# Patient Record
Sex: Female | Born: 1940 | Race: White | Hispanic: No | Marital: Married | Smoking: Never smoker
Health system: Southern US, Community
[De-identification: ages and names within clinical notes are randomized; demographics above are authoritative.]

## PROBLEM LIST (undated history)

## (undated) DIAGNOSIS — R001 Bradycardia, unspecified: Secondary | ICD-10-CM

## (undated) DIAGNOSIS — E785 Hyperlipidemia, unspecified: Secondary | ICD-10-CM

## (undated) DIAGNOSIS — N950 Postmenopausal bleeding: Secondary | ICD-10-CM

## (undated) DIAGNOSIS — K8050 Calculus of bile duct without cholangitis or cholecystitis without obstruction: Secondary | ICD-10-CM

## (undated) DIAGNOSIS — I447 Left bundle-branch block, unspecified: Secondary | ICD-10-CM

## (undated) DIAGNOSIS — E669 Obesity, unspecified: Secondary | ICD-10-CM

## (undated) DIAGNOSIS — Z973 Presence of spectacles and contact lenses: Secondary | ICD-10-CM

## (undated) DIAGNOSIS — I509 Heart failure, unspecified: Secondary | ICD-10-CM

## (undated) DIAGNOSIS — H269 Unspecified cataract: Secondary | ICD-10-CM

## (undated) DIAGNOSIS — I4819 Other persistent atrial fibrillation: Secondary | ICD-10-CM

## (undated) DIAGNOSIS — N840 Polyp of corpus uteri: Secondary | ICD-10-CM

## (undated) DIAGNOSIS — I2699 Other pulmonary embolism without acute cor pulmonale: Secondary | ICD-10-CM

## (undated) DIAGNOSIS — Z86010 Personal history of colonic polyps: Secondary | ICD-10-CM

## (undated) DIAGNOSIS — I82409 Acute embolism and thrombosis of unspecified deep veins of unspecified lower extremity: Secondary | ICD-10-CM

## (undated) DIAGNOSIS — I440 Atrioventricular block, first degree: Secondary | ICD-10-CM

## (undated) DIAGNOSIS — I480 Paroxysmal atrial fibrillation: Secondary | ICD-10-CM

## (undated) DIAGNOSIS — R011 Cardiac murmur, unspecified: Secondary | ICD-10-CM

## (undated) DIAGNOSIS — R7301 Impaired fasting glucose: Secondary | ICD-10-CM

## (undated) DIAGNOSIS — I4510 Unspecified right bundle-branch block: Secondary | ICD-10-CM

## (undated) DIAGNOSIS — I4892 Unspecified atrial flutter: Secondary | ICD-10-CM

## (undated) DIAGNOSIS — K859 Acute pancreatitis without necrosis or infection, unspecified: Secondary | ICD-10-CM

## (undated) DIAGNOSIS — E039 Hypothyroidism, unspecified: Secondary | ICD-10-CM

## (undated) DIAGNOSIS — K5790 Diverticulosis of intestine, part unspecified, without perforation or abscess without bleeding: Secondary | ICD-10-CM

## (undated) DIAGNOSIS — Q438 Other specified congenital malformations of intestine: Secondary | ICD-10-CM

## (undated) DIAGNOSIS — I517 Cardiomegaly: Secondary | ICD-10-CM

## (undated) HISTORY — PX: PATELLA FRACTURE SURGERY: SHX735

## (undated) HISTORY — DX: Hyperlipidemia, unspecified: E78.5

## (undated) HISTORY — DX: Calculus of bile duct without cholangitis or cholecystitis without obstruction: K80.50

## (undated) HISTORY — DX: Other persistent atrial fibrillation: I48.19

## (undated) HISTORY — DX: Diverticulosis of intestine, part unspecified, without perforation or abscess without bleeding: K57.90

## (undated) HISTORY — DX: Impaired fasting glucose: R73.01

## (undated) HISTORY — DX: Left bundle-branch block, unspecified: I44.7

## (undated) HISTORY — DX: Other specified congenital malformations of intestine: Q43.8

## (undated) HISTORY — DX: Personal history of colonic polyps: Z86.010

## (undated) HISTORY — PX: JOINT REPLACEMENT: SHX530

## (undated) HISTORY — DX: Cardiac murmur, unspecified: R01.1

## (undated) HISTORY — DX: Unspecified cataract: H26.9

## (undated) HISTORY — PX: CATARACT EXTRACTION: SUR2

## (undated) HISTORY — PX: TONSILLECTOMY: SUR1361

## (undated) HISTORY — DX: Acute pancreatitis without necrosis or infection, unspecified: K85.90

---

## 1993-12-25 HISTORY — PX: TIBIA FRACTURE SURGERY: SHX806

## 1993-12-25 HISTORY — PX: SHOULDER OPEN ROTATOR CUFF REPAIR: SHX2407

## 1995-12-26 HISTORY — PX: TOTAL KNEE ARTHROPLASTY: SHX125

## 1995-12-26 HISTORY — PX: CHOLECYSTECTOMY: SHX55

## 2000-03-22 ENCOUNTER — Encounter: Payer: Self-pay | Admitting: Internal Medicine

## 2000-03-22 ENCOUNTER — Encounter: Admission: RE | Admit: 2000-03-22 | Discharge: 2000-03-22 | Payer: Self-pay | Admitting: Internal Medicine

## 2000-12-25 DIAGNOSIS — K5790 Diverticulosis of intestine, part unspecified, without perforation or abscess without bleeding: Secondary | ICD-10-CM

## 2000-12-25 DIAGNOSIS — Z860100 Personal history of colon polyps, unspecified: Secondary | ICD-10-CM

## 2000-12-25 HISTORY — PX: COLONOSCOPY: SHX174

## 2000-12-25 HISTORY — DX: Personal history of colon polyps, unspecified: Z86.0100

## 2000-12-25 HISTORY — DX: Diverticulosis of intestine, part unspecified, without perforation or abscess without bleeding: K57.90

## 2000-12-25 HISTORY — DX: Personal history of colonic polyps: Z86.010

## 2001-04-25 ENCOUNTER — Encounter: Payer: Self-pay | Admitting: Internal Medicine

## 2001-04-25 ENCOUNTER — Encounter: Admission: RE | Admit: 2001-04-25 | Discharge: 2001-04-25 | Payer: Self-pay | Admitting: *Deleted

## 2001-07-12 ENCOUNTER — Encounter (INDEPENDENT_AMBULATORY_CARE_PROVIDER_SITE_OTHER): Payer: Self-pay | Admitting: Specialist

## 2001-07-12 ENCOUNTER — Other Ambulatory Visit: Admission: RE | Admit: 2001-07-12 | Discharge: 2001-07-12 | Payer: Self-pay | Admitting: Gastroenterology

## 2001-07-30 ENCOUNTER — Encounter: Payer: Self-pay | Admitting: Gastroenterology

## 2001-07-30 ENCOUNTER — Ambulatory Visit (HOSPITAL_COMMUNITY): Admission: RE | Admit: 2001-07-30 | Discharge: 2001-07-30 | Payer: Self-pay | Admitting: Gastroenterology

## 2002-10-21 ENCOUNTER — Encounter: Admission: RE | Admit: 2002-10-21 | Discharge: 2002-10-21 | Payer: Self-pay | Admitting: Internal Medicine

## 2002-10-21 ENCOUNTER — Encounter: Payer: Self-pay | Admitting: Internal Medicine

## 2003-11-04 ENCOUNTER — Encounter: Admission: RE | Admit: 2003-11-04 | Discharge: 2003-11-04 | Payer: Self-pay | Admitting: Internal Medicine

## 2003-12-01 ENCOUNTER — Encounter: Payer: Self-pay | Admitting: Internal Medicine

## 2004-11-16 ENCOUNTER — Emergency Department (HOSPITAL_COMMUNITY): Admission: EM | Admit: 2004-11-16 | Discharge: 2004-11-17 | Payer: Self-pay | Admitting: Emergency Medicine

## 2004-11-28 ENCOUNTER — Encounter: Admission: RE | Admit: 2004-11-28 | Discharge: 2004-11-28 | Payer: Self-pay | Admitting: Internal Medicine

## 2004-11-28 ENCOUNTER — Ambulatory Visit: Payer: Self-pay | Admitting: Internal Medicine

## 2005-11-29 ENCOUNTER — Ambulatory Visit: Payer: Self-pay | Admitting: Internal Medicine

## 2005-12-19 ENCOUNTER — Encounter: Admission: RE | Admit: 2005-12-19 | Discharge: 2005-12-19 | Payer: Self-pay | Admitting: Internal Medicine

## 2006-09-25 ENCOUNTER — Ambulatory Visit: Payer: Self-pay | Admitting: Gastroenterology

## 2006-10-09 ENCOUNTER — Ambulatory Visit: Payer: Self-pay | Admitting: Gastroenterology

## 2006-12-03 ENCOUNTER — Ambulatory Visit: Payer: Self-pay | Admitting: Internal Medicine

## 2006-12-03 LAB — CONVERTED CEMR LAB
ALT: 17 units/L (ref 0–40)
AST: 23 units/L (ref 0–37)
Albumin: 4 g/dL (ref 3.5–5.2)
Alkaline Phosphatase: 80 units/L (ref 39–117)
BUN: 22 mg/dL (ref 6–23)
Basophils Absolute: 0 10*3/uL (ref 0.0–0.1)
Basophils Relative: 0.3 % (ref 0.0–1.0)
CO2: 27 meq/L (ref 19–32)
Calcium: 9.2 mg/dL (ref 8.4–10.5)
Chloride: 105 meq/L (ref 96–112)
Chol/HDL Ratio, serum: 4.2
Cholesterol: 202 mg/dL (ref 0–200)
Creatinine, Ser: 0.8 mg/dL (ref 0.4–1.2)
Creatinine,U: 208.7 mg/dL
Eosinophil percent: 2.5 % (ref 0.0–5.0)
Free T4: 0.8 ng/dL — ABNORMAL LOW (ref 0.9–1.8)
GFR calc non Af Amer: 76 mL/min
Glomerular Filtration Rate, Af Am: 92 mL/min/{1.73_m2}
Glucose, Bld: 82 mg/dL (ref 70–99)
HCT: 41.2 % (ref 36.0–46.0)
HDL: 48.6 mg/dL (ref >39.0)
Hemoglobin: 13.8 g/dL (ref 12.0–15.0)
Hgb A1c MFr Bld: 5.5 % (ref 4.6–6.0)
LDL DIRECT: 157.5 mg/dL
Lymphocytes Relative: 14.1 % (ref 12.0–46.0)
MCHC: 33.4 g/dL (ref 30.0–36.0)
MCV: 89.3 fL (ref 78.0–100.0)
Microalb Creat Ratio: 3.8 mg/g (ref 0.0–30.0)
Microalb, Ur: 0.8 mg/dL (ref 0.0–1.9)
Monocytes Absolute: 0.3 10*3/uL (ref 0.2–0.7)
Monocytes Relative: 4.1 % (ref 3.0–11.0)
Neutro Abs: 5.9 10*3/uL (ref 1.4–7.7)
Neutrophils Relative %: 79 % — ABNORMAL HIGH (ref 43.0–77.0)
Platelets: 355 10*3/uL (ref 150–400)
Potassium: 3.7 meq/L (ref 3.5–5.1)
RBC: 4.61 M/uL (ref 3.87–5.11)
RDW: 12.3 % (ref 11.5–14.6)
Sodium: 141 meq/L (ref 135–145)
TSH: 3.62 microintl units/mL (ref 0.35–5.50)
Total Bilirubin: 0.9 mg/dL (ref 0.3–1.2)
Total Protein: 7.7 g/dL (ref 6.0–8.3)
Triglyceride fasting, serum: 56 mg/dL (ref 0–149)
VLDL: 11 mg/dL (ref 0–40)
WBC: 7.4 10*3/uL (ref 4.5–10.5)

## 2006-12-20 ENCOUNTER — Encounter: Admission: RE | Admit: 2006-12-20 | Discharge: 2006-12-20 | Payer: Self-pay | Admitting: Internal Medicine

## 2007-12-04 ENCOUNTER — Ambulatory Visit: Payer: Self-pay | Admitting: Internal Medicine

## 2007-12-17 ENCOUNTER — Encounter (INDEPENDENT_AMBULATORY_CARE_PROVIDER_SITE_OTHER): Payer: Self-pay | Admitting: *Deleted

## 2007-12-23 ENCOUNTER — Encounter: Admission: RE | Admit: 2007-12-23 | Discharge: 2007-12-23 | Payer: Self-pay | Admitting: Internal Medicine

## 2007-12-26 HISTORY — PX: TOTAL KNEE ARTHROPLASTY: SHX125

## 2007-12-31 ENCOUNTER — Encounter: Admission: RE | Admit: 2007-12-31 | Discharge: 2007-12-31 | Payer: Self-pay | Admitting: Internal Medicine

## 2008-01-03 ENCOUNTER — Encounter (INDEPENDENT_AMBULATORY_CARE_PROVIDER_SITE_OTHER): Payer: Self-pay | Admitting: *Deleted

## 2008-03-13 ENCOUNTER — Ambulatory Visit: Payer: Self-pay | Admitting: Internal Medicine

## 2008-03-13 DIAGNOSIS — E785 Hyperlipidemia, unspecified: Secondary | ICD-10-CM

## 2008-03-13 DIAGNOSIS — R7989 Other specified abnormal findings of blood chemistry: Secondary | ICD-10-CM | POA: Insufficient documentation

## 2008-03-13 DIAGNOSIS — M1990 Unspecified osteoarthritis, unspecified site: Secondary | ICD-10-CM | POA: Insufficient documentation

## 2008-03-13 DIAGNOSIS — H5702 Anisocoria: Secondary | ICD-10-CM | POA: Insufficient documentation

## 2008-03-15 LAB — CONVERTED CEMR LAB
ALT: 23 units/L (ref 0–35)
AST: 24 units/L (ref 0–37)
Albumin: 4.2 g/dL (ref 3.5–5.2)
Alkaline Phosphatase: 69 units/L (ref 39–117)
BUN: 23 mg/dL (ref 6–23)
Basophils Absolute: 0.1 10*3/uL (ref 0.0–0.1)
Basophils Relative: 0.7 % (ref 0.0–1.0)
Bilirubin, Direct: 0.1 mg/dL (ref 0.0–0.3)
CO2: 27 meq/L (ref 19–32)
Calcium: 9.4 mg/dL (ref 8.4–10.5)
Chloride: 108 meq/L (ref 96–112)
Cholesterol: 186 mg/dL (ref 0–200)
Creatinine, Ser: 0.7 mg/dL (ref 0.4–1.2)
Eosinophils Absolute: 0.2 10*3/uL (ref 0.0–0.6)
Eosinophils Relative: 2.6 % (ref 0.0–5.0)
Free T4: 0.9 ng/dL (ref 0.6–1.6)
GFR calc Af Amer: 107 mL/min
GFR calc non Af Amer: 89 mL/min
Glucose, Bld: 105 mg/dL — ABNORMAL HIGH (ref 70–99)
HCT: 38.6 % (ref 36.0–46.0)
HDL: 52.9 mg/dL (ref >39.0)
Hemoglobin: 12.9 g/dL (ref 12.0–15.0)
Hgb A1c MFr Bld: 5.5 % (ref 4.6–6.0)
LDL Cholesterol: 119 mg/dL — ABNORMAL HIGH (ref 0–99)
Lymphocytes Relative: 14.5 % (ref 12.0–46.0)
MCHC: 33.4 g/dL (ref 30.0–36.0)
MCV: 89.6 fL (ref 78.0–100.0)
Monocytes Absolute: 0.5 10*3/uL (ref 0.2–0.7)
Monocytes Relative: 6.4 % (ref 3.0–11.0)
Neutro Abs: 6 10*3/uL (ref 1.4–7.7)
Neutrophils Relative %: 75.8 % (ref 43.0–77.0)
Platelets: 339 10*3/uL (ref 150–400)
Potassium: 4 meq/L (ref 3.5–5.1)
RBC: 4.31 M/uL (ref 3.87–5.11)
RDW: 13.9 % (ref 11.5–14.6)
Sodium: 141 meq/L (ref 135–145)
TSH: 3.51 microintl units/mL (ref 0.35–5.50)
Total Bilirubin: 0.6 mg/dL (ref 0.3–1.2)
Total CHOL/HDL Ratio: 3.5
Total Protein: 7.3 g/dL (ref 6.0–8.3)
Triglycerides: 72 mg/dL (ref 0–149)
VLDL: 14 mg/dL (ref 0–40)
WBC: 8 10*3/uL (ref 4.5–10.5)

## 2008-03-16 ENCOUNTER — Encounter (INDEPENDENT_AMBULATORY_CARE_PROVIDER_SITE_OTHER): Payer: Self-pay | Admitting: *Deleted

## 2008-03-31 ENCOUNTER — Telehealth: Payer: Self-pay | Admitting: Internal Medicine

## 2008-04-01 ENCOUNTER — Ambulatory Visit: Payer: Self-pay | Admitting: Internal Medicine

## 2008-04-03 LAB — CONVERTED CEMR LAB: Vit D, 1,25-Dihydroxy: 30 (ref 30–89)

## 2008-04-06 ENCOUNTER — Encounter (INDEPENDENT_AMBULATORY_CARE_PROVIDER_SITE_OTHER): Payer: Self-pay | Admitting: *Deleted

## 2008-04-27 ENCOUNTER — Encounter: Payer: Self-pay | Admitting: Internal Medicine

## 2008-08-03 ENCOUNTER — Inpatient Hospital Stay (HOSPITAL_COMMUNITY): Admission: RE | Admit: 2008-08-03 | Discharge: 2008-08-06 | Payer: Self-pay | Admitting: Orthopedic Surgery

## 2008-08-28 ENCOUNTER — Ambulatory Visit: Payer: Self-pay

## 2008-10-26 ENCOUNTER — Ambulatory Visit: Payer: Self-pay | Admitting: Obstetrics and Gynecology

## 2008-10-26 ENCOUNTER — Encounter: Payer: Self-pay | Admitting: Obstetrics and Gynecology

## 2008-10-26 ENCOUNTER — Other Ambulatory Visit: Admission: RE | Admit: 2008-10-26 | Discharge: 2008-10-26 | Payer: Self-pay | Admitting: Obstetrics and Gynecology

## 2008-10-26 ENCOUNTER — Telehealth (INDEPENDENT_AMBULATORY_CARE_PROVIDER_SITE_OTHER): Payer: Self-pay | Admitting: *Deleted

## 2008-12-23 ENCOUNTER — Encounter: Admission: RE | Admit: 2008-12-23 | Discharge: 2008-12-23 | Payer: Self-pay | Admitting: Internal Medicine

## 2008-12-29 ENCOUNTER — Encounter: Admission: RE | Admit: 2008-12-29 | Discharge: 2008-12-29 | Payer: Self-pay | Admitting: Internal Medicine

## 2008-12-31 ENCOUNTER — Encounter: Payer: Self-pay | Admitting: Internal Medicine

## 2009-03-02 ENCOUNTER — Ambulatory Visit: Payer: Self-pay | Admitting: Internal Medicine

## 2009-03-02 DIAGNOSIS — G479 Sleep disorder, unspecified: Secondary | ICD-10-CM

## 2009-03-02 DIAGNOSIS — E559 Vitamin D deficiency, unspecified: Secondary | ICD-10-CM

## 2009-03-08 ENCOUNTER — Encounter (INDEPENDENT_AMBULATORY_CARE_PROVIDER_SITE_OTHER): Payer: Self-pay | Admitting: *Deleted

## 2009-03-08 LAB — CONVERTED CEMR LAB
ALT: 14 units/L (ref 0–35)
AST: 20 units/L (ref 0–37)
Albumin: 4.1 g/dL (ref 3.5–5.2)
Alkaline Phosphatase: 60 units/L (ref 39–117)
BUN: 17 mg/dL (ref 6–23)
Basophils Absolute: 0 10*3/uL (ref 0.0–0.1)
Basophils Relative: 0.1 % (ref 0.0–3.0)
Bilirubin, Direct: 0.1 mg/dL (ref 0.0–0.3)
CO2: 30 meq/L (ref 19–32)
Calcium: 9.6 mg/dL (ref 8.4–10.5)
Chloride: 105 meq/L (ref 96–112)
Cholesterol: 190 mg/dL (ref 0–200)
Creatinine, Ser: 0.7 mg/dL (ref 0.4–1.2)
Eosinophils Absolute: 0.1 10*3/uL (ref 0.0–0.7)
Eosinophils Relative: 2.4 % (ref 0.0–5.0)
GFR calc Af Amer: 107 mL/min
GFR calc non Af Amer: 88 mL/min
Glucose, Bld: 101 mg/dL — ABNORMAL HIGH (ref 70–99)
HCT: 39.4 % (ref 36.0–46.0)
HDL: 51.4 mg/dL (ref >39.0)
Hemoglobin: 13.4 g/dL (ref 12.0–15.0)
LDL Cholesterol: 120 mg/dL — ABNORMAL HIGH (ref 0–99)
Lymphocytes Relative: 17.4 % (ref 12.0–46.0)
MCHC: 34.1 g/dL (ref 30.0–36.0)
MCV: 90.3 fL (ref 78.0–100.0)
Monocytes Absolute: 0.4 10*3/uL (ref 0.1–1.0)
Monocytes Relative: 6.4 % (ref 3.0–12.0)
Neutro Abs: 4.4 10*3/uL (ref 1.4–7.7)
Neutrophils Relative %: 73.7 % (ref 43.0–77.0)
Platelets: 309 10*3/uL (ref 150–400)
Potassium: 4.4 meq/L (ref 3.5–5.1)
RBC: 4.36 M/uL (ref 3.87–5.11)
RDW: 12.8 % (ref 11.5–14.6)
Sodium: 142 meq/L (ref 135–145)
TSH: 3.51 microintl units/mL (ref 0.35–5.50)
Total Bilirubin: 1 mg/dL (ref 0.3–1.2)
Total CHOL/HDL Ratio: 3.7
Total Protein: 7.3 g/dL (ref 6.0–8.3)
Triglycerides: 91 mg/dL (ref 0–149)
VLDL: 18 mg/dL (ref 0–40)
Vit D, 25-Hydroxy: 43 ng/mL (ref 30–89)
WBC: 5.9 10*3/uL (ref 4.5–10.5)

## 2009-12-27 ENCOUNTER — Encounter: Admission: RE | Admit: 2009-12-27 | Discharge: 2009-12-27 | Payer: Self-pay | Admitting: Internal Medicine

## 2010-04-04 ENCOUNTER — Ambulatory Visit: Payer: Self-pay | Admitting: Family Medicine

## 2010-04-04 ENCOUNTER — Encounter: Payer: Self-pay | Admitting: Internal Medicine

## 2010-05-02 ENCOUNTER — Ambulatory Visit: Payer: Self-pay | Admitting: Internal Medicine

## 2010-05-02 DIAGNOSIS — K5730 Diverticulosis of large intestine without perforation or abscess without bleeding: Secondary | ICD-10-CM | POA: Insufficient documentation

## 2010-05-02 LAB — CONVERTED CEMR LAB
Cholesterol, target level: 200 mg/dL
HDL goal, serum: 40 mg/dL
LDL Goal: 130 mg/dL

## 2010-05-03 LAB — CONVERTED CEMR LAB: Vit D, 25-Hydroxy: 56 ng/mL (ref 30–89)

## 2010-05-08 LAB — CONVERTED CEMR LAB
ALT: 16 units/L (ref 0–35)
AST: 21 units/L (ref 0–37)
Albumin: 4.4 g/dL (ref 3.5–5.2)
Alkaline Phosphatase: 79 units/L (ref 39–117)
BUN: 26 mg/dL — ABNORMAL HIGH (ref 6–23)
Basophils Absolute: 0 10*3/uL (ref 0.0–0.1)
Basophils Relative: 0.5 % (ref 0.0–3.0)
Bilirubin, Direct: 0.1 mg/dL (ref 0.0–0.3)
CO2: 30 meq/L (ref 19–32)
Calcium: 9.7 mg/dL (ref 8.4–10.5)
Chloride: 104 meq/L (ref 96–112)
Cholesterol: 209 mg/dL — ABNORMAL HIGH (ref 0–200)
Creatinine, Ser: 0.7 mg/dL (ref 0.4–1.2)
Direct LDL: 130 mg/dL
Eosinophils Absolute: 0.2 10*3/uL (ref 0.0–0.7)
Eosinophils Relative: 2.2 % (ref 0.0–5.0)
GFR calc non Af Amer: 82.49 mL/min (ref >60)
Glucose, Bld: 98 mg/dL (ref 70–99)
HCT: 40 % (ref 36.0–46.0)
HDL: 62.3 mg/dL (ref >39.00)
Hemoglobin: 13.4 g/dL (ref 12.0–15.0)
Lymphocytes Relative: 18.4 % (ref 12.0–46.0)
Lymphs Abs: 1.3 10*3/uL (ref 0.7–4.0)
MCHC: 33.6 g/dL (ref 30.0–36.0)
MCV: 91.9 fL (ref 78.0–100.0)
Monocytes Absolute: 0.3 10*3/uL (ref 0.1–1.0)
Monocytes Relative: 4.3 % (ref 3.0–12.0)
Neutro Abs: 5.4 10*3/uL (ref 1.4–7.7)
Neutrophils Relative %: 74.6 % (ref 43.0–77.0)
Platelets: 367 10*3/uL (ref 150.0–400.0)
Potassium: 5 meq/L (ref 3.5–5.1)
RBC: 4.35 M/uL (ref 3.87–5.11)
RDW: 13.5 % (ref 11.5–14.6)
Sodium: 143 meq/L (ref 135–145)
TSH: 3.25 microintl units/mL (ref 0.35–5.50)
Total Bilirubin: 0.5 mg/dL (ref 0.3–1.2)
Total CHOL/HDL Ratio: 3
Total Protein: 7.6 g/dL (ref 6.0–8.3)
Triglycerides: 52 mg/dL (ref 0.0–149.0)
VLDL: 10.4 mg/dL (ref 0.0–40.0)
WBC: 7.2 10*3/uL (ref 4.5–10.5)

## 2010-10-10 ENCOUNTER — Telehealth: Payer: Self-pay | Admitting: Internal Medicine

## 2010-11-21 ENCOUNTER — Ambulatory Visit: Payer: Self-pay | Admitting: Internal Medicine

## 2010-11-21 ENCOUNTER — Telehealth: Payer: Self-pay | Admitting: Internal Medicine

## 2010-11-21 LAB — CONVERTED CEMR LAB
BUN: 17 mg/dL (ref 6–23)
CO2: 26 meq/L (ref 19–32)
Calcium: 9.1 mg/dL (ref 8.4–10.5)
Chloride: 104 meq/L (ref 96–112)
Creatinine, Ser: 0.8 mg/dL (ref 0.4–1.2)
GFR calc non Af Amer: 77.5 mL/min (ref >60)
Glucose, Bld: 102 mg/dL — ABNORMAL HIGH (ref 70–99)
Potassium: 4.3 meq/L (ref 3.5–5.1)
Sodium: 141 meq/L (ref 135–145)

## 2010-11-22 ENCOUNTER — Ambulatory Visit: Payer: Self-pay | Admitting: Internal Medicine

## 2010-11-22 DIAGNOSIS — R17 Unspecified jaundice: Secondary | ICD-10-CM | POA: Insufficient documentation

## 2010-11-23 LAB — CONVERTED CEMR LAB
ALT: 15 units/L (ref 0–35)
AST: 19 units/L (ref 0–37)
Albumin: 3.8 g/dL (ref 3.5–5.2)
Alkaline Phosphatase: 80 units/L (ref 39–117)
Basophils Absolute: 0 10*3/uL (ref 0.0–0.1)
Basophils Relative: 0.4 % (ref 0.0–3.0)
Bilirubin, Direct: 0.2 mg/dL (ref 0.0–0.3)
Eosinophils Absolute: 0.1 10*3/uL (ref 0.0–0.7)
Eosinophils Relative: 1.6 % (ref 0.0–5.0)
HCT: 38.8 % (ref 36.0–46.0)
Hemoglobin: 13.1 g/dL (ref 12.0–15.0)
Lymphocytes Relative: 17.9 % (ref 12.0–46.0)
Lymphs Abs: 1.3 10*3/uL (ref 0.7–4.0)
MCHC: 33.9 g/dL (ref 30.0–36.0)
MCV: 89.6 fL (ref 78.0–100.0)
Monocytes Absolute: 0.6 10*3/uL (ref 0.1–1.0)
Monocytes Relative: 8.1 % (ref 3.0–12.0)
Neutro Abs: 5.2 10*3/uL (ref 1.4–7.7)
Neutrophils Relative %: 72 % (ref 43.0–77.0)
Platelets: 324 10*3/uL (ref 150.0–400.0)
RBC: 4.33 M/uL (ref 3.87–5.11)
RDW: 13.6 % (ref 11.5–14.6)
Total Bilirubin: 0.4 mg/dL (ref 0.3–1.2)
Total Protein: 6.8 g/dL (ref 6.0–8.3)
WBC: 7.3 10*3/uL (ref 4.5–10.5)

## 2010-12-28 ENCOUNTER — Encounter
Admission: RE | Admit: 2010-12-28 | Discharge: 2010-12-28 | Payer: Self-pay | Source: Home / Self Care | Attending: Internal Medicine | Admitting: Internal Medicine

## 2010-12-28 LAB — HM MAMMOGRAPHY

## 2011-01-15 ENCOUNTER — Encounter: Payer: Self-pay | Admitting: Internal Medicine

## 2011-01-24 NOTE — Progress Notes (Signed)
Summary: Anti-malaria  Phone Note Call from Patient Call back at Home Phone [phone]   Caller: Patient Summary of Call: pt called about an anti-malaria drug. please contact pt. she said this is an rx. Initial call taken by: Lavell Islam,  October 10, 2010 1:03 PM  Follow-up for Phone Call        Patient was notified by her pharmacy that it was Lariam 250mg , and she will need 9 pills so she can take one per week. Patient will be going to [place].  She uses [facility]. Ok to provide, please advise. Lucious Groves CMA  October 10, 2010 4:39 PM   Additional Follow-up for Phone Call Additional follow up Details #1::        see Rx Additional Follow-up by: Marga Melnick MD,  October 10, 2010 5:44 PM    Additional Follow-up for Phone Call Additional follow up Details #2::    Faxed to pharmacy while patient was there. Follow-up by: Lucious Groves CMA,  October 11, 2010 2:17 PM  New/Updated Medications: MEFLOQUINE HCL 250 MG TABS (MEFLOQUINE HCL) 1 weekly beginning 1 week prior to arrival & for 4 weeks after leaving endemic area Prescriptions: MEFLOQUINE HCL 250 MG TABS (MEFLOQUINE HCL) 1 weekly beginning 1 week prior to arrival & for 4 weeks after leaving endemic area  #9 x 0   Entered and Authorized by:   Marga Melnick MD   Signed by:   Marga Melnick MD on 10/10/2010   Method used:   Printed then faxed to ...       [facility] [id number]* (retail)       [address] [place], [place]  [address]       Ph: [phone]       Fax: [phone]   RxID:   [id number]
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
## 2011-01-24 NOTE — Assessment & Plan Note (Signed)
Summary: ACUTE DIARRHEA AFTER TRAVELING TO [place]   Vital Signs:  Patient profile:   70 year old female Temp:     97.6 degrees F Pulse rate:   60 / minute Resp:     17 per minute BP supine:   124 / 80  Vitals Entered By: Shonna Chock CMA (November 22, 2010 11:44 AM) CC: Diarrhea: patient recently traveled to [place] and now with diarrhea    CC:  Diarrhea: patient recently traveled to [place] and now with diarrhea .  History of Present Illness: Diarrhea      This is a 70 year old woman who presents with Diarrhea intermittently (X 4) since 10/25/2010 . First 2 episodes were while in [place]; the last episodes since  return to [place] 11/21.  The patient reports up to 25  stools per day, watery/unformed stools, "moderately" voluminous stools, fecal urgency, fecal soiling, nocturnal diarrhea, fasting diarrhea, and abrupt onset of symptoms. She  denies blood in stool, mucus in stool, greasy stools, malodorous stools, alternating diarrhea/constipation, bloating, and gassiness.  Associated symptoms include weight loss of 13 #.  The patient denies fever, abdominal pain, abdominal cramps, nausea, vomiting, lightheadedness, increased thirst, joint pains, mouth ulcers, and eye redness.  The symptoms are better with antibiotics. She took 4 courses of Cipro 500 mg (bid X 2 days then 1 daily) . last 2& 1/2 day  course was completed 11/28.  Patient's risk factors for diarrhea include eating  [place]  food and international travel.  Patient has  no   history of  GI disease other than incidentally noted diverticulosis @ colonoscopy.  Current Medications (verified): 1)  Asa 81mg  2)  Ambien 10  Mg  Tabs (Zolpidem Tartrate) .Marland Kitchen.. 1 By Mouth Every 3rd Night As Needed 3)  Vitamin D3 1000 Unit Tabs (Cholecalciferol) .Marland Kitchen.. 1 By Mouth Two Times A Day 4)  Multivitamins  Tabs (Multiple Vitamin) .Marland Kitchen.. 1 By Mouth Once Daily  Allergies: 1)  ! Pcn 2)  ! Demerol  Physical Exam  General:  well-nourished,in no acute distress;  alert,appropriate and cooperative throughout examination Eyes:  No corneal or conjunctival inflammation noted.? faint icterus Mouth:  Oral mucosa and oropharynx without lesions or exudates.  Teeth in good repair.  No pharyngeal erythema.  Tongue moist Lungs:  Normal respiratory effort, chest expands symmetrically. Lungs are clear to auscultation, no crackles or wheezes. Heart:  normal rate, regular rhythm, no gallop, no rub, no JVD, no HJR, and grade 1 /6 systolic murmur LSB.   Abdomen:  Bowel sounds  hyperactivwe,abdomen soft and non-tender without masses, organomegaly or hernias noted. Extremities:  No clubbing, cyanosis, edema. Neurologic:  alert & oriented X3.   Skin:  Intact without suspicious lesions or rashes. No jaundice.Some tenting  Cervical Nodes:  No lymphadenopathy noted Axillary Nodes:  No palpable lymphadenopathy Psych:  memory intact for recent and remote, normally interactive, and good eye contact.     Impression & Recommendations:  Problem # 1:  DIARRHEA (ICD-787.91)  post international travel  Orders: Venipuncture (16109) TLB-CBC Platelet - w/Differential (85025-CBCD) TLB-Hepatic/Liver Function Pnl (80076-HEPATIC)  Problem # 2:  ICTERUS (ICD-782.4)  questionable  Orders: Venipuncture (60454) TLB-CBC Platelet - w/Differential (85025-CBCD) TLB-Hepatic/Liver Function Pnl (80076-HEPATIC)  Complete Medication List: 1)  Asa 81mg   2)  Ambien 10 Mg Tabs (zolpidem Tartrate)  .Marland Kitchen.. 1 by mouth every 3rd night as needed 3)  Vitamin D3 1000 Unit Tabs (Cholecalciferol) .Marland Kitchen.. 1 by mouth two times a day 4)  Multivitamins Tabs (Multiple vitamin) .Marland KitchenMarland KitchenMarland Kitchen  1 by mouth once daily 5)  Metronidazole 500 Mg Tabs (Metronidazole) .Marland Kitchen.. 1 three times a day 6)  Ciprofloxacin Hcl 500 Mg Tabs (Ciprofloxacin hcl) .Marland Kitchen.. 1 two times a day  Patient Instructions: 1)  Fill Rx for Lomotil if needed.Drink clear liquids only for the next 24 hours, then slowly add other liquids and food as you   tolerate them. Align daily until bowels are normal. Prescriptions: AMBIEN 10  MG  TABS (ZOLPIDEM TARTRATE) 1 by mouth every 3rd night as needed  #30 x 3   Entered and Authorized by:   Marga Melnick MD   Signed by:   Marga Melnick MD on 11/22/2010   Method used:   Print then Give to Patient   RxID:   [id number] CIPROFLOXACIN HCL 500 MG TABS (CIPROFLOXACIN HCL) 1 two times a day  #14 x 0   Entered and Authorized by:   Marga Melnick MD   Signed by:   Marga Melnick MD on 11/22/2010   Method used:   Faxed to ...       [facility]* (retail)       [address]       [place], [place]  [id number]       Ph: [phone]       Fax: [phone]   RxID:   [id number] METRONIDAZOLE 500 MG TABS (METRONIDAZOLE) 1 three times a day  #21 x 0   Entered and Authorized by:   Marga Melnick MD   Signed by:   Marga Melnick MD on 11/22/2010   Method used:   Faxed to ...       [facility]* (retail)       [address]       [place], [place]  [id number]       Ph: [phone]       Fax: [phone]   RxID:   [id number]    Orders Added: 1)  Est. Patient Level IV [85462] 2)  Venipuncture [70350] 3)  TLB-CBC Platelet - w/Differential [85025-CBCD] 4)  TLB-Hepatic/Liver Function Pnl [80076-HEPATIC]

## 2011-01-24 NOTE — Progress Notes (Signed)
Summary: diarrhea  Phone Note Call from Patient Call back at Home Phone [phone]   Summary of Call: Patient called stating that she has gone to [place] and since has been having terrible bouts of diarrhea. She has started her third bout of diarrhea and has taken her husbands cipro with the previous two bouts. Patient would like something called in, I made her aware this may require office visit. Please advise. Initial call taken by: Lucious Groves CMA,  November 21, 2010 9:05 AM  Follow-up for Phone Call        Per MD the patient needs: office visit with bmet, stool check-- o&p and culture.  Patient notified and will come today for lab visit and scheduled office visit for tomorrow. Follow-up by: Lucious Groves CMA,  November 21, 2010 9:48 AM

## 2011-01-24 NOTE — Miscellaneous (Signed)
Summary: BONE DENSITY  Clinical Lists Changes  Orders: Added new Test order of T-Lumbar Vertebral Assessment (77082) - Signed Added new Test order of T-Bone Densitometry (77080) - Signed 

## 2011-02-02 ENCOUNTER — Encounter: Payer: Self-pay | Admitting: Internal Medicine

## 2011-02-21 NOTE — Medication Information (Signed)
Summary: Exception Request for Diphen/Atrop  Exception Request for Diphen/Atrop   Imported By: Maryln Gottron 02/14/2011 15:11:18  _____________________________________________________________________  External Attachment:    Type:   Image     Comment:   External Document

## 2011-05-09 NOTE — Op Note (Signed)
[name], [name]               ACCOUNT NO.:  [id number]   MEDICAL RECORD NO.:  [id number]          PATIENT TYPE:  INP   LOCATION:  5004                         FACILITY:  [facility]   PHYSICIAN:  Elana Alm. Thurston Hole, M.D. DATE OF BIRTH:  [birth year]   DATE OF PROCEDURE:  08/03/2008  DATE OF DISCHARGE:                               OPERATIVE REPORT   PREOPERATIVE DIAGNOSIS:  Left knee degenerative joint disease.   POSTOPERATIVE DIAGNOSIS:  Left knee degenerative joint disease.   PROCEDURE:  Left total knee replacement using DePuy cemented total knee  system with #3 cemented femur, #4 cemented tibia with 10-mm polyethylene  RP tibial spacer and 35-mm polyethylene cemented patella.   SURGEON:  Elana Alm. Thurston Hole, MD   ASSISTANT:  Julien Girt, PA   ANESTHESIA:  General.   OPERATIVE TIME:  1 hour 20 minutes.   COMPLICATIONS:  None.   DESCRIPTION OF PROCEDURE:  [name] was brought to the operating  room on August 03, 2008, after femoral nerve block was placed in the  holding area by Anesthesia.  She was placed on the operative table in  supine position.  She received vancomycin 1 g IV preoperatively for  prophylaxis.  After being placed under general anesthesia, she had a  Foley catheter placed under sterile conditions.  Her left knee was  examined.  Range of motion from -8 to 100 degrees.  Knee stable  ligamentous exam with moderate varus deformity and normal patellar  tracking.  The left leg was prepped using sterile DuraPrep and draped  using sterile technique.  The leg was exsanguinated and the thigh-high  tourniquet elevated at 365 mm.  Initially, through a 15-cm longitudinal  incision based over the patella, initial exposure was made.  The  underlying subcutaneous tissues were incised along the skin incision.  A  median arthrotomy was performed revealing an excessive amount of  normally-appearing joint fluid.  The articular surfaces were inspected.  She had grade 4  changes medially and grade 3 and 4 changes laterally,  and grade 3 and 4 changes in the patellofemoral joint.  Osteophytes were  removed from the femoral condyles and tibial plateau.  The medial and  lateral meniscal remnants were removed as well as the anterior cruciate  ligament.  An intramedullary drill was then drilled up the femoral canal  for placement of distal femoral cutting jig, which was placed in the  appropriate manner of rotation and then distally 13 mm cut was made  based on the appropriate manner of external rotation.  The distal femur  was incised.  The #3 was found be the appropriate size.  The #3 cutting  jig was placed in the appropriate manner of external rotation and these  cuts were made.  After this was done, the proximal tibia was exposed.  The tibial spines were removed with an oscillating saw.  Intramedullary  drill was then drilled down the tibial canal for placement of proximal  tibial cutting jig, which was placed in the appropriate manner of  rotation and a proximal 6-mm cut was made based off the medial or  lower  side.  Spacer blocks were then placed in flexion and extension.  The 10-  mm blocks gave excellent balancing, excellent stability and excellent  correction of her flexion and varus deformities.  The #4 tibial base  plate trial was placed on the cut tibial surface with an excellent fit  and the keel cut was made.  The PCL box cutter was then placed under the  distal femur and these cuts were made.  After this was done with #3  femoral trial and #4 tibial base plate trial, a 04-VW polyethylene RP  tibial spacer was found to be excellent stability and excellent  correction of her flexion and varus deformities with excellent range of  motion 0-125 degrees with normal patellar tracking.  A patellar  resurfacing 8-mm cut was made and 3 locking holes placed for 35-mm  patella.  The patellar trial was placed, again patellofemoral tracking  was evaluated,  and found to be normal.  After this was done, it was felt  that all the trial components were of excellent size, fit, and  stability.  They were then removed.  The knee was then jet lavage  irrigated with 3 liters of saline.  The proximal tibia was then exposed.  No. 4 tibial baseplate with cement backing was hammered into position  with an excellent fit with excess cement being removed from around the  edges.  No. 3 femoral component with cement backing was hammered into  position also with an excellent fit with excess cement being removed  from around the edges.  The 10-mm polyethylene RP tibial spacer was  placed on tibial baseplate.  The knee reduced, taken through full range  of motion 0-125 degrees with excellent stability and excellent  correction of her flexion and varus deformities.  The 35-mm polyethylene  cement backed patella was then placed in its position and held there  with a clamp.  After the cement hardened, again patellofemoral tracking  was evaluated and found to be normal.  At this point, it was felt that  all the components were of excellent size, fit, and stability.  The  wound was further irrigated with saline.  The tourniquet was released.  Hemostasis was obtained with cautery.  The arthrotomy was then closed  with #1 Ethilon suture over two-medium Hemovac drains.  Subcutaneous  tissues was closed with 0 and 2-0 Vicryl, subcuticular layer closed with  4-0 Monocryl.  Sterile dressings and a long-leg splint applied.  The  patient then awakened, extubated, and taken to recovery room in stable  condition.  Needle and sponge counts were correct x2 at the end the  case.  Neurovascular status normal postoperatively as well.      Robert A. Thurston Hole, M.D.  Electronically Signed     RAW/MEDQ  D:  08/03/2008  T:  08/03/2008  Job:  [id number]

## 2011-05-10 ENCOUNTER — Encounter: Payer: Self-pay | Admitting: Internal Medicine

## 2011-05-11 ENCOUNTER — Encounter: Payer: Self-pay | Admitting: Internal Medicine

## 2011-05-11 ENCOUNTER — Telehealth: Payer: Self-pay | Admitting: Gastroenterology

## 2011-05-11 ENCOUNTER — Ambulatory Visit (INDEPENDENT_AMBULATORY_CARE_PROVIDER_SITE_OTHER): Payer: Medicare Other | Admitting: Internal Medicine

## 2011-05-11 VITALS — BP 114/68 | HR 72 | Temp 97.6°F | Resp 14 | Ht 66.5 in | Wt 194.0 lb

## 2011-05-11 DIAGNOSIS — R197 Diarrhea, unspecified: Secondary | ICD-10-CM

## 2011-05-11 DIAGNOSIS — E559 Vitamin D deficiency, unspecified: Secondary | ICD-10-CM

## 2011-05-11 DIAGNOSIS — E785 Hyperlipidemia, unspecified: Secondary | ICD-10-CM

## 2011-05-11 DIAGNOSIS — J209 Acute bronchitis, unspecified: Secondary | ICD-10-CM

## 2011-05-11 DIAGNOSIS — Z0000 Encounter for general adult medical examination without abnormal findings: Secondary | ICD-10-CM

## 2011-05-11 DIAGNOSIS — Z0100 Encounter for examination of eyes and vision without abnormal findings: Secondary | ICD-10-CM

## 2011-05-11 LAB — BASIC METABOLIC PANEL
BUN: 18 mg/dL (ref 6–23)
CO2: 29 mEq/L (ref 19–32)
Calcium: 9.1 mg/dL (ref 8.4–10.5)
Chloride: 104 mEq/L (ref 96–112)
GFR: 84.89 mL/min (ref >60.00)
Glucose, Bld: 84 mg/dL (ref 70–99)
Potassium: 4.2 mEq/L (ref 3.5–5.1)
Sodium: 140 mEq/L (ref 135–145)

## 2011-05-11 LAB — LIPID PANEL
Cholesterol: 142 mg/dL (ref 0–200)
LDL Cholesterol: 95 mg/dL (ref 0–99)
Triglycerides: 63 mg/dL (ref 0.0–149.0)
VLDL: 12.6 mg/dL (ref 0.0–40.0)

## 2011-05-11 LAB — CBC WITH DIFFERENTIAL/PLATELET
Basophils Absolute: 0 10*3/uL (ref 0.0–0.1)
Basophils Relative: 0.6 % (ref 0.0–3.0)
Eosinophils Absolute: 0.3 10*3/uL (ref 0.0–0.7)
Eosinophils Relative: 3.5 % (ref 0.0–5.0)
HCT: 36.2 % (ref 36.0–46.0)
Hemoglobin: 12.1 g/dL (ref 12.0–15.0)
Lymphocytes Relative: 13.5 % (ref 12.0–46.0)
Lymphs Abs: 1.1 10*3/uL (ref 0.7–4.0)
MCV: 89.3 fl (ref 78.0–100.0)
Monocytes Absolute: 0.5 10*3/uL (ref 0.1–1.0)
Monocytes Relative: 5.8 % (ref 3.0–12.0)
Neutro Abs: 6 10*3/uL (ref 1.4–7.7)
Neutrophils Relative %: 76.6 % (ref 43.0–77.0)
Platelets: 379 10*3/uL (ref 150.0–400.0)
RBC: 4.05 Mil/uL (ref 3.87–5.11)
RDW: 13.3 % (ref 11.5–14.6)

## 2011-05-11 LAB — HEPATIC FUNCTION PANEL
ALT: 14 U/L (ref 0–35)
AST: 17 U/L (ref 0–37)
Bilirubin, Direct: 0.1 mg/dL (ref 0.0–0.3)
Total Bilirubin: 0.4 mg/dL (ref 0.3–1.2)
Total Protein: 6.4 g/dL (ref 6.0–8.3)

## 2011-05-11 MED ORDER — AZITHROMYCIN 250 MG PO TABS: 250.0000 mg | ORAL_TABLET | Freq: Every day | ORAL | Status: DC

## 2011-05-11 NOTE — Telephone Encounter (Signed)
Patient has been scheduled to see Mike Gip PA at 2:00 on Monday

## 2011-05-11 NOTE — Progress Notes (Signed)
Subjective:    Patient ID: [name], female    DOB: Apr 21, 1941, 70 y.o.   MRN: [id number]  HPI Medicare Wellness Visit:  The following psychosocial & medical history were reviewed as required by Medicare.   Social history: caffeine: all decaf , alcohol:  < 1 drink / day ,  tobacco use : never  & exercise :  Water aerobics & treadmill for . 60 min.   Home & personal  safety / fall risk: no, activities of daily living: no , seatbelt use : yes , and smoke alarm employment : yes .  Power of Attorney/Living Will status : in place  Vision ( as recorded per Nurse) & Hearing  evaluation :  Whisper heard @ 6 ft. Orientation :oriented X 3 , memory & recall : normal, spelling : normal,and mood & affect : normal . Depression / anxiety:  No issues Travel history : [place] 10-10/2010 , immunization status :shingles needed , transfusion history:  no, and preventive health surveillance ( colonoscopies, BMD , etc as per protocol/ [facility]): Colonoscopy due 09/2011, Dental care:   Seen every 6 mos . Chart reviewed &  Updated. Active issues reviewed & addressed.       Review of Systems DIARRHEA Onset: originally in 10/2010;symptoms 11/11-01/12. She had exacerbation for 5 days 2 weeks ag; this resolved w/o Rx.    Description: frank diarrhea Number of stools per day: > 10/day  Previous treatment: yes, Cipro  Vomiting: no  Abdominal Pain: yes, only with diarrhea  Weight loss: yes, 13.5 #  Decreased urine output: no  Lightheadedness: no  Suspicious food or water: yes,while  in [place]  [employer] Fever: no  Bloody stools: no  Recent antibiotics: no  Immuncompromised: no    Respiratory tract infection Onset/symptoms:05/10 as ST Exposures (illness/environmental/extrinsic):no Progression of symptoms:cough with yellow sputum as of 05/13 Treatments/response:Robitussin Fever/chills/sweats:no Frontal headache:no Facial pain:no Nasal purulence:no Sore throat: resolved Dental  pain:no Lymphadenopathy:no Wheezing/shortness of breath:no Pleuritic pain:no Associated extrinsic/allergic symptoms:itchy eyes/ sneezing:no             Objective:   Physical Exam Gen.: Healthy and well-nourished in appearance. Alert, appropriate and cooperative throughout exam. Head: Normocephalic without obvious abnormalities Eyes: No corneal or conjunctival inflammation noted. Pupils  Asymmetric.Fundal exam is benign without hemorrhages, exudate, papilledema. Extraocular motion intact. Vision grossly normal. Ears: External  ear exam reveals no significant lesions or deformities. Canals clear .TMs normal. Hearing is grossly normal bilaterally. Nose: External nasal exam reveals no deformity or inflammation. Nasal mucosa are  Slightly dry . No lesions or exudates noted.   Mouth: Oral mucosa and oropharynx reveal no lesions or exudates. Teeth in good repair. Neck: No deformities, masses, or tenderness noted. Range of motion &. Thyroid  normal. Lungs: Normal respiratory effort; chest expands symmetrically. Lungs are clear to auscultation without rales, wheezes, or increased work of breathing.Slightly loose cough Heart: Normal rate and rhythm. Normal S1 and S2. No gallop, click, or rub. Grade 1/2 -1 systolic. murmur. Abdomen: Bowel sounds normal; abdomen soft and nontender. No masses, organomegaly or hernias noted. Genitalia:  Dr Eda Paschal.  Musculoskeletal/extremities: No deformity or scoliosis noted of  the thoracic or lumbar spine. No clubbing, cyanosis, edema, or deformity noted. Range of motion abnormal @ knees , R.L .Tone & strength  normal.Joints normal. Nail health  good. Vascular: Carotid, radial artery, dorsalis pedis and dorsalis posterior tibial pulses are full and equal. No bruits present. Neurologic: Alert and oriented x3. Deep tendon reflexes symmetrical and normal.         Skin: Intact without  suspicious lesions or rashes. Lymph: No cervical, axillary  lymphadenopathy present. Psych: Mood and affect are normal. Normally interactive                                                                                          Assessment & Plan:  #1 Medicare wellness visit; criteria met and data entered  #2 bronchitis, acute  #3 diarrhea, intermittent and recurrent. This is in the context of travel to [place]  with acute illness in November 2011  Plan: #1 Zithromax should adequately treat the bronchitis with minimal impact on the gut. Align  will be taken daily while on antibiotics.  #2 GI referral to assess and recurrent diarrhea; or concerns possible parasitic infestation  #3 see orders and recommendations.

## 2011-05-11 NOTE — Patient Instructions (Signed)
Preventive Health Care: Exercise  30-45  minutes a day, 3-4 days a week. Walking is especially valuable in preventing Osteoporosis. Eat a low-fat diet with lots of fruits and vegetables, up to 7-9 servings per day. Avoid obesity; your goal = waist less than 35 inches.Consume less than 30 grams of sugar per day from foods & drinks with High Fructose Corn Syrup as #2,3 or #4 on label. Take Align daily while on Zithromax.

## 2011-05-11 NOTE — Assessment & Plan Note (Signed)
NMR Lipoprofile 2004: LDL 122 (1240/469), HDL 51, Tg 67. LDL goal = < 130.No premature CAD.

## 2011-05-12 LAB — VITAMIN D 25 HYDROXY (VIT D DEFICIENCY, FRACTURES): Vit D, 25-Hydroxy: 60 ng/mL (ref 30–89)

## 2011-05-12 NOTE — Discharge Summary (Signed)
[name]               ACCOUNT NO.:  [id number]   MEDICAL RECORD NO.:  [id number]          PATIENT TYPE:  INP   LOCATION:  5004                         FACILITY:  [facility]   PHYSICIAN:  Elana Alm. Thurston Hole, M.D. DATE OF BIRTH:  [birth year]   DATE OF ADMISSION:  08/03/2008  DATE OF DISCHARGE:  08/06/2008                               DISCHARGE SUMMARY   ADMITTING DIAGNOSES:  1. End-stage degenerative joint disease, left knee.  2. History of pancreatitis.  3. History of femur fracture.  4. History of a right total knee replacement.  5. History of cholecystectomy.   DISCHARGE DIAGNOSES:  1. End-stage degenerative joint disease left knee, status post left      total knee replacement.  2. Postop blood loss anemia.  3. History of femur fracture.  4. History of right total knee replacement.  5. History of cholecystectomy.  6. History of pancreatitis.   HISTORY OF PRESENT ILLNESS:  The patient is a 70 year old white female  with a history of end-stage DJD of her left knee.  She has failed  conservative care including anti-inflammatories and intraarticular  cortisone injections.  Risk, benefits, and possible complications of  left total knee replacement were discussed with the patient.  She is  without question.   PROCEDURES IN-HOUSE:  On August 03, 2008, the patient underwent a left  total knee replacement by Dr. Wyline Mood, a left femoral nerve block by  anesthesia, and Autovac transfusion.  She tolerated all of these  procedures well without difficulty.  She was placed in the CPM 0-75  degrees and tolerated this well.  She received 700 mL of blood postop  transfusion.  On postop day #1, hemoglobin was 9.6 and INR was 1.1.  She  was metabolically stable.  Celebrex was added for pain control.  Her PCA  was discontinued.  Her Foley was discontinued.  Her O2 was weaned.  Home  health was arranged.  She ambulated 30 feet with physical therapy.  On  postop day #2, the patient continued  to improve with physical therapy  ambulated 80 feet.  INR was 1.3.  Surgical wound was well approximated.  Hemoglobin was 8.8. Bowel sounds were positive.  She was metabolically  stable.  Postop day #3, the patient significantly improved.  Her  hemoglobin was 8.5.  She was completely asymptomatic.  Urine culture  showed no urinary tract infection.  Surgical wound was well  approximated.  She was discharged to home, weightbearing as tolerated,  on a regular diet with a home CPM unit 0-90 degrees 8 hours a day, home  physical therapy, and home occupational therapy.   DISCHARGE MEDICATIONS:  1. Percocet 5/325 one to two q. 4-6 h. p.r.n. pain.  2. Robaxin 500 mg 1 q.6 h. p.r.n. muscle spasm.  3. Coumadin 5 mg daily.  4. Lovenox 30 mg one injection subcu q. 12 until INR 2.0 or greater.  5. Colace 100 mg 1 tablet twice a day.  6. Senokot-S 2 tablets before dinner.  7. Celebrex 200 mg 1 tablet daily.  8. Centrum Silver 1 tablet daily.  9. Vitamin  D 1000 units IU daily.  10.Aspirin 81 mg daily.   She will follow up with Dr. Wyline Mood on August 17, 2008, for stitch  removal.  She will call us with increased pain, increased swelling,  increased redness, or temperature greater than 101.      [name], P.A.      Robert A. Thurston Hole, M.D.  Electronically Signed    KS/MEDQ  D:  09/30/2008  T:  10/01/2008  Job:  [id number]

## 2011-05-15 ENCOUNTER — Encounter: Payer: Self-pay | Admitting: Physician Assistant

## 2011-05-15 ENCOUNTER — Ambulatory Visit (INDEPENDENT_AMBULATORY_CARE_PROVIDER_SITE_OTHER): Payer: Medicare Other | Admitting: Physician Assistant

## 2011-05-15 VITALS — BP 112/70 | HR 80 | Ht 66.5 in | Wt 195.4 lb

## 2011-05-15 DIAGNOSIS — Z86010 Personal history of colonic polyps: Secondary | ICD-10-CM

## 2011-05-15 DIAGNOSIS — R197 Diarrhea, unspecified: Secondary | ICD-10-CM

## 2011-05-15 NOTE — Progress Notes (Signed)
Subjective:    Patient ID: [name], female    DOB: 1941/06/15, 70 y.o.   MRN: [id number]  HPI Areas he very nice 70 year old white female known to Dr. Russella Dar  from prior colonoscopies. She had colonoscopy in 2002 with finding of left-sided diverticulosis and one sigmoid colon polyp which was adenomatous. She had followup colonoscopy in October of 2007 which was negative. She is referred today per Dr. Alwyn Ren for evaluation of persistent diarrhea. Patient relates that she had a trip to [place] in the fall of 2011 and became ill with diarrhea on her way home. She said she had persistent problems for about 6 weeks with urgent watery diarrhea, or numerous nonbloody stools and abdominal cramping. This was associated with fatigue lethargy and a decrease in appetite. She had still a lot of cultures done per Dr. Alwyn Ren all which were negative, and she was treated with Cipro and both during her trip in a bleed after her return. Patient relates that multiple other people who she traveled with had similar symptoms. She said she had been doing very well with no residual GI symptoms and then had a brief bout of diarrhea early in May 2012. She says this really was just 24 hours worth of diarrhea which was urgent and associated fatigue but has completely resolved at this time. She has no current GI symptoms denies any abdominal cramping no melena or hematochezia. Her appetite is good her weight has been stable She is not having any fevers or sweats and says that her bowel movements are back to normal and have been so other than poor that 48 hour period first week of May.  She has not had any antibiotics all winter, but he is on a current course of Zithromax. This most recent diarrheal episode preceded the Zithromax.    Review of Systems  Constitutional: Negative.   HENT: Negative.   Eyes: Negative.   Respiratory: Positive for cough.   Cardiovascular: Negative.   Gastrointestinal: Negative.   Genitourinary:  Negative.   Musculoskeletal: Negative.   Skin: Negative.   Neurological: Negative.   Hematological: Negative.   Psychiatric/Behavioral: Negative.    Outpatient Prescriptions Prior to Visit  Medication Sig Dispense Refill  . aspirin 81 MG tablet Take 81 mg by mouth daily.        . Multiple Vitamin (MULTIVITAMIN) tablet Take 1 tablet by mouth daily.        Marland Kitchen zolpidem (AMBIEN) 10 MG tablet Take 10 mg by mouth as directed. 1 by mouth every 3rd night as needed       . azithromycin (ZITHROMAX Z-PAK) 250 MG tablet Take 2 tablets by mouth on day 1, followed by 1 tablet by mouth daily for 4 days.  6 each  0  . Cholecalciferol (VITAMIN D3) 1000 UNITS CAPS Take by mouth 2 (two) times daily.             Objective:   Physical Exam Well-developed white female in no acute distress, pleasant, alert and oriented x3 HEENT; nontraumatic normocephalic EOMI PERRLA sclera anicteric  Neck; supple no JVD  Cardiovascular; regular rate and rhythm with S1-S2 no murmur rub or gallop   Pulmonary; clear bilaterally  Abdomen; soft nontender nondistended bowel sounds active, no palpable mass or hepatosplenomegaly  Rectal; not done  Extremities; benign skin warm and dry, without lesion  Psych; mood and affect normal an appropriate        Assessment & Plan:  #45 70O-year-old female with history of presumed infectious  diarrhea post travel to [place] fall 2011. Patient with brief self-limited episode of diarrhea approximately 2 weeks ago, completely resolved and asymptomatic.  Plan; No further GI workup indicated at this time  Patient will call should she have any recurrence of symptoms then would obtain stool cultures at that time.  #2 history of adenomatous colon polyps  Plan; Patient will be due for colonoscopy October 2012, we discussed this and she will call for appointment prior to that time.

## 2011-05-15 NOTE — Progress Notes (Signed)
Reviewed and agree with management. Arminta Gamm T. Tritia Endo MD FACG 

## 2011-05-16 ENCOUNTER — Encounter: Payer: Self-pay | Admitting: Gastroenterology

## 2011-05-16 ENCOUNTER — Encounter: Payer: Self-pay | Admitting: Internal Medicine
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
## 2011-06-19 ENCOUNTER — Ambulatory Visit: Payer: Medicare Other | Admitting: Gastroenterology

## 2011-07-05 ENCOUNTER — Encounter (INDEPENDENT_AMBULATORY_CARE_PROVIDER_SITE_OTHER): Payer: Medicare Other | Admitting: Obstetrics and Gynecology

## 2011-07-05 ENCOUNTER — Other Ambulatory Visit (HOSPITAL_COMMUNITY)
Admission: RE | Admit: 2011-07-05 | Discharge: 2011-07-05 | Disposition: A | Discharge status: Home / Self Care | Payer: Medicare Other | Source: Ambulatory Visit | Attending: Obstetrics and Gynecology | Admitting: Obstetrics and Gynecology

## 2011-07-05 ENCOUNTER — Other Ambulatory Visit: Payer: Self-pay | Admitting: Obstetrics and Gynecology

## 2011-07-05 DIAGNOSIS — N3941 Urge incontinence: Secondary | ICD-10-CM

## 2011-07-05 DIAGNOSIS — N950 Postmenopausal bleeding: Secondary | ICD-10-CM

## 2011-07-05 DIAGNOSIS — Z124 Encounter for screening for malignant neoplasm of cervix: Secondary | ICD-10-CM | POA: Insufficient documentation

## 2011-07-05 DIAGNOSIS — N952 Postmenopausal atrophic vaginitis: Secondary | ICD-10-CM

## 2011-07-05 DIAGNOSIS — R823 Hemoglobinuria: Secondary | ICD-10-CM

## 2011-07-13 ENCOUNTER — Ambulatory Visit (INDEPENDENT_AMBULATORY_CARE_PROVIDER_SITE_OTHER): Payer: Medicare Other | Admitting: Obstetrics and Gynecology

## 2011-07-13 ENCOUNTER — Other Ambulatory Visit: Payer: Medicare Other

## 2011-07-13 DIAGNOSIS — N840 Polyp of corpus uteri: Secondary | ICD-10-CM

## 2011-07-13 DIAGNOSIS — N950 Postmenopausal bleeding: Secondary | ICD-10-CM

## 2011-09-22 LAB — BASIC METABOLIC PANEL
BUN: 12
BUN: 15
CO2: 27
Calcium: 8.6
Chloride: 107
Creatinine, Ser: 0.61
Creatinine, Ser: 0.66
Creatinine, Ser: 0.69
GFR calc Af Amer: >60
GFR calc non Af Amer: >60
GFR calc non Af Amer: >60
Glucose, Bld: 103 — ABNORMAL HIGH
Glucose, Bld: 131 — ABNORMAL HIGH

## 2011-09-22 LAB — DIFFERENTIAL
Eosinophils Absolute: 0.1
Eosinophils Relative: 1
Lymphocytes Relative: 15
Lymphs Abs: 1.4
Monocytes Relative: 4

## 2011-09-22 LAB — TYPE AND SCREEN: DAT, IgG: NEGATIVE

## 2011-09-22 LAB — CBC
MCHC: 33.6
MCHC: 33.8
MCV: 91.3
Platelets: 181
Platelets: 187
Platelets: 193
Platelets: 318
RDW: 13.3
RDW: 13.5
RDW: 13.7
WBC: 5.9
WBC: 6.3

## 2011-09-22 LAB — URINALYSIS, MICROSCOPIC ONLY
Glucose, UA: NEGATIVE
Ketones, ur: NEGATIVE
Leukocytes, UA: NEGATIVE
Protein, ur: NEGATIVE

## 2011-09-22 LAB — URINALYSIS, ROUTINE W REFLEX MICROSCOPIC
Glucose, UA: NEGATIVE
Specific Gravity, Urine: 1.013
pH: 5

## 2011-09-22 LAB — COMPREHENSIVE METABOLIC PANEL
ALT: 18
AST: 20
Calcium: 9.5
Creatinine, Ser: 0.61
GFR calc Af Amer: >60
Sodium: 136
Total Protein: 7

## 2011-09-22 LAB — URINE MICROSCOPIC-ADD ON

## 2011-09-22 LAB — PROTIME-INR
INR: 1.1
Prothrombin Time: 14.6
Prothrombin Time: 14.7
Prothrombin Time: 16.3 — ABNORMAL HIGH

## 2011-09-22 LAB — URINE CULTURE: Colony Count: NO GROWTH

## 2011-09-22 LAB — HEMOGLOBIN AND HEMATOCRIT, BLOOD
HCT: 28 — ABNORMAL LOW
Hemoglobin: 9.4 — ABNORMAL LOW

## 2011-09-22 LAB — ABO/RH: ABO/RH(D): A POS

## 2011-11-24 ENCOUNTER — Encounter: Payer: Self-pay | Admitting: Gastroenterology

## 2011-11-27 ENCOUNTER — Other Ambulatory Visit: Payer: Self-pay | Admitting: Internal Medicine

## 2011-11-27 DIAGNOSIS — Z1231 Encounter for screening mammogram for malignant neoplasm of breast: Secondary | ICD-10-CM

## 2011-11-28 ENCOUNTER — Other Ambulatory Visit: Payer: Medicare Other | Admitting: Gastroenterology

## 2011-11-30 ENCOUNTER — Encounter: Payer: Medicare Other | Admitting: Gastroenterology

## 2011-12-26 DIAGNOSIS — I82409 Acute embolism and thrombosis of unspecified deep veins of unspecified lower extremity: Secondary | ICD-10-CM

## 2011-12-26 DIAGNOSIS — I2699 Other pulmonary embolism without acute cor pulmonale: Secondary | ICD-10-CM

## 2011-12-26 HISTORY — PX: COLONOSCOPY: SHX174

## 2011-12-26 HISTORY — DX: Acute embolism and thrombosis of unspecified deep veins of unspecified lower extremity: I82.409

## 2011-12-26 HISTORY — DX: Other pulmonary embolism without acute cor pulmonale: I26.99

## 2012-01-01 ENCOUNTER — Ambulatory Visit
Admission: RE | Admit: 2012-01-01 | Discharge: 2012-01-01 | Disposition: A | Discharge status: Home / Self Care | Payer: Medicare Other | Source: Ambulatory Visit | Attending: Internal Medicine | Admitting: Internal Medicine

## 2012-01-01 DIAGNOSIS — Z1231 Encounter for screening mammogram for malignant neoplasm of breast: Secondary | ICD-10-CM

## 2012-01-04 ENCOUNTER — Other Ambulatory Visit: Payer: Medicare Other | Admitting: Gastroenterology

## 2012-02-26 ENCOUNTER — Encounter: Payer: Self-pay | Admitting: Gastroenterology

## 2012-04-03 ENCOUNTER — Encounter: Payer: Self-pay | Admitting: Gastroenterology

## 2012-04-03 ENCOUNTER — Ambulatory Visit (AMBULATORY_SURGERY_CENTER): Payer: Medicare Other | Admitting: *Deleted

## 2012-04-03 VITALS — Ht 67.0 in | Wt 216.0 lb

## 2012-04-03 DIAGNOSIS — Z1211 Encounter for screening for malignant neoplasm of colon: Secondary | ICD-10-CM

## 2012-04-03 MED ORDER — MOVIPREP 100 G PO SOLR: ORAL | Status: DC

## 2012-04-17 ENCOUNTER — Encounter: Payer: Self-pay | Admitting: Gastroenterology

## 2012-04-17 ENCOUNTER — Ambulatory Visit (AMBULATORY_SURGERY_CENTER): Payer: Medicare Other | Admitting: Gastroenterology

## 2012-04-17 VITALS — BP 140/97 | HR 87 | Temp 98.1°F | Resp 18 | Ht 67.0 in | Wt 216.0 lb

## 2012-04-17 DIAGNOSIS — Z860100 Personal history of colon polyps, unspecified: Secondary | ICD-10-CM

## 2012-04-17 DIAGNOSIS — Z1211 Encounter for screening for malignant neoplasm of colon: Secondary | ICD-10-CM

## 2012-04-17 MED ORDER — SODIUM CHLORIDE 0.9 % IV SOLN: 500.0000 mL | INTRAVENOUS | Status: DC

## 2012-04-17 NOTE — Patient Instructions (Signed)
Discharge instructions given with verbal understanding. Handouts on diverticulosis and a high fiber diet given. Resume previous medications.YOU HAD AN ENDOSCOPIC PROCEDURE TODAY AT THE [facility]: Refer to the procedure report that was given to you for any specific questions about what was found during the examination.  If the procedure report does not answer your questions, please call your gastroenterologist to clarify.  If you requested that your care partner not be given the details of your procedure findings, then the procedure report has been included in a sealed envelope for you to review at your convenience later.  YOU SHOULD EXPECT: Some feelings of bloating in the abdomen. Passage of more gas than usual.  Walking can help get rid of the air that was put into your GI tract during the procedure and reduce the bloating. If you had a lower endoscopy (such as a colonoscopy or flexible sigmoidoscopy) you may notice spotting of blood in your stool or on the toilet paper. If you underwent a bowel prep for your procedure, then you may not have a normal bowel movement for a few days.  DIET: Your first meal following the procedure should be a light meal and then it is ok to progress to your normal diet.  A half-sandwich or bowl of soup is an example of a good first meal.  Heavy or fried foods are harder to digest and may make you feel nauseous or bloated.  Likewise meals heavy in dairy and vegetables can cause extra gas to form and this can also increase the bloating.  Drink plenty of fluids but you should avoid alcoholic beverages for 24 hours.  ACTIVITY: Your care partner should take you home directly after the procedure.  You should plan to take it easy, moving slowly for the rest of the day.  You can resume normal activity the day after the procedure however you should NOT DRIVE or use heavy machinery for 24 hours (because of the sedation medicines used during the test).    SYMPTOMS TO  REPORT IMMEDIATELY: A gastroenterologist can be reached at any hour.  During normal business hours, 8:30 AM to 5:00 PM Monday through Friday, call [phone].  After hours and on weekends, please call the GI answering service at [phone] who will take a message and have the physician on call contact you.   Following lower endoscopy (colonoscopy or flexible sigmoidoscopy):  Excessive amounts of blood in the stool  Significant tenderness or worsening of abdominal pains  Swelling of the abdomen that is new, acute  Fever of 100F or higher  FOLLOW UP: If any biopsies were taken you will be contacted by phone or by letter within the next 1-3 weeks.  Call your gastroenterologist if you have not heard about the biopsies in 3 weeks.  Our staff will call the home number listed on your records the next business day following your procedure to check on you and address any questions or concerns that you may have at that time regarding the information given to you following your procedure. This is a courtesy call and so if there is no answer at the home number and we have not heard from you through the emergency physician on call, we will assume that you have returned to your regular daily activities without incident.  SIGNATURES/CONFIDENTIALITY: You and/or your care partner have signed paperwork which will be entered into your electronic medical record.  These signatures attest to the fact that that the information above on your   After Visit Summary has been reviewed and is understood.  Full responsibility of the confidentiality of this discharge information lies with you and/or your care-partner.   

## 2012-04-17 NOTE — Progress Notes (Signed)
Patient did not experience any of the following events: a burn prior to discharge; a fall within the facility; wrong site/side/patient/procedure/implant event; or a hospital transfer or hospital admission upon discharge from the facility. (G8907) Patient did not have preoperative order for IV antibiotic SSI prophylaxis. (G8918)  

## 2012-04-17 NOTE — Op Note (Signed)
[name] [address] [place], [place]  [address]  COLONOSCOPY PROCEDURE REPORT  PATIENT:  Monica, Beck  MR#:  [id number] BIRTHDATE:  1941-04-03, 70 yrs. old  GENDER:  female ENDOSCOPIST:  Judie Petit T. Russella Dar, MD, [facility]  PROCEDURE DATE:  04/17/2012 PROCEDURE:  Colonoscopy 19147 ASA CLASS:  Class II INDICATIONS:  1) surveillance and high-risk screening  2) history of pre-cancerous (adenomatous) colon polyps: 2002 MEDICATIONS:   MAC sedation, administered by CRNA, propofol (Diprivan) 280 mg IV DESCRIPTION OF PROCEDURE:   After the risks benefits and alternatives of the procedure were thoroughly explained, informed consent was obtained.  Digital rectal exam was performed and revealed no abnormalities.   The LB [id number] endoscope was introduced through the anus and advanced to the cecum, which was identified by both the appendix and ileocecal valve, without limitations.  The quality of the prep was good, using MoviPrep. The instrument was then slowly withdrawn as the colon was fully examined. <<PROCEDUREIMAGES>> FINDINGS:  Moderate diverticulosis was found in the sigmoid to descending colon.  Otherwise normal colonoscopy without other polyps, masses, vascular ectasias, or inflammatory changes. Retroflexed views in the rectum revealed no abnormalities. The time to cecum =  4.5  minutes. The scope was then withdrawn (time =  9.75  min) from the patient and the procedure completed.  COMPLICATIONS:  None  ENDOSCOPIC IMPRESSION: 1) Moderate diverticulosis in the sigmoid to descending colon  RECOMMENDATIONS: 1) High fiber diet with liberal fluid intake. 2) Repeat Colonoscopy in 5 years.  Venita Lick. Russella Dar, MD, Clementeen Graham  n. eSIGNED:   Venita Lick. Laveah Gloster at 04/17/2012 09:22 AM  Lajoyce Corners, [phone]

## 2012-04-18 ENCOUNTER — Telehealth: Payer: Self-pay

## 2012-04-18 NOTE — Telephone Encounter (Signed)
  Follow up Call-  Call back number 04/17/2012  Post procedure Call Back phone  # [phone]  Permission to leave phone message Yes     Patient questions:  Do you have a fever, pain , or abdominal swelling? no Pain Score  0 *  Have you tolerated food without any problems? yes  Have you been able to return to your normal activities? yes  Do you have any questions about your discharge instructions: Diet   no Medications  no Follow up visit  no  Do you have questions or concerns about your Care? no  Actions: * If pain score is 4 or above: No action needed, pain <4.  Per the pt "everything was great".  She suggested to add to our pre-procedure instructions to coat your anus with vasoline before beginning the prep.  Maw

## 2012-05-14 ENCOUNTER — Encounter: Payer: Medicare Other | Admitting: Internal Medicine

## 2012-07-04 ENCOUNTER — Encounter: Payer: Self-pay | Admitting: Internal Medicine

## 2012-07-04 ENCOUNTER — Ambulatory Visit (INDEPENDENT_AMBULATORY_CARE_PROVIDER_SITE_OTHER): Payer: Medicare Other | Admitting: Internal Medicine

## 2012-07-04 VITALS — BP 132/80 | HR 70 | Temp 98.9°F | Resp 14 | Ht 65.08 in | Wt 227.8 lb

## 2012-07-04 DIAGNOSIS — E785 Hyperlipidemia, unspecified: Secondary | ICD-10-CM

## 2012-07-04 DIAGNOSIS — G479 Sleep disorder, unspecified: Secondary | ICD-10-CM

## 2012-07-04 DIAGNOSIS — Z0000 Encounter for general adult medical examination without abnormal findings: Secondary | ICD-10-CM

## 2012-07-04 DIAGNOSIS — E559 Vitamin D deficiency, unspecified: Secondary | ICD-10-CM

## 2012-07-04 DIAGNOSIS — Z86010 Personal history of colonic polyps: Secondary | ICD-10-CM

## 2012-07-04 NOTE — Progress Notes (Signed)
Subjective:    Patient ID: [name], female    DOB: 1941/02/18, 71 y.o.   MRN: [id number]  HPI Medicare Wellness Visit:  The following psychosocial & medical history were reviewed as required by Medicare.   Social history: caffeine: minimal , alcohol:  2-3 drinks / week ,  tobacco use : never  & exercise : 5X / week water aerobics. 3X/ week on treadmill @ 3.5 mph .   Home & personal  safety / fall risk: no issues activities of daily living: no limitations , seatbelt use : yes , and smoke alarm employment : yes .  Power of Attorney/Living Will status : in place  Vision ( as recorded per Nurse) & Hearing  evaluation :  Ophth exam appt in several weeks. Orientation :oriented X 3 , memory & recall :good, spelling  testing: good,and mood & affect : normal . Depression / anxiety: denied Travel history : [place] 2012 , immunization status :Shingles needed , transfusion history:  no, and preventive health surveillance ( colonoscopies, BMD , etc as per protocol/ [facility]): colonoscopy 2013, Dental care:  Seen every 6 mos . Chart reviewed &  Updated. Active issues reviewed & addressed.       Review of Systems  She is asymptomatic. Concerned about weight gain. She has joined [employer].  She has had chronic sleep issues. Zolpidem 10 mg every third night was associated with strange dreams; she's not taken this for 6 months. There is no history of excessive snoring , apnea, or restless leg syndrome     Objective:   Physical Exam Gen.: Healthy and well-nourished in appearance. Alert, appropriate and cooperative throughout exam.Appears younger than stated age  Head: Normocephalic without obvious abnormalities Eyes: No corneal or conjunctival inflammation noted. Pupils slightly unequal . Extraocular motion intact. Vision grossly decreased bilaterally. Ears: External  ear exam reveals no significant lesions or deformities. Canals clear .TMs normal. Hearing is grossly normal bilaterally. Nose:  External nasal exam reveals no deformity or inflammation. Nasal mucosa are pink and moist. No lesions or exudates noted.  Mouth: Oral mucosa and oropharynx reveal no lesions or exudates. Teeth in good repair. Neck: No deformities, masses, or tenderness noted. Range of motion & Thyroid normal. Lungs: Normal respiratory effort; chest expands symmetrically. Lungs are clear to auscultation without rales, wheezes, or increased work of breathing. Heart: Normal rate and rhythm. Normal S1 and S2. No gallop, click, or rub.No murmur. Abdomen: Bowel sounds normal; abdomen soft and nontender. No masses, organomegaly or hernias noted. Genitalia: Dr Eda Paschal Musculoskeletal/extremities: No deformity or scoliosis noted of  the thoracic or lumbar spine. No clubbing, cyanosis, edema, or deformity noted. Range of motion  Dramatically decreased R knee .Tone & strength  normal.Joints normal. Nail health  good. Vascular: Carotid, radial artery, dorsalis pedis and  posterior tibial pulses are full and equal. No bruits present. Neurologic: Alert and oriented x3. Deep tendon reflexes symmetrical and normal.          Skin: Intact without suspicious lesions or rashes. Lymph: No cervical, axillary lymphadenopathy present. Psych: Mood and affect are normal. Normally interactive  Assessment & Plan:  #1 Medicare Wellness Exam; criteria met ; data entered #2 Problem List reviewed ; Assessment/ Recommendations made Plan: see Orders

## 2012-07-04 NOTE — Patient Instructions (Addendum)
To prevent sleep dysfunction follow these instructions for sleep hygiene. Do not read, watch TV, or eat in bed. Do not get into bed until you are ready to turn off the light &  to go to sleep. Do not ingest stimulants ( decongestants, diet pills, nicotine, caffeine) after the evening meal.  Please try to go on My Chart within the next 24 hours to allow me to release the results directly to you.

## 2012-07-05 LAB — CBC WITH DIFFERENTIAL/PLATELET
Basophils Absolute: 0 10*3/uL (ref 0.0–0.1)
Eosinophils Absolute: 0.1 10*3/uL (ref 0.0–0.7)
HCT: 37.5 % (ref 36.0–46.0)
Hemoglobin: 12.3 g/dL (ref 12.0–15.0)
Lymphs Abs: 1.1 10*3/uL (ref 0.7–4.0)
MCHC: 32.9 g/dL (ref 30.0–36.0)
MCV: 91.9 fl (ref 78.0–100.0)
Monocytes Absolute: 0.2 10*3/uL (ref 0.1–1.0)
Neutro Abs: 4.8 10*3/uL (ref 1.4–7.7)
RDW: 13.6 % (ref 11.5–14.6)

## 2012-07-05 LAB — BASIC METABOLIC PANEL
BUN: 19 mg/dL (ref 6–23)
CO2: 26 mEq/L (ref 19–32)
Glucose, Bld: 79 mg/dL (ref 70–99)
Potassium: 4.1 mEq/L (ref 3.5–5.1)
Sodium: 139 mEq/L (ref 135–145)

## 2012-07-05 LAB — HEPATIC FUNCTION PANEL
AST: 35 U/L (ref 0–37)
Albumin: 4 g/dL (ref 3.5–5.2)

## 2012-07-05 LAB — TSH: TSH: 3.55 u[IU]/mL (ref 0.35–5.50)

## 2012-07-05 LAB — LIPID PANEL
HDL: 72.1 mg/dL (ref >39.00)
Triglycerides: 53 mg/dL (ref 0.0–149.0)
VLDL: 10.6 mg/dL (ref 0.0–40.0)

## 2012-07-05 LAB — VITAMIN D 25 HYDROXY (VIT D DEFICIENCY, FRACTURES): Vit D, 25-Hydroxy: 44 ng/mL (ref 30–89)

## 2012-07-06 NOTE — Assessment & Plan Note (Signed)
Sleep Specialty referral declined @ this time

## 2012-10-23 ENCOUNTER — Ambulatory Visit (INDEPENDENT_AMBULATORY_CARE_PROVIDER_SITE_OTHER): Payer: Medicare Other | Admitting: Internal Medicine

## 2012-10-23 ENCOUNTER — Inpatient Hospital Stay (HOSPITAL_COMMUNITY): Payer: Medicare Other

## 2012-10-23 ENCOUNTER — Encounter: Payer: Self-pay | Admitting: Internal Medicine

## 2012-10-23 ENCOUNTER — Emergency Department (HOSPITAL_COMMUNITY): Payer: Medicare Other

## 2012-10-23 ENCOUNTER — Inpatient Hospital Stay (HOSPITAL_COMMUNITY)
Admission: EM | Admit: 2012-10-23 | Discharge: 2012-10-29 | DRG: 175 | Disposition: A | Discharge status: Home / Self Care | Payer: Medicare Other | Attending: Internal Medicine | Admitting: Internal Medicine

## 2012-10-23 ENCOUNTER — Encounter (HOSPITAL_COMMUNITY): Payer: Self-pay | Admitting: Emergency Medicine

## 2012-10-23 ENCOUNTER — Telehealth: Payer: Self-pay | Admitting: Internal Medicine

## 2012-10-23 VITALS — BP 138/88 | HR 111 | Temp 97.3°F | Resp 16 | Wt 234.5 lb

## 2012-10-23 DIAGNOSIS — R0609 Other forms of dyspnea: Secondary | ICD-10-CM | POA: Insufficient documentation

## 2012-10-23 DIAGNOSIS — I509 Heart failure, unspecified: Secondary | ICD-10-CM

## 2012-10-23 DIAGNOSIS — R600 Localized edema: Secondary | ICD-10-CM

## 2012-10-23 DIAGNOSIS — R0989 Other specified symptoms and signs involving the circulatory and respiratory systems: Secondary | ICD-10-CM

## 2012-10-23 DIAGNOSIS — Z86010 Personal history of colonic polyps: Secondary | ICD-10-CM

## 2012-10-23 DIAGNOSIS — E876 Hypokalemia: Secondary | ICD-10-CM

## 2012-10-23 DIAGNOSIS — I50811 Acute right heart failure: Secondary | ICD-10-CM

## 2012-10-23 DIAGNOSIS — M1990 Unspecified osteoarthritis, unspecified site: Secondary | ICD-10-CM

## 2012-10-23 DIAGNOSIS — R17 Unspecified jaundice: Secondary | ICD-10-CM

## 2012-10-23 DIAGNOSIS — Z860101 Personal history of adenomatous and serrated colon polyps: Secondary | ICD-10-CM

## 2012-10-23 DIAGNOSIS — Z5181 Encounter for therapeutic drug level monitoring: Secondary | ICD-10-CM

## 2012-10-23 DIAGNOSIS — Z79899 Other long term (current) drug therapy: Secondary | ICD-10-CM

## 2012-10-23 DIAGNOSIS — J90 Pleural effusion, not elsewhere classified: Secondary | ICD-10-CM

## 2012-10-23 DIAGNOSIS — I4891 Unspecified atrial fibrillation: Secondary | ICD-10-CM

## 2012-10-23 DIAGNOSIS — I5033 Acute on chronic diastolic (congestive) heart failure: Secondary | ICD-10-CM | POA: Diagnosis present

## 2012-10-23 DIAGNOSIS — E785 Hyperlipidemia, unspecified: Secondary | ICD-10-CM

## 2012-10-23 DIAGNOSIS — K5730 Diverticulosis of large intestine without perforation or abscess without bleeding: Secondary | ICD-10-CM

## 2012-10-23 DIAGNOSIS — Z7901 Long term (current) use of anticoagulants: Secondary | ICD-10-CM

## 2012-10-23 DIAGNOSIS — H5702 Anisocoria: Secondary | ICD-10-CM

## 2012-10-23 DIAGNOSIS — G479 Sleep disorder, unspecified: Secondary | ICD-10-CM

## 2012-10-23 DIAGNOSIS — I2699 Other pulmonary embolism without acute cor pulmonale: Principal | ICD-10-CM

## 2012-10-23 DIAGNOSIS — I5023 Acute on chronic systolic (congestive) heart failure: Secondary | ICD-10-CM

## 2012-10-23 DIAGNOSIS — I824Y9 Acute embolism and thrombosis of unspecified deep veins of unspecified proximal lower extremity: Secondary | ICD-10-CM | POA: Diagnosis present

## 2012-10-23 DIAGNOSIS — E559 Vitamin D deficiency, unspecified: Secondary | ICD-10-CM

## 2012-10-23 DIAGNOSIS — Z7982 Long term (current) use of aspirin: Secondary | ICD-10-CM

## 2012-10-23 DIAGNOSIS — Z96659 Presence of unspecified artificial knee joint: Secondary | ICD-10-CM

## 2012-10-23 LAB — BASIC METABOLIC PANEL
CO2: 22 mEq/L (ref 19–32)
Chloride: 106 mEq/L (ref 96–112)
Creatinine, Ser: 0.8 mg/dL (ref 0.50–1.10)
Sodium: 141 mEq/L (ref 135–145)

## 2012-10-23 LAB — CBC WITH DIFFERENTIAL/PLATELET
Basophils Absolute: 0 10*3/uL (ref 0.0–0.1)
HCT: 42.4 % (ref 36.0–46.0)
Lymphocytes Relative: 17 % (ref 12–46)
Monocytes Absolute: 0.5 10*3/uL (ref 0.1–1.0)
Neutro Abs: 6.2 10*3/uL (ref 1.7–7.7)
RDW: 13.9 % (ref 11.5–15.5)
WBC: 8.2 10*3/uL (ref 4.0–10.5)

## 2012-10-23 LAB — PROTIME-INR
INR: 1.14 (ref 0.00–1.49)
Prothrombin Time: 14.4 seconds (ref 11.6–15.2)

## 2012-10-23 LAB — POCT I-STAT TROPONIN I: Troponin i, poc: 0.01 ng/mL (ref 0.00–0.08)

## 2012-10-23 LAB — PRO B NATRIURETIC PEPTIDE: Pro B Natriuretic peptide (BNP): 1269 pg/mL — ABNORMAL HIGH (ref 0–125)

## 2012-10-23 MED ORDER — ONDANSETRON HCL 4 MG/2ML IJ SOLN: 4.0000 mg | Freq: Three times a day (TID) | INTRAMUSCULAR | Status: AC | PRN

## 2012-10-23 MED ORDER — DEXTROSE 5 % IV SOLN
5.0000 mg/h | Freq: Once | INTRAVENOUS | Status: AC
  Administered 2012-10-23: 5 mg/h via INTRAVENOUS

## 2012-10-23 MED ORDER — HEPARIN BOLUS VIA INFUSION
4000.0000 [IU] | Freq: Once | INTRAVENOUS | Status: AC
  Administered 2012-10-23: 4000 [IU] via INTRAVENOUS

## 2012-10-23 MED ORDER — DILTIAZEM HCL 25 MG/5ML IV SOLN
15.0000 mg | Freq: Once | INTRAVENOUS | Status: AC
  Administered 2012-10-23: 15 mg via INTRAVENOUS

## 2012-10-23 MED ORDER — ASPIRIN EC 81 MG PO TBEC: 162.0000 mg | DELAYED_RELEASE_TABLET | Freq: Every day | ORAL | Status: DC

## 2012-10-23 MED ORDER — ASPIRIN EC 81 MG PO TBEC
162.0000 mg | DELAYED_RELEASE_TABLET | Freq: Every day | ORAL | Status: DC
  Administered 2012-10-24 – 2012-10-25 (×2): 162 mg via ORAL
  Filled 2012-10-23 (×3): qty 2

## 2012-10-23 MED ORDER — ONDANSETRON HCL 4 MG/2ML IJ SOLN: 4.0000 mg | Freq: Four times a day (QID) | INTRAMUSCULAR | Status: DC | PRN

## 2012-10-23 MED ORDER — FUROSEMIDE 10 MG/ML IJ SOLN
40.0000 mg | Freq: Once | INTRAMUSCULAR | Status: AC
  Administered 2012-10-23: 40 mg via INTRAVENOUS
  Filled 2012-10-23: qty 4

## 2012-10-23 MED ORDER — HEPARIN (PORCINE) IN NACL 100-0.45 UNIT/ML-% IJ SOLN
1050.0000 [IU]/h | INTRAMUSCULAR | Status: AC
  Administered 2012-10-23: 1200 [IU]/h via INTRAVENOUS
  Administered 2012-10-26: 1050 [IU]/h via INTRAVENOUS
  Filled 2012-10-23 (×7): qty 250

## 2012-10-23 MED ORDER — SODIUM CHLORIDE 0.9 % IV SOLN
250.0000 mL | INTRAVENOUS | Status: DC | PRN
  Administered 2012-10-24: 250 mL via INTRAVENOUS

## 2012-10-23 MED ORDER — FUROSEMIDE 10 MG/ML IJ SOLN
20.0000 mg | Freq: Every day | INTRAMUSCULAR | Status: DC
  Administered 2012-10-24: 20 mg via INTRAVENOUS
  Filled 2012-10-23: qty 2

## 2012-10-23 MED ORDER — SODIUM CHLORIDE 0.9 % IV BOLUS (SEPSIS)
250.0000 mL | Freq: Once | INTRAVENOUS | Status: AC
  Administered 2012-10-23: 250 mL via INTRAVENOUS

## 2012-10-23 MED ORDER — SODIUM CHLORIDE 0.9 % IJ SOLN
3.0000 mL | Freq: Two times a day (BID) | INTRAMUSCULAR | Status: DC
  Administered 2012-10-24 – 2012-10-28 (×9): 3 mL via INTRAVENOUS

## 2012-10-23 MED ORDER — DILTIAZEM HCL 100 MG IV SOLR
5.0000 mg/h | INTRAVENOUS | Status: DC
  Administered 2012-10-23: 5 mg/h via INTRAVENOUS
  Administered 2012-10-24: 15 mg/h via INTRAVENOUS
  Administered 2012-10-24: 5 mg/h via INTRAVENOUS
  Administered 2012-10-24 – 2012-10-25 (×2): 15 mg/h via INTRAVENOUS
  Administered 2012-10-25: 12.5 mg/h via INTRAVENOUS
  Filled 2012-10-23 (×4): qty 100

## 2012-10-23 MED ORDER — SODIUM CHLORIDE 0.9 % IJ SOLN
3.0000 mL | Freq: Two times a day (BID) | INTRAMUSCULAR | Status: DC
  Administered 2012-10-23: 3 mL via INTRAVENOUS

## 2012-10-23 MED ORDER — SODIUM CHLORIDE 0.9 % IJ SOLN: 3.0000 mL | INTRAMUSCULAR | Status: DC | PRN

## 2012-10-23 MED ORDER — SODIUM CHLORIDE 0.9 % IV SOLN
INTRAVENOUS | Status: AC
  Administered 2012-10-23: 20:00:00 via INTRAVENOUS

## 2012-10-23 MED ORDER — IOHEXOL 350 MG/ML SOLN
100.0000 mL | Freq: Once | INTRAVENOUS | Status: AC | PRN
  Administered 2012-10-23: 100 mL via INTRAVENOUS

## 2012-10-23 MED ORDER — ONDANSETRON HCL 4 MG PO TABS: 4.0000 mg | ORAL_TABLET | Freq: Four times a day (QID) | ORAL | Status: DC | PRN

## 2012-10-23 MED ORDER — ZOLPIDEM TARTRATE 5 MG PO TABS
5.0000 mg | ORAL_TABLET | Freq: Every evening | ORAL | Status: DC | PRN
  Administered 2012-10-27 (×2): 5 mg via ORAL
  Filled 2012-10-23 (×2): qty 1

## 2012-10-23 MED ORDER — ACETAMINOPHEN 325 MG PO TABS: 650.0000 mg | ORAL_TABLET | Freq: Four times a day (QID) | ORAL | Status: DC | PRN

## 2012-10-23 MED ORDER — ACETAMINOPHEN 650 MG RE SUPP: 650.0000 mg | Freq: Four times a day (QID) | RECTAL | Status: DC | PRN

## 2012-10-23 NOTE — Assessment & Plan Note (Signed)
She has new onset Afib and CHF, referred to ER for emergent intervention

## 2012-10-23 NOTE — Telephone Encounter (Signed)
Pt is scheduled for 11:15a at [facility] office. Pt is aware.

## 2012-10-23 NOTE — Patient Instructions (Signed)
Atrial Fibrillation  Your caregiver has diagnosed you with atrial fibrillation (AFib). The heart normally beats very regularly; AFib is a type of irregular heartbeat. The heart rate may be faster or slower than normal. This can prevent your heart from pumping as well as it should. AFib can be constant (chronic) or intermittent (paroxysmal).  CAUSES   Atrial fibrillation may be caused by:   Heart disease, including heart attack, coronary artery disease, heart failure, diseases of the heart valves, and others.   Blood clot in the lungs (pulmonary embolism).   Pneumonia or other infections.   Chronic lung disease.   Thyroid disease.   Toxins. These include alcohol, some medications (such as decongestant medications or diet pills), and caffeine.  In some people, no cause for AFib can be found. This is referred to as Lone Atrial Fibrillation.  SYMPTOMS    Palpitations or a fluttering in your chest.   A vague sense of chest discomfort.   Shortness of breath.   Sudden onset of lightheadedness or weakness.  Sometimes, the first sign of AFib can be a complication of the condition. This could be a stroke or heart failure.  DIAGNOSIS   Your description of your condition may make your caregiver suspicious of atrial fibrillation. Your caregiver will examine your pulse to determine if fibrillation is present. An EKG (electrocardiogram) will confirm the diagnosis. Further testing may help determine what caused you to have atrial fibrillation. This may include chest x-ray, echocardiogram, blood tests, or CT scans.  PREVENTION   If you have previously had atrial fibrillation, your caregiver may advise you to avoid substances known to cause the condition (such as stimulant medications, and possibly caffeine or alcohol). You may be advised to use medications to prevent recurrence. Proper treatment of any underlying condition is important to help prevent recurrence.  PROGNOSIS   Atrial fibrillation does tend to become a  chronic condition over time. It can cause significant complications (see below). Atrial fibrillation is not usually immediately life-threatening, but it can shorten your life expectancy. This seems to be worse in women. If you have lone atrial fibrillation and are under 60 years old, the risk of complications is very low, and life expectancy is not shortened.  RISKS AND COMPLICATIONS   Complications of atrial fibrillation can include stroke, chest pain, and heart failure. Your caregiver will recommend treatments for the atrial fibrillation, as well as for any underlying conditions, to help minimize risk of complications.  TREATMENT   Treatment for AFib is divided into several categories:   Treatment of any underlying condition.   Converting you out of AFib into a regular (sinus) rhythm.   Controlling rapid heart rate.   Prevention of blood clots and stroke.  Medications and procedures are available to convert your atrial fibrillation to sinus rhythm. However, recent studies have shown that this may not offer you any advantage, and cardiac experts are continuing research and debate on this topic.  More important is controlling your rapid heartbeat. The rapid heartbeat causes more symptoms, and places strain on your heart. Your caregiver will advise you on the use of medications that can control your heart rate.  Atrial fibrillation is a strong stroke risk. You can lessen this risk by taking blood thinning medications such as Coumadin (warfarin), or sometimes aspirin. These medications need close monitoring by your caregiver. Over-medication can cause bleeding. Too little medication may not protect against stroke.  HOME CARE INSTRUCTIONS    If your caregiver prescribed medicine to make   your heartbeat more normally, take as directed.   If blood thinners were prescribed by your caregiver, take EXACTLY as directed.   Perform blood tests EXACTLY as directed.   Quit smoking. Smoking increases your cardiac and lung  (pulmonary) risks.   DO NOT drink alcohol.   DO NOT drink caffeinated drinks (e.g. coffee, soda, chocolate, and leaf teas). You may drink decaffeinated coffee, soda or tea.   If you are overweight, you should choose a reduced calorie diet to lose weight. Please see a registered dietitian if you need more information about healthy weight loss. DO NOT USE DIET PILLS as they may aggravate heart problems.   If you have other heart problems that are causing AFib, you may need to eat a low salt, fat, and cholesterol diet. Your caregiver will tell you if this is necessary.   Exercise every day to improve your physical fitness. Stay active unless advised otherwise.   If your caregiver has given you a follow-up appointment, it is very important to keep that appointment. Not keeping the appointment could result in heart failure or stroke. If there is any problem keeping the appointment, you must call back to this facility for assistance.  SEEK MEDICAL CARE IF:   You notice a change in the rate, rhythm or strength of your heartbeat.   You develop an infection or any other change in your overall health status.  SEEK IMMEDIATE MEDICAL CARE IF:    You develop chest pain, abdominal pain, sweating, weakness or feel sick to your stomach (nausea).   You develop shortness of breath.   You develop swollen feet and ankles.   You develop dizziness, numbness, or weakness of your face or limbs, or any change in vision or speech.  MAKE SURE YOU:    Understand these instructions.   Will watch your condition.   Will get help right away if you are not doing well or get worse.  Document Released: 12/11/2005 Document Revised: 03/04/2012 Document Reviewed: 07/15/2008  [employer] Patient Information 2013 [employer].

## 2012-10-23 NOTE — Progress Notes (Signed)
Subjective:    Patient ID: [name], female    DOB: 06-10-1941, 71 y.o.   MRN: [id number]  Shortness of Breath This is a new problem. The current episode started 1 to 4 weeks ago. The problem occurs constantly. The problem has been rapidly worsening. The average episode lasts 10 days. Associated symptoms include leg swelling, orthopnea and PND. Pertinent negatives include no abdominal pain, chest pain, claudication, coryza, ear pain, fever, headaches, hemoptysis, leg pain, neck pain, rash, rhinorrhea, sore throat, sputum production, swollen glands, syncope, vomiting or wheezing. Nothing aggravates the symptoms. Risk factors include prolonged immobilization. She has tried nothing for the symptoms.      Review of Systems  Constitutional: Positive for diaphoresis, activity change, fatigue and unexpected weight change (10 pound weight gain). Negative for fever, chills and appetite change.  HENT: Negative.  Negative for ear pain, sore throat, rhinorrhea and neck pain.   Eyes: Negative.   Respiratory: Positive for shortness of breath. Negative for apnea, cough, hemoptysis, sputum production, choking, chest tightness, wheezing and stridor.   Cardiovascular: Positive for palpitations, orthopnea, leg swelling and PND. Negative for chest pain, claudication and syncope.  Gastrointestinal: Negative.  Negative for vomiting and abdominal pain.  Genitourinary: Negative.   Musculoskeletal: Negative.   Skin: Negative.  Negative for rash.  Neurological: Positive for dizziness and light-headedness. Negative for tremors, seizures, syncope, facial asymmetry, speech difficulty, weakness, numbness and headaches.  Hematological: Negative for adenopathy. Does not bruise/bleed easily.       Objective:   Physical Exam  Vitals reviewed. Constitutional: She is oriented to person, place, and time. She appears well-developed and well-nourished. No distress.  HENT:  Head: Normocephalic and atraumatic.    Mouth/Throat: No oropharyngeal exudate.  Eyes: Conjunctivae normal are normal. Right eye exhibits no discharge. Left eye exhibits no discharge. No scleral icterus.  Neck: Normal range of motion. Neck supple. JVD present. No tracheal deviation present. No thyromegaly present.  Cardiovascular: Intact distal pulses and normal pulses.  An irregularly irregular rhythm present. Tachycardia present.  Exam reveals gallop and S3.   No murmur heard. Pulmonary/Chest: Breath sounds normal. No stridor. She is in respiratory distress. She has no wheezes. She has no rales. She exhibits no tenderness.  Abdominal: Soft. Bowel sounds are normal. She exhibits no distension and no mass. There is no tenderness. There is no rebound and no guarding.  Musculoskeletal: She exhibits edema (1+ edema in BLE). She exhibits no tenderness.  Lymphadenopathy:    She has no cervical adenopathy.  Neurological: She is oriented to person, place, and time.  Skin: Skin is warm and dry. No rash noted. She is not diaphoretic. No erythema. No pallor.  Psychiatric: She has a normal mood and affect. Her behavior is normal. Judgment and thought content normal.     Lab Results  Component Value Date   WBC 6.3 07/04/2012   HGB 12.3 07/04/2012   HCT 37.5 07/04/2012   PLT 240.0 07/04/2012   GLUCOSE 79 07/04/2012   CHOL 222* 07/04/2012   TRIG 53.0 07/04/2012   HDL 72.10 07/04/2012   LDLDIRECT 137.9 07/04/2012   LDLCALC 95 05/11/2011   ALT 43* 07/04/2012   AST 35 07/04/2012   NA 139 07/04/2012   K 4.1 07/04/2012   CL 103 07/04/2012   CREATININE 0.6 07/04/2012   BUN 19 07/04/2012   CO2 26 07/04/2012   TSH 3.55 07/04/2012   INR 1.1 08/06/2008   HGBA1C 5.5 03/13/2008   MICROALBUR 0.8 12/03/2006  Assessment & Plan:

## 2012-10-23 NOTE — ED Provider Notes (Signed)
History     CSN: [id number]  Arrival date & time 10/23/12  1234   First MD Initiated Contact with Patient 10/23/12 1506      Chief Complaint  Patient presents with  . Shortness of Breath  . Atrial Fibrillation    HPI Pt was seen at 1510,  Per pt, c/o gradual onset and worsening of persistent SOB for the past 2 weeks, worse since yesterday.  Pt describes her symptoms as starting after she took a flight from the [place] to [place] approx 2 weeks ago.  States she noticed that her "ankles swelled," she had a mild cough and was SOB while she was in [place].  Pt's symptoms worsened after she took a plane trip back to the [place] from [place] last night.  Pt states her symptoms worsen on exertion, improve with rest.  Pt was eval by her PMD today, and sent to the ED for further eval and admission. Denies CP/palpitations, no back pain, no abd pain, no N/V/D, no fevers, no rash, no calf/LE pain or unilateral swelling.     Past Medical History  Diagnosis Date  . Hyperlipidemia   . Fasting hyperglycemia     A1C  within normal limits  . Diverticulosis 2002    Dr. Russella Dar  . Personal history of colonic polyps 2002    tubular adenoma  . Tortuous colon   . Vitamin D deficiency   . Pancreatitis due to common bile duct stone   . Other abnormal glucose 2011    102    Past Surgical History  Procedure Date  . Knee surgery     x5 post accident, right  . Total knee arthroplasty 1997    Dr.Wainer,Right  . Cholecystectomy 1997    Pancreatitis secondary to stone  . Total knee arthroplasty 2009    Dr.Wainer, Left  . Tonsillectomy   . Colonoscopy 2013    negative  . Colonoscopy 2002    adenomatous polyp; Dr Russella Dar    Family History  Problem Relation Age of Onset  . Cancer Father     Oral, smoker  . Hypertension Mother   . Stroke Mother 34  . Lung cancer Maternal Uncle     smoker  . Cancer Brother     CNS Cancer  . Colon cancer Brother     late 40's  . Alcohol abuse Brother   . Diabetes Neg Hx       History  Substance Use Topics  . Smoking status: Never Smoker   . Smokeless tobacco: Never Used  . Alcohol Use: No     2-3 /week    Review of Systems ROS: Statement: All systems negative except as marked or noted in the HPI; Constitutional: Negative for fever and chills. ; ; Eyes: Negative for eye pain, redness and discharge. ; ; ENMT: Negative for ear pain, hoarseness, nasal congestion, sinus pressure and sore throat. ; ; Cardiovascular: Negative for chest pain, palpitations, diaphoresis, +dyspnea and peripheral edema. ; ; Respiratory: Negative for cough, wheezing and stridor. ; ; Gastrointestinal: Negative for nausea, vomiting, diarrhea, abdominal pain, blood in stool, hematemesis, jaundice and rectal bleeding. . ; ; Genitourinary: Negative for dysuria, flank pain and hematuria. ; ; Musculoskeletal: Negative for back pain and neck pain. Negative for swelling and trauma.; ; Skin: Negative for pruritus, rash, abrasions, blisters, bruising and skin lesion.; ; Neuro: Negative for headache, lightheadedness and neck stiffness. Negative for weakness, altered level of consciousness , altered mental status, extremity weakness, paresthesias, involuntary movement,  seizure and syncope.       Allergies  Meperidine hcl and Penicillins  Home Medications   Current Outpatient Rx  Name Route Sig Dispense Refill  . ASPIRIN EC 81 MG PO TBEC Oral Take 162 mg by mouth daily.      BP 128/83  Pulse 127  Temp 98 F (36.7 C) (Oral)  Resp 24  SpO2 98%  Physical Exam 1515: Physical examination:  Nursing notes reviewed; Vital signs and O2 SAT reviewed;  Constitutional: Well developed, Well nourished, Well hydrated, In no acute distress; Head:  Normocephalic, atraumatic; Eyes: EOMI, PERRL, No scleral icterus; ENMT: Mouth and pharynx normal, Mucous membranes moist; Neck: Supple, Full range of motion, No lymphadenopathy; Cardiovascular: Irregular irregular rate and rhythm, No gallop; Respiratory: Breath  sounds coarse & equal bilaterally, No wheezes.  Speaking full sentences with ease, Normal respiratory effort/excursion; Chest: Nontender, Movement normal; Abdomen: Soft, Nontender, Nondistended, Normal bowel sounds;; Extremities: Pulses normal, No tenderness, +1 pedal edema bilat without calf asymmetry.; Neuro: AA&Ox3, Major CN grossly intact.  Speech clear. No gross focal motor or sensory deficits in extremities.; Skin: Color normal, Warm, Dry.   ED Course  Procedures     MDM  MDM Reviewed: nursing note and vitals Reviewed previous: ECG Interpretation: ECG, labs, x-ray and CT scan Total time providing critical care: 30-74 minutes. This excludes time spent performing separately reportable procedures and services. Consults: admitting MD   CRITICAL CARE Performed by: Laray Anger Total critical care time: 60 Critical care time was exclusive of separately billable procedures and treating other patients. Critical care was necessary to treat or prevent imminent or life-threatening deterioration. Critical care was time spent personally by me on the following activities: development of treatment plan with patient and/or surrogate as well as nursing, discussions with consultants, evaluation of patient's response to treatment, examination of patient, obtaining history from patient or surrogate, ordering and performing treatments and interventions, ordering and review of laboratory studies, ordering and review of radiographic studies, pulse oximetry and re-evaluation of patient's condition.     Date: 10/23/2012  Rate: 155  Rhythm: atrial fibrillation  QRS Axis: right  Intervals: normal  ST/T Wave abnormalities: normal  Conduction Disutrbances:none  Narrative Interpretation:   Old EKG Reviewed: changes noted; previous EKG dated 11/16/2004 was NSR.  Results for orders placed during the hospital encounter of 10/23/12  CBC WITH DIFFERENTIAL      Component Value Range   WBC 8.2  4.0 -  10.5 K/uL   RBC 4.64  3.87 - 5.11 MIL/uL   Hemoglobin 14.0  12.0 - 15.0 g/dL   HCT 16.1  09.6 - 04.5 %   MCV 91.4  78.0 - 100.0 fL   MCH 30.2  26.0 - 34.0 pg   MCHC 33.0  30.0 - 36.0 g/dL   RDW 40.9  81.1 - 91.4 %   Platelets 238  150 - 400 K/uL   Neutrophils Relative 75  43 - 77 %   Neutro Abs 6.2  1.7 - 7.7 K/uL   Lymphocytes Relative 17  12 - 46 %   Lymphs Abs 1.4  0.7 - 4.0 K/uL   Monocytes Relative 7  3 - 12 %   Monocytes Absolute 0.5  0.1 - 1.0 K/uL   Eosinophils Relative 2  0 - 5 %   Eosinophils Absolute 0.1  0.0 - 0.7 K/uL   Basophils Relative 0  0 - 1 %   Basophils Absolute 0.0  0.0 - 0.1 K/uL  BASIC METABOLIC PANEL  Component Value Range   Sodium 141  135 - 145 mEq/L   Potassium 3.8  3.5 - 5.1 mEq/L   Chloride 106  96 - 112 mEq/L   CO2 22  19 - 32 mEq/L   Glucose, Bld 91  70 - 99 mg/dL   BUN 21  6 - 23 mg/dL   Creatinine, Ser 1.61  0.50 - 1.10 mg/dL   Calcium 9.6  8.4 - 09.6 mg/dL   GFR calc non Af Amer 72 (*) >90 mL/min   GFR calc Af Amer 83 (*) >90 mL/min  PROTIME-INR      Component Value Range   Prothrombin Time 14.4  11.6 - 15.2 seconds   INR 1.14  0.00 - 1.49  POCT I-STAT TROPONIN I      Component Value Range   Troponin i, poc 0.01  0.00 - 0.08 ng/mL   Comment 3           PRO B NATRIURETIC PEPTIDE      Component Value Range   Pro B Natriuretic peptide (BNP) 1269.0 (*) 0 - 125 pg/mL   Dg Chest 2 View 10/23/2012  *RADIOLOGY REPORT*  Clinical Data: Shortness of breath.  CHEST - 2 VIEW  Comparison: July 28, 2008.  Findings: Cardiomediastinal silhouette appears normal.  Mild bilateral pleural effusions are noted with right greater than left. Old right rib fractures are noted.  Fluid is noted in the right minor fissure.  Mild central pulmonary vascular congestion is noted.  IMPRESSION: Mild central pulmonary vascular congestion.  Interval development of mild bilateral pleural effusions.   Original Report Authenticated By: Venita Sheffield., M.D.       1520:  HR 150-160's, afib on monitor.  SBP stable in 120-140's.  Pt denies CP.  Denies SOB while at rest.  Will start IV cardizem bolus and gtt for new onset afib.  Will check for new onset CHF with BNP and CXR.  Given pt's recent long travel hx, combined with her symptoms of pedal edema and SOB, will check CT-A chest to r/o PE.   1645:  Pt states she "feels ok."  SBP 110'120's with HR ranging 100-120's on cardizem gtt.  HR has been as low as 90's.  Monitor continues afib.  Pt watching TV.  Denies CP.  Denies SOB at rest.  Talking easily with family at bedside and ED staff without resp distress.  Will dose IV lasix for CHF.  Delay in obtaining CT-A to r/o PE.  Will start IV heparin for new onset afib, this will be sufficient for empiric tx for possible PE.  Will admit.   1720:  Dx and testing d/w pt and family.  Questions answered.  Verb understanding, agreeable to admit.  Pt's HR increasing to 130's while speaking to her regarding dx/admission.  Will re-bolus cardizem and increase gtt rate.   T/C to [facility] Dr. Benjamine Mola, case discussed, including:  HPI, pertinent PM/SHx, VS/PE, dx testing, ED course and treatment:  Aware delay in obtaining CT-A chest to r/o PE, agreeable to admit, requests to write temporary orders, obtain stepdown bed to team 8.   Ct Angio Chest Pe W/cm &/or Wo Cm 10/23/2012  *RADIOLOGY REPORT*  Clinical Data: Leg edema.  Short of breath with exertion.  Recent Product manager.  CT ANGIOGRAPHY CHEST  Technique:  Multidetector CT imaging of the chest using the standard protocol during bolus administration of intravenous contrast. Multiplanar reconstructed images including MIPs were obtained and reviewed to evaluate the vascular anatomy.  Contrast: OMNIPAQUE IOHEXOL 350 MG/ML SOLN  Comparison: Radiography same day  Findings: Pulmonary arterial opacification is excellent.  The study positive for pulmonary emboli with multiple emboli being present in the pulmonary arterial tree on the  right, particularly in the upper lobe branch vessels.  On the left, there is a small or clot burden, though there are numerous small emboli throughout the left pulmonary arterial tree as well.  There are bilateral pleural effusions layering dependently.  There are areas of atelectasis and both lungs.  No pericardial fluid.  No mediastinal or hilar mass or adenopathy.  Scans in the upper abdomen are unremarkable.  IMPRESSION: The study is positive for bilateral pulmonary emboli, more extensive on the right than the left.  Overall clot burden would be described as moderate.  Bilateral effusions with dependent atelectasis.  Critical Value/emergent results were called by telephone at the time of interpretation on 10/23/2012 at 1900 hours to Dr. Clarene Duke, who verbally acknowledged these results.   Original Report Authenticated By: Thomasenia Sales, M.D.      1900:  T/C from Rads MD regarding above: pt already on heparin gtt after bolus per pharmacy consult.  Dx and testing d/w pt and family.  Questions answered.  Verb understanding.   Laray Anger, DO 10/25/12 2317

## 2012-10-23 NOTE — Telephone Encounter (Signed)
Left message on cell for patient to return call when available.  I called patient on home number and offered for her to be seen at one of our other [place] locations, patient was willing to be seen at another location. 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

## 2012-10-23 NOTE — Progress Notes (Addendum)
ANTICOAGULATION CONSULT NOTE - Initial Consult  Pharmacy Consult for heparin Indication: atrial fibrillation  Allergies  Allergen Reactions  . Meperidine Hcl Rash  . Penicillins Rash    Rash as child    Patient Measurements:   Heparin Dosing Weight: 81.7kg  Vital Signs: Temp: 98 F (36.7 C) (10/30 1247) Temp src: Oral (10/30 1247) BP: 130/87 mmHg (10/30 1700) Pulse Rate: 36  (10/30 1700)  Labs:  Basename 10/23/12 1249  HGB 14.0  HCT 42.4  PLT 238  APTT --  LABPROT 14.4  INR 1.14  HEPARINUNFRC --  CREATININE 0.80  CKTOTAL --  CKMB --  TROPONINI --    The CrCl is unknown because both a height and weight (above a minimum accepted value) are required for this calculation.   Medical History: Past Medical History  Diagnosis Date  . Hyperlipidemia   . Fasting hyperglycemia     A1C  within normal limits  . Diverticulosis 2002    Dr. Russella Dar  . Personal history of colonic polyps 2002    tubular adenoma  . Tortuous colon   . Vitamin D deficiency   . Pancreatitis due to common bile duct stone   . Other abnormal glucose 2011    102    Medications:  asa  Assessment: 71 yof presented from her PCP office with new onset afib and SOB with exertion x 10 days. She is to start IV heparin for afib. Her baseline H/H + plts are WNL. She was not on any anticoagulants PTA except for aspirin.   Goal of Therapy:  Heparin level 0.3-0.7 units/ml Monitor platelets by anticoagulation protocol: Yes   Plan:  1. Heparin bolus 4000 units IV x 1 2. Heparin gtt 1200 units/hr 3. Check an 8 hour heparin level 4. Daily heparin level and CBC  Mats Jeanlouis, Drake Leach 10/23/2012,5:44 PM  Addendum: Pt has already been started on IV heparin. Now found a bilateral PE per CT scan. Continue current dosing.  Lysle Pearl, PharmD, BCPS Pager # [phone] 10/23/2012 7:30 PM

## 2012-10-23 NOTE — Assessment & Plan Note (Signed)
Her EKG shows new onset A fib with rvr (rate about 165), she has s/s of acute CHF, I have asked her to go the [facility] for emergent evaluation and treatment

## 2012-10-23 NOTE — H&P (Signed)
[facility] History and Physical  [name] ZOX:[id number] DOB: [birth year] DOA: 10/23/2012  Referring physician: er PCP: Sanda Linger, MD  Specialists:   Chief Complaint: SOB with exertion  HPI: [name] is a 71 y.o. female  Who recently returned from [place] (there for 10 days).  There she developed lower leg edema which was new for her and SOB with exertion.  No SOB while sitting.  She has not chest pain.  No fever, no chills.  Is healthy and takes no medications at home.  She was seen at her family dr office where she was found to be in a fib with RVR.  She was sent to the ER from there for further management.  In the ER a cardizem gtt was started as well as a heparin gtt.   +weight gain +palpitations +leg swelling +fatigue   Review of Systems: all systems reviewed, negative unless stated above  Past Medical History  Diagnosis Date  . Hyperlipidemia   . Fasting hyperglycemia     A1C  within normal limits  . Diverticulosis 2002    Dr. Russella Dar  . Personal history of colonic polyps 2002    tubular adenoma  . Tortuous colon   . Vitamin D deficiency   . Pancreatitis due to common bile duct stone   . Other abnormal glucose 2011    102   Past Surgical History  Procedure Date  . Knee surgery     x5 post accident, right  . Total knee arthroplasty 1997    Dr.Wainer,Right  . Cholecystectomy 1997    Pancreatitis secondary to stone  . Total knee arthroplasty 2009    Dr.Wainer, Left  . Tonsillectomy   . Colonoscopy 2013    negative  . Colonoscopy 2002    adenomatous polyp; Dr Russella Dar   Social History:  reports that she has never smoked. She has never used smokeless tobacco. She reports that she does not drink alcohol or use illicit drugs. Lives at home with husband- occasional use of alcohol  Allergies  Allergen Reactions  . Meperidine Hcl Rash  . Penicillins Rash    Rash as child    Family History  Problem Relation Age of Onset  . Cancer Father    Oral, smoker  . Hypertension Mother   . Stroke Mother 35  . Lung cancer Maternal Uncle     smoker  . Cancer Brother     CNS Cancer  . Colon cancer Brother     late 37's  . Alcohol abuse Brother   . Diabetes Neg Hx     Prior to Admission medications   Medication Sig Start Date End Date Taking? Authorizing Provider  aspirin EC 81 MG tablet Take 162 mg by mouth daily.   Yes Historical Provider, MD   Physical Exam: Filed Vitals:   10/23/12 1530 10/23/12 1600 10/23/12 1639 10/23/12 1700  BP: 113/74 98/78 128/83 130/87  Pulse:  93 127 36  Temp:      TempSrc:      Resp: 18 20 24 22   SpO2: 100% 100% 98% 100%     General:  A+Ox3, NAD  Eyes: wnl  ENT: wnl  Neck: no JVD  Cardiovascular: irregular, no murmur, + edema in ankles R>L  Respiratory:  Clear, no wheezing  Abdomen: +BS, soft, NT/ND  Skin: no rashes or lesions  Musculoskeletal: moves all 4 extremitites  Psychiatric: no SI/no HI  Neurologic: CN 2-132 intact  Labs on Admission:  Basic Metabolic Panel:  Lab 10/23/12 1249  NA 141  K 3.8  CL 106  CO2 22  GLUCOSE 91  BUN 21  CREATININE 0.80  CALCIUM 9.6  MG --  PHOS --   Liver Function Tests: No results found for this basename: AST:5,ALT:5,ALKPHOS:5,BILITOT:5,PROT:5,ALBUMIN:5 in the last 168 hours No results found for this basename: LIPASE:5,AMYLASE:5 in the last 168 hours No results found for this basename: AMMONIA:5 in the last 168 hours CBC:  Lab 10/23/12 1249  WBC 8.2  NEUTROABS 6.2  HGB 14.0  HCT 42.4  MCV 91.4  PLT 238   Cardiac Enzymes: No results found for this basename: CKTOTAL:5,CKMB:5,CKMBINDEX:5,TROPONINI:5 in the last 168 hours  BNP (last 3 results)  Basename 10/23/12 1249  PROBNP 1269.0*   CBG: No results found for this basename: GLUCAP:5 in the last 168 hours  Radiological Exams on Admission: Dg Chest 2 View  10/23/2012  *RADIOLOGY REPORT*  Clinical Data: Shortness of breath.  CHEST - 2 VIEW  Comparison: July 28, 2008.  Findings: Cardiomediastinal silhouette appears normal.  Mild bilateral pleural effusions are noted with right greater than left. Old right rib fractures are noted.  Fluid is noted in the right minor fissure.  Mild central pulmonary vascular congestion is noted.  IMPRESSION: Mild central pulmonary vascular congestion.  Interval development of mild bilateral pleural effusions.   Original Report Authenticated By: Venita Sheffield., M.D.     EKG: Independently reviewed.  A fib with RVR  Assessment/Plan Active Problems:  Dyspnea on exertion  Atrial fibrillation, rapid  Edema extremities   1. A fib with RVR-  cardizem gtt, cardiology consult in AM, heparin gtt per pharmacy, place in SDU while on cardizem gtt, echo pending- depending on echo results- CHAD2 0   2. SOB on exertion- CTA to r/o PE (patient has been traveling), on heparin gtt, well's criteria of 6   3. Edema/pulm/inc BNP- IV lasix cautiously as will have CTA and will need to monitor renal function, daily weights, strict I/Os    Code Status: full Family Communication: patient at bedside Disposition Plan: home when better  Time spent: 70 min  VANN, JESSICA [facility] Pager [phone]  If 7PM-7AM, please contact night-coverage [email or web address] Password [email or web address] 10/23/2012, 5:51 PM

## 2012-10-23 NOTE — ED Notes (Signed)
Clarene Duke, MD notified re: BP: 91/23, Cardizem rate reduced to 5mg /hr, pt given 250 mL bolus of NS

## 2012-10-23 NOTE — Telephone Encounter (Signed)
Caller: [name]/Patient; Patient Name: [name]; PCP: Marga Melnick; Best Callback Phone Number: [phone]; Reason for call: shortness of breath.  Onset 10 days ago.  Patient has been travelling out of the country in [place] and [place] for the past month.  States SOB started while in [place], and she returned 2300 10/22/12 [place].  Notes her ankles are very swollen bilaterally as well.  Moderate cough.  Afebrile.  No symptoms of severe distress.  Per breathing problems protocol, See Immediately disposition; no appts available in office within that time frame.  Per office preference, info to office per Epic/high priority for staff management.

## 2012-10-23 NOTE — Telephone Encounter (Signed)
Tom , she  is an extremely stoic individual; the clinical history is very worrisome for deep venous thrombosis and pulmonary thromboemboli.Thanks for seeing her. [employer]

## 2012-10-23 NOTE — ED Notes (Addendum)
Pt sent here by PCP for new onset afib and SOB with exertion x 10 days; pt returned last night from a 10 day trip to [place]

## 2012-10-24 DIAGNOSIS — I059 Rheumatic mitral valve disease, unspecified: Secondary | ICD-10-CM

## 2012-10-24 DIAGNOSIS — I2699 Other pulmonary embolism without acute cor pulmonale: Principal | ICD-10-CM

## 2012-10-24 DIAGNOSIS — I4891 Unspecified atrial fibrillation: Secondary | ICD-10-CM

## 2012-10-24 DIAGNOSIS — R609 Edema, unspecified: Secondary | ICD-10-CM

## 2012-10-24 DIAGNOSIS — E876 Hypokalemia: Secondary | ICD-10-CM | POA: Diagnosis not present

## 2012-10-24 DIAGNOSIS — I509 Heart failure, unspecified: Secondary | ICD-10-CM

## 2012-10-24 DIAGNOSIS — I5023 Acute on chronic systolic (congestive) heart failure: Secondary | ICD-10-CM

## 2012-10-24 DIAGNOSIS — I50811 Acute right heart failure: Secondary | ICD-10-CM | POA: Diagnosis present

## 2012-10-24 LAB — HEPARIN LEVEL (UNFRACTIONATED)
Heparin Unfractionated: 0.56 IU/mL (ref 0.30–0.70)
Heparin Unfractionated: 0.49 IU/mL (ref 0.30–0.70)

## 2012-10-24 LAB — CBC
Hemoglobin: 12.6 g/dL (ref 12.0–15.0)
MCHC: 32.1 g/dL (ref 30.0–36.0)
Platelets: 259 10*3/uL (ref 150–400)
RDW: 13.8 % (ref 11.5–15.5)

## 2012-10-24 LAB — BASIC METABOLIC PANEL
BUN: 19 mg/dL (ref 6–23)
Calcium: 8.8 mg/dL (ref 8.4–10.5)
GFR calc Af Amer: 79 mL/min — ABNORMAL LOW (ref >90)
GFR calc non Af Amer: 68 mL/min — ABNORMAL LOW (ref >90)
Potassium: 3.4 mEq/L — ABNORMAL LOW (ref 3.5–5.1)

## 2012-10-24 LAB — PROTIME-INR
INR: 1.38 (ref 0.00–1.49)
Prothrombin Time: 16.6 seconds — ABNORMAL HIGH (ref 11.6–15.2)

## 2012-10-24 LAB — PRO B NATRIURETIC PEPTIDE: Pro B Natriuretic peptide (BNP): 857.2 pg/mL — ABNORMAL HIGH (ref 0–125)

## 2012-10-24 MED ORDER — POTASSIUM CHLORIDE CRYS ER 20 MEQ PO TBCR: 40.0000 meq | EXTENDED_RELEASE_TABLET | Freq: Every day | ORAL | Status: DC

## 2012-10-24 MED ORDER — WARFARIN - PHARMACIST DOSING INPATIENT
Freq: Every day | Status: DC
  Administered 2012-10-24: 18:00:00

## 2012-10-24 MED ORDER — WARFARIN SODIUM 7.5 MG PO TABS
7.5000 mg | ORAL_TABLET | Freq: Once | ORAL | Status: AC
  Administered 2012-10-24: 7.5 mg via ORAL
  Filled 2012-10-24: qty 1

## 2012-10-24 MED ORDER — POTASSIUM CHLORIDE CRYS ER 20 MEQ PO TBCR
20.0000 meq | EXTENDED_RELEASE_TABLET | Freq: Two times a day (BID) | ORAL | Status: DC
  Administered 2012-10-24 – 2012-10-27 (×6): 20 meq via ORAL
  Filled 2012-10-24 (×7): qty 1

## 2012-10-24 MED ORDER — WARFARIN VIDEO
Freq: Once | Status: AC
  Administered 2012-10-24: 16:00:00

## 2012-10-24 MED ORDER — FUROSEMIDE 10 MG/ML IJ SOLN
INTRAMUSCULAR | Status: AC
  Filled 2012-10-24: qty 4

## 2012-10-24 MED ORDER — FUROSEMIDE 10 MG/ML IJ SOLN
20.0000 mg | Freq: Two times a day (BID) | INTRAMUSCULAR | Status: DC
  Administered 2012-10-24 – 2012-10-25 (×2): 20 mg via INTRAVENOUS
  Filled 2012-10-24 (×2): qty 2

## 2012-10-24 MED ORDER — COUMADIN BOOK
Freq: Once | Status: AC
  Administered 2012-10-24: 16:00:00
  Filled 2012-10-24: qty 1

## 2012-10-24 NOTE — Consult Note (Signed)
CARDIOLOGY CONSULT NOTE   Patient ID: [name] MRN: [id number] DOB/AGE: 1941-01-21 71 y.o.  Admit date: 10/23/2012  Primary Physician   Sanda Linger, MD Primary Cardiologist   New Reason for Consultation   Atrial fib/CHF/PE  Monica:WJXB E Beck is a 71 y.o. female with no history of CAD. On a trip to [place], she developed LE edema and DOE. She finished the trip, and came home. No chest pain. Back home, she had not improved saw her family MD. She was in atrial fib/RVR and was sent to the hospital. Her rate has improved slightly with Cardizem but she has volume overload by X ray and exam. CT chest showed moderate of bilateral PEs. Currently, she is on heparin and Cardizem at 10 mg/hr. She feels better but is aware of her elevated heart rate and gets SOB getting out of bed. The lower extremity edema and the atrial fibrillation are both new and her edema has improved with Lasix 40 mg IV yesterday and 20 mg IV today.  BP has been stable with systolics 110-120. O2 sats 95% RA  Echo images reviewed personally EF 45% in setting of rapid AF. RV mildly HK. No significant TR to measure PAP.   Past Medical History  Diagnosis Date  . Hyperlipidemia   . Fasting hyperglycemia     A1C  within normal limits  . Diverticulosis 2002    Dr. Russella Dar  . Personal history of colonic polyps 2002    tubular adenoma  . Tortuous colon   . Vitamin D deficiency   . Pancreatitis due to common bile duct stone   . Other abnormal glucose 2011    102     Past Surgical History  Procedure Date  . Knee surgery     x5 post accident, right  . Total knee arthroplasty 1997    Dr.Wainer,Right  . Cholecystectomy 1997    Pancreatitis secondary to stone  . Total knee arthroplasty 2009    Dr.Wainer, Left  . Tonsillectomy   . Colonoscopy 2013    negative  . Colonoscopy 2002    adenomatous polyp; Dr Russella Dar    Allergies  Allergen Reactions  . Meperidine Hcl Rash  . Penicillins Rash    Rash as child     I have reviewed the patient's current medications    . sodium chloride   Intravenous STAT  . aspirin EC  162 mg Oral Daily  . diltiazem (CARDIZEM) infusion  5-15 mg/hr Intravenous Once  . diltiazem  15 mg Intravenous Once  . furosemide      . furosemide  20 mg Intravenous QAC breakfast  . furosemide  40 mg Intravenous Once  . heparin  4,000 Units Intravenous Once  . sodium chloride  250 mL Intravenous Once  . sodium chloride  3 mL Intravenous Q12H  . DISCONTD: aspirin EC  162 mg Oral Daily  . DISCONTD: sodium chloride  3 mL Intravenous Q12H      . diltiazem (CARDIZEM) infusion 10 mg/hr (10/24/12 1400)  . heparin 1,200 Units/hr (10/24/12 1400)   sodium chloride, acetaminophen, acetaminophen, iohexol, ondansetron (ZOFRAN) IV, ondansetron (ZOFRAN) IV, ondansetron, sodium chloride, zolpidem  Prior to Admission medications   Medication Sig Taking? Authorizing Provider  aspirin EC 81 MG tablet Take 162 mg by mouth daily. Yes Historical Provider, MD     History   Social History  . Marital Status: Married    Spouse Name: N/A    Number of Children: 4  . Years of  Education: N/A   Occupational History  . retired     Charity fundraiser   Social History Main Topics  . Smoking status: Never Smoker   . Smokeless tobacco: Never Used  . Alcohol Use: No     2-3 /week  . Drug Use: No  . Sexually Active: No   Other Topics Concern  . Not on file   Social History Narrative  . Lives with husband.     Family History  Problem Relation Age of Onset  . Cancer Father     Oral, smoker  . Hypertension Mother   . Stroke Mother 88  . Lung cancer Maternal Uncle     smoker  . Cancer Brother     CNS Cancer  . Colon cancer Brother     late 55's  . Alcohol abuse Brother   . Diabetes Neg Hx      ROS:  Full 14 point review of systems complete and found to be negative unless listed above.  Physical Exam: Blood pressure 116/75, pulse 127, temperature 97.7 F (36.5 C), temperature source Oral,  resp. rate 18, height 5\' 6"  (1.676 m), weight 230 lb 8 oz (104.554 kg), SpO2 95.00%.  General: Well developed, well nourished, female in no acute distress Head: Eyes PERRLA, No xanthomas.   Normocephalic and atraumatic, oropharynx without edema or exudate. Dentition: good Lungs: bilateral rales Heart:Heart: thchy irregular rate and rhythm with S1, S2, no significant Murmur. + RV lift, pulses are 2+ all 4 extrem.   Neck: No carotid bruits. No lymphadenopathy.  JVD at 10 cm. Abdomen: Bowel sounds present, abdomen soft and non-tender without masses or hernias noted. Msk:  No spine or cva tenderness. No weakness, no joint deformities or effusions. Extremities: No clubbing or cyanosis. 1-2+ edema.  Neuro: Alert and oriented X 3. No focal deficits noted. Psych:  Good affect, responds appropriately Skin: No rashes or lesions noted.  Labs:   Lab Results  Component Value Date   WBC 8.5 10/24/2012   HGB 12.6 10/24/2012   HCT 39.2 10/24/2012   MCV 90.7 10/24/2012   PLT 259 10/24/2012    Basename 10/23/12 1249  INR 1.14     Lab 10/24/12 0136  NA 141  K 3.4*  CL 105  CO2 24  BUN 19  CREATININE 0.84  CALCIUM 8.8  PROT --  BILITOT --  ALKPHOS --  ALT --  AST --  GLUCOSE 99    Basename 10/23/12 1414  TROPIPOC 0.01   Pro B Natriuretic peptide (BNP)  Date/Time Value Range Status  10/24/2012  1:36 AM 857.2* 0 - 125 pg/mL Final  10/23/2012 12:49 PM 1269.0* 0 - 125 pg/mL Final   Echo: pending  ECG:  24-Oct-2012 06:39:15  [facility] ROUTINE RECORD Atrial fibrillation with rapid ventricular response Low voltage QRS Cannot rule out Anteroseptal infarct , age undetermined Abnormal ECG 10mm/s 57mm/mV 100Hz  8.0.1 12SL 241 HD CID: 1 Referred by: Unconfirmed Vent. rate 139 BPM PR interval * ms QRS duration 80 ms QT/QTc 252/383 ms P-R-T axes * 82 -8  Radiology:  Dg Chest 2 View 10/23/2012  *RADIOLOGY REPORT*  Clinical Data: Shortness of breath.  CHEST - 2 VIEW   Comparison: July 28, 2008.  Findings: Cardiomediastinal silhouette appears normal.  Mild bilateral pleural effusions are noted with right greater than left. Old right rib fractures are noted.  Fluid is noted in the right minor fissure.  Mild central pulmonary vascular congestion is noted.  IMPRESSION: Mild central pulmonary vascular  congestion.  Interval development of mild bilateral pleural effusions.   Original Report Authenticated By: Venita Sheffield., M.D.    Ct Angio Chest Pe W/cm &/or Wo Cm 10/23/2012  *RADIOLOGY REPORT*  Clinical Data: Leg edema.  Short of breath with exertion.  Recent Product manager.  CT ANGIOGRAPHY CHEST  Technique:  Multidetector CT imaging of the chest using the standard protocol during bolus administration of intravenous contrast. Multiplanar reconstructed images including MIPs were obtained and reviewed to evaluate the vascular anatomy.  Contrast: OMNIPAQUE IOHEXOL 350 MG/ML SOLN  Comparison: Radiography same day  Findings: Pulmonary arterial opacification is excellent.  The study positive for pulmonary emboli with multiple emboli being present in the pulmonary arterial tree on the right, particularly in the upper lobe branch vessels.  On the left, there is a small or clot burden, though there are numerous small emboli throughout the left pulmonary arterial tree as well.  There are bilateral pleural effusions layering dependently.  There are areas of atelectasis and both lungs.  No pericardial fluid.  No mediastinal or hilar mass or adenopathy.  Scans in the upper abdomen are unremarkable.  IMPRESSION: The study is positive for bilateral pulmonary emboli, more extensive on the right than the left.  Overall clot burden would be described as moderate.  Bilateral effusions with dependent atelectasis.  Critical Value/emergent results were called by telephone at the time of interpretation on 10/23/2012 at 1900 hours to Dr. Clarene Duke, who verbally acknowledged these results.    Original Report Authenticated By: Thomasenia Sales, M.D.     ASSESSMENT AND PLAN:   The patient was seen today by Dr Gala Romney, the patient evaluated and the data reviewed.  Principal Problem:  *Bilateral pulmonary embolism - continue heparin for now. Will need oral anticoag, would be appropriate for a novel agent, MD review data and advise.  Active Problems:  Dyspnea on exertion - Has volume overload by exam, has been partly diuresed, but needs more. Agree with watching renal function carefully as she got dye yesterday, but will increase Lasix to 20 mg IV BID, consider increasing further to 40 mg IV BID. Will supp K+ and make sure she has a recheck ordered for AM. Likely acute diastolic CHF, will f/u on echo for final determination.   Atrial fibrillation, rapid - Continue to titrate Cardizem, increase Cardizem upper limit to 20 mg/hr. Consider IV Lopressor 2.5 mg Q 6 hr to help with HR control but HR may be elevated in part due to PE/CHF, so treatment of underlying issues will help the most.    Edema extremities - See DOE above.  Otherwise, per primary MD, we will continue to follow.   Signed: Theodore Demark 10/24/2012, 2:27 PM Co-Sign MD  Patient seen and examined with Theodore Demark, PA-C. We discussed all aspects of the encounter. I agree with the assessment and plan as stated above.   A)   1. Bilateral PEs 2. AF with RVR 3. R-sided heart failure, acute   Plan/Discussion: Echo and CT scan reviewed. She has rapid AF and R-sided HF in setting of acute PEs. Agree with anticoagulation (consider transition to Xarelto) and rate control of AF with cardizem. Would check LE u/s and if burden if residual LE clot is high would consider IVC filter. Can add digoxin +/- b-blocker for AF as needed. Suspect AF will quiet down with treatment of PEs. We will follow.  Monica Stierwalt,MD 4:02 PM

## 2012-10-24 NOTE — Progress Notes (Signed)
ANTICOAGULATION CONSULT NOTE - Initial Consult  Pharmacy Consult for Coumadin Indication: pulmonary embolus  Allergies  Allergen Reactions  . Meperidine Hcl Rash  . Penicillins Rash    Rash as child    Patient Measurements: Height: 5\' 6"  (167.6 cm) Weight: 230 lb 8 oz (104.554 kg) IBW/kg (Calculated) : 59.3  Heparin Dosing Weight:   Vital Signs: Temp: 97.7 F (36.5 C) (10/31 1232) Temp src: Oral (10/31 1232) BP: 116/75 mmHg (10/31 1232) Pulse Rate: 127  (10/31 1232)  Labs:  Basename 10/24/12 0851 10/24/12 0136 10/23/12 1249  HGB -- 12.6 14.0  HCT -- 39.2 42.4  PLT -- 259 238  APTT -- -- --  LABPROT -- -- 14.4  INR -- -- 1.14  HEPARINUNFRC 0.49 0.56 --  CREATININE -- 0.84 0.80  CKTOTAL -- -- --  CKMB -- -- --  TROPONINI -- -- --    Estimated Creatinine Clearance: 74 ml/min (by C-G formula based on Cr of 0.84).   Medical History: Past Medical History  Diagnosis Date  . Hyperlipidemia   . Fasting hyperglycemia     A1C  within normal limits  . Diverticulosis 2002    Dr. Russella Dar  . Personal history of colonic polyps 2002    tubular adenoma  . Tortuous colon   . Vitamin D deficiency   . Pancreatitis due to common bile duct stone   . Other abnormal glucose 2011    102    Medications:  Scheduled:    . sodium chloride   Intravenous STAT  . aspirin EC  162 mg Oral Daily  . diltiazem  15 mg Intravenous Once  . furosemide      . furosemide  20 mg Intravenous BID  . furosemide  40 mg Intravenous Once  . heparin  4,000 Units Intravenous Once  . potassium chloride  20 mEq Oral BID  . sodium chloride  250 mL Intravenous Once  . sodium chloride  3 mL Intravenous Q12H  . DISCONTD: aspirin EC  162 mg Oral Daily  . DISCONTD: furosemide  20 mg Intravenous QAC breakfast  . DISCONTD: potassium chloride  40 mEq Oral Daily  . DISCONTD: sodium chloride  3 mL Intravenous Q12H    Assessment: 71yo female on Heparin, now adding Coumadin for bilateral PE.  Baseline INR  wnl.  Goal of Therapy:  INR 2-3 Monitor platelets by anticoagulation protocol: Yes   Plan:  1.  Coumadin 7.5mg  2.  Daily INR 3.  Coumadin book and video  Marisue Humble, PharmD Clinical Pharmacist [facility]

## 2012-10-24 NOTE — Progress Notes (Addendum)
[facility] PROGRESS NOTE  [name] BJY:[id number] DOB: January 06, 1941 DOA: 10/23/2012 PCP: Monica Linger, MD  Assessment/Plan: Principal Problem:  *Bilateral pulmonary embolism/ Dyspnea on exertion Cont Heparin- f/u on lower extremity ultrasound- will start Coumadin today  Active Problems:   Atrial fibrillation, rapid - cardiazem infusion at 10mg /her but SBP in low 100's and HR still at 120-130 range at rest-Cardiology consulted    Edema extremities Improved with diuretics  Acute on Chronic systolic CHF ECHO: The patient was in atrial fibrillation with rapid response during this study. Normal LV size with global hypokinesis, EF 40-45%. Normal RV size with mildly decreased systolic function. Moderate biatrial enlargement . Would consider repeating echo when HR is controlled to reassess EF.  - cardiology has evaluated upon my request- BID Lasix ordered.    Hypokalemia Will order BID KCL as well.  - recheck in AM   Code Status: full code Family Communication: discussed with husband Disposition Plan: follow in SDU DVT prophylaxis: heparin infusion   Brief narrative: 71 y/o female who returned from a trip to [place] on Tuesday. She noticed about a week ago when in [place] that her ankles were swollen (equally) and she was short of breath. She begain to take ASA regularly but waited until returning to the [place] before coming to the hospital- here she was found to have b/l PEs, A-fib and pulmonary edema.   Consultants:  cardiology  Procedures:  none  Antibiotics:  none  HPI/Subjective: No complaints of dyspnea, chest pain or palpitations currently.   Objective: Filed Vitals:   10/24/12 0015 10/24/12 0451 10/24/12 0759 10/24/12 1232  BP: 111/68 105/66 100/67 116/75  Pulse: 72 72 124 127  Temp: 97.8 F (36.6 C) 98.3 F (36.8 C) 97.8 F (36.6 C) 97.7 F (36.5 C)  TempSrc: Oral Oral Oral Oral  Resp: 21 20 18 18   Height:      Weight: 104.554 kg (230 lb 8  oz)     SpO2: 94% 93% 100% 95%    Intake/Output Summary (Last 24 hours) at 10/24/12 1545 Last data filed at 10/24/12 1400  Gross per 24 hour  Intake 1088.67 ml  Output   3350 ml  Net -2261.33 ml    Exam:   General:  Alert, no acute distress  Cardiovascular: IIRR, no murmurs  Respiratory: CTA b/l   Abdomen: Soft, NT, ND, BS+  Ext: no c/c/e  Data Reviewed: Basic Metabolic Panel:  Lab 10/24/12 0865 10/23/12 1249  NA 141 141  K 3.4* 3.8  CL 105 106  CO2 24 22  GLUCOSE 99 91  BUN 19 21  CREATININE 0.84 0.80  CALCIUM 8.8 9.6  MG -- --  PHOS -- --   Liver Function Tests: No results found for this basename: AST:5,ALT:5,ALKPHOS:5,BILITOT:5,PROT:5,ALBUMIN:5 in the last 168 hours No results found for this basename: LIPASE:5,AMYLASE:5 in the last 168 hours No results found for this basename: AMMONIA:5 in the last 168 hours CBC:  Lab 10/24/12 0136 10/23/12 1249  WBC 8.5 8.2  NEUTROABS -- 6.2  HGB 12.6 14.0  HCT 39.2 42.4  MCV 90.7 91.4  PLT 259 238   Cardiac Enzymes: No results found for this basename: CKTOTAL:5,CKMB:5,CKMBINDEX:5,TROPONINI:5 in the last 168 hours BNP (last 3 results)  Basename 10/24/12 0136 10/23/12 1249  PROBNP 857.2* 1269.0*   CBG: No results found for this basename: GLUCAP:5 in the last 168 hours  Recent Results (from the past 240 hour(s))  MRSA PCR SCREENING     Status: Normal   Collection  Time   10/23/12  8:04 PM      Component Value Range Status Comment   MRSA by PCR NEGATIVE  NEGATIVE Final      Studies: Dg Chest 2 View  10/23/2012  *RADIOLOGY REPORT*  Clinical Data: Shortness of breath.  CHEST - 2 VIEW  Comparison: July 28, 2008.  Findings: Cardiomediastinal silhouette appears normal.  Mild bilateral pleural effusions are noted with right greater than left. Old right rib fractures are noted.  Fluid is noted in the right minor fissure.  Mild central pulmonary vascular congestion is noted.  IMPRESSION: Mild central pulmonary  vascular congestion.  Interval development of mild bilateral pleural effusions.   Original Report Authenticated By: Venita Sheffield., M.D.    Ct Angio Chest Pe W/cm &/or Wo Cm  10/23/2012  *RADIOLOGY REPORT*  Clinical Data: Leg edema.  Short of breath with exertion.  Recent Product manager.  CT ANGIOGRAPHY CHEST  Technique:  Multidetector CT imaging of the chest using the standard protocol during bolus administration of intravenous contrast. Multiplanar reconstructed images including MIPs were obtained and reviewed to evaluate the vascular anatomy.  Contrast: OMNIPAQUE IOHEXOL 350 MG/ML SOLN  Comparison: Radiography same day  Findings: Pulmonary arterial opacification is excellent.  The study positive for pulmonary emboli with multiple emboli being present in the pulmonary arterial tree on the right, particularly in the upper lobe branch vessels.  On the left, there is a small or clot burden, though there are numerous small emboli throughout the left pulmonary arterial tree as well.  There are bilateral pleural effusions layering dependently.  There are areas of atelectasis and both lungs.  No pericardial fluid.  No mediastinal or hilar mass or adenopathy.  Scans in the upper abdomen are unremarkable.  IMPRESSION: The study is positive for bilateral pulmonary emboli, more extensive on the right than the left.  Overall clot burden would be described as moderate.  Bilateral effusions with dependent atelectasis.  Critical Value/emergent results were called by telephone at the time of interpretation on 10/23/2012 at 1900 hours to Dr. Clarene Duke, who verbally acknowledged these results.   Original Report Authenticated By: Thomasenia Sales, M.D.     Scheduled Meds:   . sodium chloride   Intravenous STAT  . aspirin EC  162 mg Oral Daily  . diltiazem (CARDIZEM) infusion  5-15 mg/hr Intravenous Once  . diltiazem  15 mg Intravenous Once  . furosemide      . furosemide  20 mg Intravenous BID  . furosemide  40  mg Intravenous Once  . heparin  4,000 Units Intravenous Once  . potassium chloride  40 mEq Oral Daily  . sodium chloride  250 mL Intravenous Once  . sodium chloride  3 mL Intravenous Q12H  . DISCONTD: aspirin EC  162 mg Oral Daily  . DISCONTD: furosemide  20 mg Intravenous QAC breakfast  . DISCONTD: sodium chloride  3 mL Intravenous Q12H   Continuous Infusions:   . diltiazem (CARDIZEM) infusion 10 mg/hr (10/24/12 1400)  . heparin 1,200 Units/hr (10/24/12 1400)    ________________________________________________________________________  Time spent: 35 min    [facility]  [facility] Pager [phone] If 8PM-8AM, please contact night-coverage at [email or web address], password [facility] 10/24/2012, 3:45 PM  LOS: 1 day

## 2012-10-24 NOTE — Progress Notes (Addendum)
*  PRELIMINARY RESULTS* Vascular Ultrasound Bilateral lower extremity venous duplex has been completed. Evidence of Mobile DVT in the right popliteal vein. No other evidence of DVT in bilateral lower extremities. Bilateral upper extremity venous duplex has been completed.  No evidence of deep or superficial thrombus in the upper extremities.   Gave verbal results to [place], Charity fundraiser.  Farrel Demark, RDMS, RVT 10/24/2012, 6:09 PM

## 2012-10-24 NOTE — Progress Notes (Signed)
  Echocardiogram 2D Echocardiogram has been performed.  [name] 10/24/2012, 12:25 PM

## 2012-10-24 NOTE — Progress Notes (Signed)
ANTICOAGULATION CONSULT NOTE - Follow Up Consult  Pharmacy Consult for heparin Indication: atrial fibrillation and PE  Allergies  Allergen Reactions  . Meperidine Hcl Rash  . Penicillins Rash    Rash as child    Patient Measurements: Height: 5\' 6"  (167.6 cm) Weight: 230 lb 8 oz (104.554 kg) IBW/kg (Calculated) : 59.3  Heparin Dosing Weight: 81.7 kg  Vital Signs: Temp: 97.8 F (36.6 C) (10/31 0759) Temp src: Oral (10/31 0759) BP: 100/67 mmHg (10/31 0759) Pulse Rate: 124  (10/31 0759)  Labs:  Basename 10/24/12 0851 10/24/12 0136 10/23/12 1249  HGB -- 12.6 14.0  HCT -- 39.2 42.4  PLT -- 259 238  APTT -- -- --  LABPROT -- -- 14.4  INR -- -- 1.14  HEPARINUNFRC 0.49 0.56 --  CREATININE -- 0.84 0.80  CKTOTAL -- -- --  CKMB -- -- --  TROPONINI -- -- --    Estimated Creatinine Clearance: 74 ml/min (by C-G formula based on Cr of 0.84).   Medications:  Scheduled:     . sodium chloride   Intravenous STAT  . aspirin EC  162 mg Oral Daily  . diltiazem (CARDIZEM) infusion  5-15 mg/hr Intravenous Once  . diltiazem  15 mg Intravenous Once  . furosemide      . furosemide  20 mg Intravenous QAC breakfast  . furosemide  40 mg Intravenous Once  . heparin  4,000 Units Intravenous Once  . sodium chloride  250 mL Intravenous Once  . sodium chloride  3 mL Intravenous Q12H  . DISCONTD: aspirin EC  162 mg Oral Daily  . DISCONTD: sodium chloride  3 mL Intravenous Q12H   Infusions:     . diltiazem (CARDIZEM) infusion 10 mg/hr (10/24/12 0530)  . heparin 1,200 Units/hr (10/23/12 1816)    Assessment: 71 yo female with afib and PE is currently on therapeutic heparin.  Repeat heparin level is therapeutic.   Goal of Therapy:  Heparin level 0.3-0.7 units/ml Monitor platelets by anticoagulation protocol: Yes   Plan:  1) Continue heparin at 1200 units/hr 2) Daily heparin level and CBC  Pham, Minh Quang 10/24/2012,10:52 AM

## 2012-10-24 NOTE — Progress Notes (Signed)
ANTICOAGULATION CONSULT NOTE - Follow Up Consult  Pharmacy Consult for heparin Indication: atrial fibrillation and PE  Allergies  Allergen Reactions  . Meperidine Hcl Rash  . Penicillins Rash    Rash as child    Patient Measurements: Height: 5\' 6"  (167.6 cm) Weight: 230 lb 8 oz (104.554 kg) IBW/kg (Calculated) : 59.3  Heparin Dosing Weight: 81.7 kg  Vital Signs: Temp: 97.8 F (36.6 C) (10/31 0015) Temp src: Oral (10/31 0015) BP: 111/68 mmHg (10/31 0015) Pulse Rate: 72  (10/31 0015)  Labs:  Basename 10/24/12 0136 10/23/12 1249  HGB 12.6 14.0  HCT 39.2 42.4  PLT 259 238  APTT -- --  LABPROT -- 14.4  INR -- 1.14  HEPARINUNFRC 0.56 --  CREATININE -- 0.80  CKTOTAL -- --  CKMB -- --  TROPONINI -- --    Estimated Creatinine Clearance: 77.7 ml/min (by C-G formula based on Cr of 0.8).   Medications:  Scheduled:    . sodium chloride   Intravenous STAT  . aspirin EC  162 mg Oral Daily  . diltiazem (CARDIZEM) infusion  5-15 mg/hr Intravenous Once  . diltiazem  15 mg Intravenous Once  . furosemide  20 mg Intravenous QAC breakfast  . furosemide  40 mg Intravenous Once  . heparin  4,000 Units Intravenous Once  . sodium chloride  250 mL Intravenous Once  . sodium chloride  3 mL Intravenous Q12H  . DISCONTD: aspirin EC  162 mg Oral Daily  . DISCONTD: sodium chloride  3 mL Intravenous Q12H   Infusions:    . diltiazem (CARDIZEM) infusion 5 mg/hr (10/23/12 2027)  . heparin 1,200 Units/hr (10/23/12 1816)    Assessment: 71 yo female with afib and PE is currently on therapeutic heparin.  Heparin level was 0.56. Goal of Therapy:  Heparin level 0.3-0.7 units/ml Monitor platelets by anticoagulation protocol: Yes   Plan:  1) Continue heparin at 1200 units/hr 2) Daily heparin level and CBC  Retal Tonkinson, Tsz-Yin 10/24/2012,2:23 AM

## 2012-10-25 DIAGNOSIS — Z79899 Other long term (current) drug therapy: Secondary | ICD-10-CM

## 2012-10-25 DIAGNOSIS — J90 Pleural effusion, not elsewhere classified: Secondary | ICD-10-CM | POA: Diagnosis present

## 2012-10-25 DIAGNOSIS — Z7901 Long term (current) use of anticoagulants: Secondary | ICD-10-CM

## 2012-10-25 DIAGNOSIS — Z5181 Encounter for therapeutic drug level monitoring: Secondary | ICD-10-CM

## 2012-10-25 LAB — CBC
MCH: 30.1 pg (ref 26.0–34.0)
Platelets: 255 10*3/uL (ref 150–400)
RBC: 4.29 MIL/uL (ref 3.87–5.11)

## 2012-10-25 LAB — BASIC METABOLIC PANEL
CO2: 26 mEq/L (ref 19–32)
Calcium: 8.7 mg/dL (ref 8.4–10.5)
GFR calc non Af Amer: 72 mL/min — ABNORMAL LOW (ref >90)
Sodium: 138 mEq/L (ref 135–145)

## 2012-10-25 LAB — HEPARIN LEVEL (UNFRACTIONATED)
Heparin Unfractionated: 0.81 IU/mL — ABNORMAL HIGH (ref 0.30–0.70)
Heparin Unfractionated: 0.47 IU/mL (ref 0.30–0.70)

## 2012-10-25 MED ORDER — FUROSEMIDE 10 MG/ML IJ SOLN
20.0000 mg | Freq: Once | INTRAMUSCULAR | Status: AC
  Administered 2012-10-25: 20 mg via INTRAVENOUS

## 2012-10-25 MED ORDER — DILTIAZEM HCL 60 MG PO TABS
60.0000 mg | ORAL_TABLET | Freq: Four times a day (QID) | ORAL | Status: DC
  Administered 2012-10-25 – 2012-10-27 (×7): 60 mg via ORAL
  Filled 2012-10-25 (×11): qty 1

## 2012-10-25 MED ORDER — OXYCODONE HCL 5 MG PO TABS: 5.0000 mg | ORAL_TABLET | ORAL | Status: DC | PRN

## 2012-10-25 MED ORDER — METOPROLOL TARTRATE 25 MG PO TABS
25.0000 mg | ORAL_TABLET | Freq: Two times a day (BID) | ORAL | Status: DC
  Administered 2012-10-25 – 2012-10-26 (×2): 25 mg via ORAL
  Filled 2012-10-25 (×3): qty 1

## 2012-10-25 NOTE — Progress Notes (Addendum)
ANTICOAGULATION CONSULT NOTE - Follow-Up Consult  Pharmacy Consult for Coumadin / Heparin Indication: pulmonary embolus  Allergies  Allergen Reactions  . Meperidine Hcl Rash  . Penicillins Rash    Rash as child    Patient Measurements: Height: 5\' 6"  (167.6 cm) Weight: 230 lb 8 oz (104.554 kg) IBW/kg (Calculated) : 59.3  Heparin Dosing Weight: 83 kg  Vital Signs: Temp: 98 F (36.7 C) (11/01 0727) Temp src: Oral (11/01 0727) BP: 98/56 mmHg (11/01 0727) Pulse Rate: 97  (11/01 0727)  Labs:  Basename 10/25/12 0443 10/24/12 1629 10/24/12 0851 10/24/12 0136 10/23/12 1249  HGB 12.9 -- -- 12.6 --  HCT 38.9 -- -- 39.2 42.4  PLT 255 -- -- 259 238  APTT -- -- -- -- --  LABPROT -- 16.6* -- -- 14.4  INR -- 1.38 -- -- 1.14  HEPARINUNFRC 0.81* -- 0.49 0.56 --  CREATININE 0.80 -- -- 0.84 0.80  CKTOTAL -- -- -- -- --  CKMB -- -- -- -- --  TROPONINI -- -- -- -- --    Estimated Creatinine Clearance: 77.7 ml/min (by C-G formula based on Cr of 0.8).   Medical History: Past Medical History  Diagnosis Date  . Hyperlipidemia   . Fasting hyperglycemia     A1C  within normal limits  . Diverticulosis 2002    Dr. Russella Dar  . Personal history of colonic polyps 2002    tubular adenoma  . Tortuous colon   . Vitamin D deficiency   . Pancreatitis due to common bile duct stone   . Other abnormal glucose 2011    102    Medications:  Scheduled:     . aspirin EC  162 mg Oral Daily  . coumadin book   Does not apply Once  . furosemide      . furosemide  20 mg Intravenous BID  . potassium chloride  20 mEq Oral BID  . sodium chloride  3 mL Intravenous Q12H  . warfarin  7.5 mg Oral ONCE-1800  . warfarin   Does not apply Once  . Warfarin - Pharmacist Dosing Inpatient   Does not apply q1800  . DISCONTD: furosemide  20 mg Intravenous QAC breakfast  . DISCONTD: potassium chloride  40 mEq Oral Daily    Assessment: 71 yo female on heparin / Coumadin for bilateral PE. Heparin level was  slightly supratherapeutic this AM. Rate adjusted. Received first dose of coumadin last night. D2/5 overlap for PE.   Goal of Therapy:  Heparin level 0.3-0.7 INR 2-3 Monitor platelets by anticoagulation protocol: Yes   Plan:  1. Decrease IV heparin to 1050 units/hr.  2. Coumadin 7.5mg  PO x1 3. F/u with heparin level  Heparin level came back therapeutic this PM.  Will cont heparin at 1050 units/hr

## 2012-10-25 NOTE — Progress Notes (Signed)
[facility] Progress Note [place] TEAM 1 - Stepdown/ICU TEAM   [name] AVW:[id number] DOB: [birth year] DOA: 10/23/2012 PCP: Sanda Linger, MD  Brief narrative: 71 y.o. female who recently returned from [place] (there for 10 days). There she developed lower leg edema which was new for her and SOB with exertion. No SOB while sitting. She has not had chest pain. No fever, no chills. Is healthy and takes no medications at home. She was seen at her family dr office where she was found to be in a fib with RVR. She was sent to the ER from there for further management. In the ER a cardizem gtt was started as well as a heparin gtt.   Assessment/Plan:  Bilateral pulmonary embolism / small mobile R LE popliteal DVT Cont Heparin >> coumadin - consider transition to Xarelto once pt HD stable/afib controlled - I discussed the clinical scenario with PCCM who agreed that IVC filter not indicated/use not well defined in this situation (see Cards note - they discussed w/ Vasc Surgery as well)  Atrial fibrillation, rapid  cotinues to require IV cardizem - titrate meds and follow - hoping for resolution as PE burden is alleviated by body   Edema extremities  Due to DVT as well as supressed EF - improving   Acute Systolic CHF - in setting of Afib ECHO: Normal LV size with global hypokinesis - EF 40-45% - Normal RV size with mildly decreased systolic function - Moderate biatrial enlargement - consider repeating echo when HR is controlled to reassess EF - no gross volume overload appreciated at this time   Hypokalemia  Will order BID KCL as well.  - recheck in AM  Fasting hyperglycemia - normal A1c Noted in outpt setting - no acute concerns at this time   Code Status: FULL Disposition Plan: remain in SDU  Consultants: Cardiology - [place]  Antibiotics: none  DVT prophylaxis: Fully anticoagulated   HPI/Subjective: The patient is resting comfortably today.  She denies chest pain.   She reports her shortness of breath and lower stream the edema are both significantly improved.  She denies nausea vomiting fevers chills or abdominal pain.   Objective: Blood pressure 134/60, pulse 115, temperature 98 F (36.7 C), temperature source Oral, resp. rate 18, height 5\' 6"  (1.676 m), weight 104.554 kg (230 lb 8 oz), SpO2 93.00%.  Intake/Output Summary (Last 24 hours) at 10/25/12 1608 Last data filed at 10/25/12 1130  Gross per 24 hour  Intake 509.21 ml  Output    350 ml  Net 159.21 ml     Exam: General: No acute respiratory distress at rest  Lungs: Clear to auscultation bilaterally without wheezes or crackles Cardiovascular: Regular rate and rhythm without murmur gallop or rub normal S1 and S2 Abdomen: Nontender, nondistended, soft, bowel sounds positive, no rebound, no ascites, no appreciable mass Extremities: No significant cyanosis or clubbing - 1+ edema bilateral lower extremities  Data Reviewed: Basic Metabolic Panel:  Lab 10/25/12 8295 10/24/12 0136 10/23/12 1249  NA 138 141 141  K 3.6 3.4* 3.8  CL 102 105 106  CO2 26 24 22   GLUCOSE 118* 99 91  BUN 15 19 21   CREATININE 0.80 0.84 0.80  CALCIUM 8.7 8.8 9.6  MG -- -- --  PHOS -- -- --   CBC:  Lab 10/25/12 0443 10/24/12 0136 10/23/12 1249  WBC 8.4 8.5 8.2  NEUTROABS -- -- 6.2  HGB 12.9 12.6 14.0  HCT 38.9 39.2 42.4  MCV 90.7 90.7  91.4  PLT 255 259 238   BNP (last 3 results)  Basename 10/24/12 0136 10/23/12 1249  PROBNP 857.2* 1269.0*    Recent Results (from the past 240 hour(s))  MRSA PCR SCREENING     Status: Normal   Collection Time   10/23/12  8:04 PM      Component Value Range Status Comment   MRSA by PCR NEGATIVE  NEGATIVE Final      Studies:  Recent x-ray studies have been reviewed in detail by the Attending Physician  Scheduled Meds:  Reviewed in detail by the Attending Physician   Lonia Blood, MD [facility] Office  [phone] Pager  [phone]  On-Call/Text Page:      [email or web address]      password [email or web address]  If 7PM-7AM, please contact night-coverage [email or web address] Password [email or web address] 10/25/2012, 4:08 PM   LOS: 2 days

## 2012-10-25 NOTE — Progress Notes (Signed)
ANTICOAGULATION CONSULT NOTE - Follow-Up Consult  Pharmacy Consult for Coumadin / Heparin Indication: pulmonary embolus  Allergies  Allergen Reactions  . Meperidine Hcl Rash  . Penicillins Rash    Rash as child    Patient Measurements: Height: 5\' 6"  (167.6 cm) Weight: 230 lb 8 oz (104.554 kg) IBW/kg (Calculated) : 59.3  Heparin Dosing Weight: 83 kg  Vital Signs: Temp: 98.2 F (36.8 C) (11/01 0451) Temp src: Oral (11/01 0451) BP: 106/93 mmHg (11/01 0451) Pulse Rate: 127  (11/01 0451)  Labs:  Basename 10/25/12 0443 10/24/12 1629 10/24/12 0851 10/24/12 0136 10/23/12 1249  HGB 12.9 -- -- 12.6 --  HCT 38.9 -- -- 39.2 42.4  PLT 255 -- -- 259 238  APTT -- -- -- -- --  LABPROT -- 16.6* -- -- 14.4  INR -- 1.38 -- -- 1.14  HEPARINUNFRC 0.81* -- 0.49 0.56 --  CREATININE -- -- -- 0.84 0.80  CKTOTAL -- -- -- -- --  CKMB -- -- -- -- --  TROPONINI -- -- -- -- --    Estimated Creatinine Clearance: 74 ml/min (by C-G formula based on Cr of 0.84).   Medical History: Past Medical History  Diagnosis Date  . Hyperlipidemia   . Fasting hyperglycemia     A1C  within normal limits  . Diverticulosis 2002    Dr. Russella Dar  . Personal history of colonic polyps 2002    tubular adenoma  . Tortuous colon   . Vitamin D deficiency   . Pancreatitis due to common bile duct stone   . Other abnormal glucose 2011    102    Medications:  Scheduled:     . sodium chloride   Intravenous STAT  . aspirin EC  162 mg Oral Daily  . coumadin book   Does not apply Once  . furosemide      . furosemide  20 mg Intravenous BID  . potassium chloride  20 mEq Oral BID  . sodium chloride  3 mL Intravenous Q12H  . warfarin  7.5 mg Oral ONCE-1800  . warfarin   Does not apply Once  . Warfarin - Pharmacist Dosing Inpatient   Does not apply q1800  . DISCONTD: furosemide  20 mg Intravenous QAC breakfast  . DISCONTD: potassium chloride  40 mEq Oral Daily    Assessment: 71 yo female on heparin / Coumadin  for bilateral PE. Heparin level (0.81) is above-goal on 1200 units/hr.   Goal of Therapy:  Heparin level 0.3-0.7 INR 2-3 Monitor platelets by anticoagulation protocol: Yes   Plan:  1. Decrease IV heparin to 1050 units/hr.  2. Heparin level in 8 hours.   Lorre Munroe, PharmD 10/25/12, 05:30 AM

## 2012-10-25 NOTE — Progress Notes (Addendum)
Patient ID: [name], female   DOB: 10/12/1941, 71 y.o.   MRN: [id number]   SUBJECTIVE:   Patient is feeling better today. She does not sense the rapid atrial fibrillation. Her edema is decreasing. She's not having any significant shortness of breath. She is tolerating the medications. She has some left ventricular dysfunction. Right ventricular function is normal by echo. She has bilateral pulmonary emboli. There is some clot in her leg. I have spoken to Dr. Arbie Cookey who will give me some followup input concerning the clot load in her leg. Final decision will then be made as to whether or not an IVC filter should be considered.   Filed Vitals:   10/25/12 0025 10/25/12 0451 10/25/12 0727 10/25/12 1126  BP: 95/60 106/93 98/56 115/90  Pulse: 147 127 97 114  Temp: 98.2 F (36.8 C) 98.2 F (36.8 C) 98 F (36.7 C) 98 F (36.7 C)  TempSrc: Oral Oral Oral Oral  Resp: 18 19 18 18   Height:      Weight:      SpO2: 94% 97% 96% 93%    Intake/Output Summary (Last 24 hours) at 10/25/12 1327 Last data filed at 10/25/12 1000  Gross per 24 hour  Intake 551.21 ml  Output    350 ml  Net 201.21 ml    LABS: Basic Metabolic Panel:  Basename 10/25/12 0443 10/24/12 0136  NA 138 141  K 3.6 3.4*  CL 102 105  CO2 26 24  GLUCOSE 118* 99  BUN 15 19  CREATININE 0.80 0.84  CALCIUM 8.7 8.8  MG -- --  PHOS -- --   Liver Function Tests: No results found for this basename: AST:2,ALT:2,ALKPHOS:2,BILITOT:2,PROT:2,ALBUMIN:2 in the last 72 hours No results found for this basename: LIPASE:2,AMYLASE:2 in the last 72 hours CBC:  Basename 10/25/12 0443 10/24/12 0136 10/23/12 1249  WBC 8.4 8.5 --  NEUTROABS -- -- 6.2  HGB 12.9 12.6 --  HCT 38.9 39.2 --  MCV 90.7 90.7 --  PLT 255 259 --   Cardiac Enzymes: No results found for this basename: CKTOTAL:3,CKMB:3,CKMBINDEX:3,TROPONINI:3 in the last 72 hours BNP: No components found with this basename: POCBNP:3 D-Dimer: No results found for this  basename: DDIMER:2 in the last 72 hours Hemoglobin A1C: No results found for this basename: HGBA1C in the last 72 hours Fasting Lipid Panel: No results found for this basename: CHOL,HDL,LDLCALC,TRIG,CHOLHDL,LDLDIRECT in the last 72 hours Thyroid Function Tests:  Basename 10/24/12 0851  TSH 3.002  T4TOTAL --  T3FREE --  THYROIDAB --    RADIOLOGY: Dg Chest 2 View  10/23/2012  *RADIOLOGY REPORT*  Clinical Data: Shortness of breath.  CHEST - 2 VIEW  Comparison: July 28, 2008.  Findings: Cardiomediastinal silhouette appears normal.  Mild bilateral pleural effusions are noted with right greater than left. Old right rib fractures are noted.  Fluid is noted in the right minor fissure.  Mild central pulmonary vascular congestion is noted.  IMPRESSION: Mild central pulmonary vascular congestion.  Interval development of mild bilateral pleural effusions.   Original Report Authenticated By: Venita Sheffield., M.D.    Ct Angio Chest Pe W/cm &/or Wo Cm  10/23/2012  *RADIOLOGY REPORT*  Clinical Data: Leg edema.  Short of breath with exertion.  Recent Product manager.  CT ANGIOGRAPHY CHEST  Technique:  Multidetector CT imaging of the chest using the standard protocol during bolus administration of intravenous contrast. Multiplanar reconstructed images including MIPs were obtained and reviewed to evaluate the vascular anatomy.  Contrast: OMNIPAQUE IOHEXOL  350 MG/ML SOLN  Comparison: Radiography same day  Findings: Pulmonary arterial opacification is excellent.  The study positive for pulmonary emboli with multiple emboli being present in the pulmonary arterial tree on the right, particularly in the upper lobe branch vessels.  On the left, there is a small or clot burden, though there are numerous small emboli throughout the left pulmonary arterial tree as well.  There are bilateral pleural effusions layering dependently.  There are areas of atelectasis and both lungs.  No pericardial fluid.  No  mediastinal or hilar mass or adenopathy.  Scans in the upper abdomen are unremarkable.  IMPRESSION: The study is positive for bilateral pulmonary emboli, more extensive on the right than the left.  Overall clot burden would be described as moderate.  Bilateral effusions with dependent atelectasis.  Critical Value/emergent results were called by telephone at the time of interpretation on 10/23/2012 at 1900 hours to Dr. Clarene Duke, who verbally acknowledged these results.   Original Report Authenticated By: Thomasenia Sales, M.D.     PHYSICAL EXAM    Patient is oriented to person time and place. Affect is normal. She has 3 daughter-in-law's in the room. There is no jugulovenous distention. Lungs are clear. Respiratory effort is nonlabored. Cardiac exam reveals S1 and S2. The abdomen is soft. There is no significant peripheral edema in her legs at this time. There are no musculoskeletal deformities. There are no skin rashes.   TELEMETRY:   I have reviewed telemetry today October 25, 2012. She does have atrial fib in the rate is still increased.   ASSESSMENT AND PLAN:  Principal Problem:   *Bilateral pulmonary embolism      The patient is being appropriately anticoagulated. I have reviewed the actual venous Doppler images with Dr. Arbie Cookey. The size of the clot is small. He and I both feel very comfortable that the patient does not need an IVC filter. I have called back to the patient's room and described to her the data. I explained that she does not need an IVC filter.  Active Problems:   Dyspnea on exertion    Patient is feeling much better with her diuresis and treatment of pulmonary emboli.   Atrial fibrillation, rapid    Atrial fibrillation rate continues to be rapid. This should improve as she improves overall. Her blood pressure is on the low side. I'm hesitant to push any other meds that would lower her blood pressure. Digoxin could be used if needed.   There is an EKG from July the documents  normal sinus rhythm at that time. It appears that the atrial fib is definitely recent.   Edema extremities    Edema has improved.   Hypokalemia     Patient is receiving potassium in the labs were rechecked again tomorrow   Acute right-sided heart failure     The patient is stable in this regard. Her echo does not show any right ventricular dysfunction.   Systolic CHF, acute on chronic    The patient does have mild left ventricular systolic dysfunction. Hopefully this will improve when she is stabilized and her atrial fib rate is controlled. I'm sure we will try to get her back to sinus eventually.   Bilateral pleural effusion    The patient is diaries in. I expect her infusions to improve with diuresis   Anticoagulation management encounter     Careful attention is being paid to the patient's anticoagulation. She needs to be fully anticoagulated with heparin with the long  side of therapy even if her Coumadin becomes therapeutic. She has residual clot in her leg.   Willa Rough 10/25/2012 1:27 PM

## 2012-10-26 LAB — CBC
Hemoglobin: 12.9 g/dL (ref 12.0–15.0)
MCHC: 32.7 g/dL (ref 30.0–36.0)
RDW: 13.7 % (ref 11.5–15.5)

## 2012-10-26 LAB — BASIC METABOLIC PANEL
GFR calc Af Amer: 79 mL/min — ABNORMAL LOW (ref >90)
GFR calc non Af Amer: 68 mL/min — ABNORMAL LOW (ref >90)
Potassium: 3.7 mEq/L (ref 3.5–5.1)
Sodium: 139 mEq/L (ref 135–145)

## 2012-10-26 LAB — PROTIME-INR
INR: 1.24 (ref 0.00–1.49)
Prothrombin Time: 15.4 seconds — ABNORMAL HIGH (ref 11.6–15.2)

## 2012-10-26 LAB — HEPARIN LEVEL (UNFRACTIONATED): Heparin Unfractionated: 0.27 IU/mL — ABNORMAL LOW (ref 0.30–0.70)

## 2012-10-26 MED ORDER — METOPROLOL TARTRATE 50 MG PO TABS
50.0000 mg | ORAL_TABLET | Freq: Two times a day (BID) | ORAL | Status: DC
  Administered 2012-10-26 – 2012-10-29 (×6): 50 mg via ORAL
  Filled 2012-10-26 (×7): qty 1

## 2012-10-26 MED ORDER — RIVAROXABAN 15 MG PO TABS
15.0000 mg | ORAL_TABLET | Freq: Two times a day (BID) | ORAL | Status: DC
  Administered 2012-10-26 – 2012-10-29 (×6): 15 mg via ORAL
  Filled 2012-10-26 (×9): qty 1

## 2012-10-26 NOTE — Progress Notes (Signed)
ANTICOAGULATION CONSULT NOTE - Follow-Up Consult  Pharmacy Consult for Coumadin / Heparin >> Xarelto Indication: pulmonary embolus  Allergies  Allergen Reactions  . Meperidine Hcl Rash  . Penicillins Rash    Rash as child    Patient Measurements: Height: 5\' 6"  (167.6 cm) Weight: 220 lb 7.4 oz (100 kg) IBW/kg (Calculated) : 59.3  Heparin Dosing Weight: 83 kg  Vital Signs: Temp: 98.4 F (36.9 C) (11/02 1100) Temp src: Oral (11/02 1100) BP: 121/74 mmHg (11/02 1100) Pulse Rate: 73  (11/02 1100)  Labs:  Basename 10/26/12 0450 10/25/12 1225 10/25/12 0443 10/24/12 1629 10/24/12 0136  HGB 12.9 -- 12.9 -- --  HCT 39.5 -- 38.9 -- 39.2  PLT 273 -- 255 -- 259  APTT -- -- -- -- --  LABPROT 15.4* -- -- 16.6* --  INR 1.24 -- -- 1.38 --  HEPARINUNFRC 0.27* 0.47 0.81* -- --  CREATININE 0.84 -- 0.80 -- 0.84  CKTOTAL -- -- -- -- --  CKMB -- -- -- -- --  TROPONINI -- -- -- -- --    Estimated Creatinine Clearance: 72.3 ml/min (by C-G formula based on Cr of 0.84).   Medical History: Past Medical History  Diagnosis Date  . Hyperlipidemia   . Fasting hyperglycemia     A1C  within normal limits  . Diverticulosis 2002    Dr. Russella Dar  . Personal history of colonic polyps 2002    tubular adenoma  . Tortuous colon   . Vitamin D deficiency   . Pancreatitis due to common bile duct stone   . Other abnormal glucose 2011    102    Medications:  Scheduled:     . diltiazem  60 mg Oral Q6H  . furosemide  20 mg Intravenous Once  . metoprolol tartrate  50 mg Oral BID  . potassium chloride  20 mEq Oral BID  . rivaroxaban  15 mg Oral BID  . sodium chloride  3 mL Intravenous Q12H  . DISCONTD: aspirin EC  162 mg Oral Daily  . DISCONTD: metoprolol tartrate  25 mg Oral BID  . DISCONTD: Warfarin - Pharmacist Dosing Inpatient   Does not apply q1800    Assessment: 71 yo female on heparin / Coumadin for bilateral PE.  No bleeding noted.  Goal of Therapy:   Monitor platelets by  anticoagulation protocol: Yes   Plan:  1.Start xarelto 15 mg PO BID then 2. After first dose of xarelto DC heparin 3. DC heparin labs, follow up periodic CBC  Thank you for allowing pharmacy to be a part of this patients care team.  Lovenia Kim Pharm.D., BCPS Clinical Pharmacist 10/26/2012 3:05 PM Pager: [phone] Phone: [phone]

## 2012-10-26 NOTE — Progress Notes (Signed)
PROGRESS NOTE  Subjective:   Pt admitted with Afib and new bilateral PEs.   Feeling better.  Still tachycardic.   Objective:    Vital Signs:   Temp:  [97.7 F (36.5 C)-99.7 F (37.6 C)] 98.4 F (36.9 C) (11/02 1100) Pulse Rate:  [73-115] 73  (11/02 1100) Resp:  [16-20] 16  (11/02 0500) BP: (96-134)/(59-88) 121/74 mmHg (11/02 1100) SpO2:  [94 %-98 %] 96 % (11/02 1100) Weight:  [220 lb 7.4 oz (100 kg)] 220 lb 7.4 oz (100 kg) (11/02 0500)      24-hour weight change: Weight change:   Weight trends: Filed Weights   10/23/12 1955 Nov 13, 2012 0015 10/26/12 0500  Weight: 230 lb 8 oz (104.554 kg) 230 lb 8 oz (104.554 kg) 220 lb 7.4 oz (100 kg)    Intake/Output:  11/01 0701 - 11/02 0700 In: 1356.8 [P.O.:720; I.V.:636.8] Out: 1500 [Urine:1500] Total I/O In: 81 [I.V.:81] Out: -    Physical Exam: BP 121/74  Pulse 73  Temp 98.4 F (36.9 C) (Oral)  Resp 16  Ht 5\' 6"  (1.676 m)  Wt 220 lb 7.4 oz (100 kg)  BMI 35.58 kg/m2  SpO2 96%  General: Vital signs reviewed and noted. Well-developed, well-nourished, in no acute distress; alert, appropriate and cooperative .  Head: Normocephalic, atraumatic.  Eyes: conjunctivae/corneas clear.  EOM's intact.   Throat: normal  Neck: Supple. Normal carotids. No JVD  Lungs:  Clear to auscultation  Heart: Irregularly irregular,  With normal  S1 S2. No murmurs, gallops or rubs. Tachy   Abdomen:  Soft, non-tender, non-distended with normoactive bowel sounds. No hepatomegaly. No rebound/guarding. No abdominal masses.  Extremities: Distal pedal pulses are 2+ .  No edema.    Neurologic: A&O X3, CN II - XII are grossly intact. Motor strength is 5/5 in the all 4 extremities.  Psych: Responds to questions appropriately with normal affect.    Labs: BMET:  Basename 10/26/12 0450 10/25/12 0443  NA 139 138  K 3.7 3.6  CL 102 102  CO2 25 26  GLUCOSE 102* 118*  BUN 16 15  CREATININE 0.84 0.80  CALCIUM 8.7 8.7  MG -- --  PHOS -- --     Liver function tests: No results found for this basename: AST:2,ALT:2,ALKPHOS:2,BILITOT:2,PROT:2,ALBUMIN:2 in the last 72 hours No results found for this basename: LIPASE:2,AMYLASE:2 in the last 72 hours  CBC:  Basename 10/26/12 0450 10/25/12 0443  WBC 7.4 8.4  NEUTROABS -- --  HGB 12.9 12.9  HCT 39.5 38.9  MCV 91.4 90.7  PLT 273 255    Cardiac Enzymes: No results found for this basename: CKTOTAL:4,CKMB:4,TROPONINI:4 in the last 72 hours  Coagulation Studies:  Basename 10/26/12 0450 13-Nov-2012 1629  LABPROT 15.4* 16.6*  INR 1.24 1.38    Other:  Basename November 13, 2012 0851  TSH 3.002  T4TOTAL --  T3FREE --  THYROIDAB --   No results found for this basename: VITAMINB12,FOLATE,FERRITIN,TIBC,IRON,RETICCTPCT in the last 72 hours   A-fib    Medications:    Infusions:    . heparin 1,050 Units/hr (10/26/12 1300)  . DISCONTD: diltiazem (CARDIZEM) infusion 12.5 mg/hr (10/25/12 1130)    Scheduled Medications:    . diltiazem  60 mg Oral Q6H  . furosemide  20 mg Intravenous Once  . metoprolol tartrate  25 mg Oral BID  . potassium chloride  20 mEq Oral BID  . sodium chloride  3 mL Intravenous Q12H  . Warfarin - Pharmacist Dosing Inpatient   Does not apply q1800  .  DISCONTD: aspirin EC  162 mg Oral Daily  . DISCONTD: furosemide  20 mg Intravenous BID    Assessment/ Plan:    Bilateral pulmonary embolism (10/24/2012) Has multiple pulmonary emboli bilaterally. Continue heparin  Atrial fibrillation, rapid (10/23/2012) Likely due to the acute PEs.  Continue heparin / ? Xarelto Her rate is still fast,  Will increase Metoprolol to 50 BID.  Acute right-sided heart failure (10/24/2012) Secondary to PEs  Anticoagulation management encounter (10/25/2012)    Disposition:  Length of Stay: 3  Vesta Mixer, Montez Hageman., MD, [facility] 10/26/2012, 1:28 PM Office [phone] Pager [phone]

## 2012-10-26 NOTE — Progress Notes (Signed)
[facility] Progress Note [name]   [name] ZOX:[id number] DOB: [birth year] DOA: 10/23/2012 PCP: Sanda Linger, MD  Brief narrative: 71 y.o. female who recently returned from [place] (there for 10 days). There she developed lower leg edema which was new for her and SOB with exertion. No SOB while sitting. She has not had chest pain. No fever, no chills. Is healthy and takes no medications at home. She was seen at her family dr office where she was found to be in a fib with RVR. She was sent to the ER from there for further management. In the ER a cardizem gtt was started as well as a heparin gtt.   Assessment/Plan:  Bilateral pulmonary embolism / small mobile R LE popliteal DVT Cont Heparin >> Xarelto (begin Xarelto in AM) - I discussed the clinical scenario with PCCM who agreed that IVC filter not indicated/use not well defined in this situation (see Cards note - they discussed w/ Vasc Surgery as well)  Atrial fibrillation, rapid  Now off IV cardizem but HR not at goal - Cards is adjusting med tx - keep in SDU until rate consistently controlled - hoping for resolution as PE burden is alleviated by body   Edema extremities  Due to DVT as well as supressed EF - improving - will begin to ambulate in AM  Acute Systolic CHF - in setting of Afib ECHO: Normal LV size with global hypokinesis - EF 40-45% - Normal RV size with mildly decreased systolic function - Moderate biatrial enlargement - consider repeating echo when HR is controlled to reassess EF - no gross volume overload appreciated at this time   Hypokalemia  Corrected  Fasting hyperglycemia - normal A1c Noted in outpt setting - no acute concerns at this time   Code Status: FULL Disposition Plan: remain in SDU  Consultants: Cardiology - [place]  Antibiotics: none  DVT prophylaxis: Fully anticoagulated   HPI/Subjective: The patient is resting comfortably today.  She denies chest  pain, f/c, sob, n/v, or abdom pain.     Objective: Blood pressure 121/74, pulse 73, temperature 98.4 F (36.9 C), temperature source Oral, resp. rate 16, height 5\' 6"  (1.676 m), weight 100 kg (220 lb 7.4 oz), SpO2 96.00%.  Intake/Output Summary (Last 24 hours) at 10/26/12 1448 Last data filed at 10/26/12 1300  Gross per 24 hour  Intake  602.5 ml  Output   1150 ml  Net -547.5 ml     Exam: General: No acute respiratory distress at rest  Lungs: Clear to auscultation bilaterally without wheezes or crackles Cardiovascular: tachycardic and irregular - no gallup or rub appreciated Abdomen: Nontender, nondistended, soft, bowel sounds positive, no rebound, no ascites, no appreciable mass Extremities: No significant cyanosis or clubbing - 1+ edema bilateral lower extremities  Data Reviewed: Basic Metabolic Panel:  Lab 10/26/12 8119 10/25/12 0443 10/24/12 0136 10/23/12 1249  NA 139 138 141 141  K 3.7 3.6 3.4* 3.8  CL 102 102 105 106  CO2 25 26 24 22   GLUCOSE 102* 118* 99 91  BUN 16 15 19 21   CREATININE 0.84 0.80 0.84 0.80  CALCIUM 8.7 8.7 8.8 9.6  MG -- -- -- --  PHOS -- -- -- --   CBC:  Lab 10/26/12 0450 10/25/12 0443 10/24/12 0136 10/23/12 1249  WBC 7.4 8.4 8.5 8.2  NEUTROABS -- -- -- 6.2  HGB 12.9 12.9 12.6 14.0  HCT 39.5 38.9 39.2 42.4  MCV 91.4 90.7 90.7  91.4  PLT 273 255 259 238   BNP (last 3 results)  Basename 10/24/12 0136 10/23/12 1249  PROBNP 857.2* 1269.0*    Recent Results (from the past 240 hour(s))  MRSA PCR SCREENING     Status: Normal   Collection Time   10/23/12  8:04 PM      Component Value Range Status Comment   MRSA by PCR NEGATIVE  NEGATIVE Final      Studies:  Recent x-ray studies have been reviewed in detail by the Attending Physician  Scheduled Meds:  Reviewed in detail by the Attending Physician   Lonia Blood, MD [facility] Office  [phone] Pager [phone]  On-Call/Text Page:      [email or web address]      password  [email or web address]  If 7PM-7AM, please contact night-coverage [email or web address] Password [email or web address] 10/26/2012, 2:48 PM   LOS: 3 days

## 2012-10-27 LAB — BASIC METABOLIC PANEL
Chloride: 101 mEq/L (ref 96–112)
Creatinine, Ser: 0.74 mg/dL (ref 0.50–1.10)
GFR calc Af Amer: >90 mL/min (ref >90)
Potassium: 4.4 mEq/L (ref 3.5–5.1)

## 2012-10-27 LAB — CBC
Platelets: 290 10*3/uL (ref 150–400)
RDW: 13.6 % (ref 11.5–15.5)
WBC: 8 10*3/uL (ref 4.0–10.5)

## 2012-10-27 MED ORDER — DILTIAZEM HCL ER COATED BEADS 240 MG PO CP24
240.0000 mg | ORAL_CAPSULE | Freq: Every day | ORAL | Status: DC
  Administered 2012-10-27: 240 mg via ORAL
  Filled 2012-10-27 (×2): qty 1

## 2012-10-27 NOTE — Progress Notes (Signed)
[facility] Progress Note [place] TEAM 1 - Stepdown/ICU TEAM   [name] XLK:[id number] DOB: 05-14-1941 DOA: 10/23/2012 PCP: Sanda Linger, MD  Brief narrative: 71 y.o. female who recently returned from [place] (there for 10 days). While there she developed lower leg edema which was new for her and SOB with exertion. No SOB while sitting. She did not have chest pain. No fever, no chills. Is otherwise healthy and takes no medications at home. She was seen at her family dr office where she was found to be in a fib with RVR. She was sent to the ER for further management. In the ER a cardizem gtt was started as well as a heparin gtt.   Assessment/Plan:  Bilateral pulmonary embolism / small mobile R LE popliteal DVT Cont Xarelto - I discussed the clinical scenario with PCCM who agreed that IVC filter not indicated/use not well defined in this situation (see Cards note - they discussed w/ Vasc Surgery as well)  Atrial fibrillation, rapid  Now off IV cardizem but HR still not at goal - Cards is adjusting med tx again - keep in SDU until rate consistently controlled - hoping for resolution as PE burden is alleviated by body - begin to ambulate today and follow HR  Edema extremities  Due to DVT as well as supressed EF - improving - will begin to ambulate today  Acute Systolic CHF - in setting of Afib ECHO: Normal LV size with global hypokinesis - EF 40-45% - Normal RV size with mildly decreased systolic function - Moderate biatrial enlargement - consider repeating echo when HR is controlled to reassess EF - no gross volume overload appreciated at this time   Hypokalemia  Corrected  Fasting hyperglycemia - normal A1c Noted in outpt setting - no acute concerns at this time   Code Status: FULL Disposition Plan: will not transfer today due to possible d/c home in AM   Consultants: Cardiology - [place]  Antibiotics: none  DVT prophylaxis: Fully anticoagulated    HPI/Subjective: The patient is resting comfortably today.  She has no new complaints and is quite anxious to be discharged home.  She has not yet been up or ambulated.   Objective: Blood pressure 96/45, pulse 55, temperature 98.2 F (36.8 C), temperature source Axillary, resp. rate 20, height 5\' 6"  (1.676 m), weight 102.2 kg (225 lb 5 oz), SpO2 97.00%.  Intake/Output Summary (Last 24 hours) at 10/27/12 1255 Last data filed at 10/27/12 1241  Gross per 24 hour  Intake    530 ml  Output   1000 ml  Net   -470 ml     Exam: General: No acute respiratory distress at rest  Lungs: Clear to auscultation bilaterally without wheezes or crackles Cardiovascular: tachycardic and irregular - no gallup or rub appreciated Abdomen: Nontender, nondistended, soft, bowel sounds positive, no rebound, no ascites, no appreciable mass Extremities: No significant cyanosis or clubbing - no significant edema bilateral lower extremities  Data Reviewed: Basic Metabolic Panel:  Lab 10/27/12 3664 10/26/12 0450 10/25/12 0443 10/24/12 0136 10/23/12 1249  NA 134* 139 138 141 141  K 4.4 3.7 3.6 3.4* 3.8  CL 101 102 102 105 106  CO2 24 25 26 24 22   GLUCOSE 106* 102* 118* 99 91  BUN 17 16 15 19 21   CREATININE 0.74 0.84 0.80 0.84 0.80  CALCIUM 8.8 8.7 8.7 8.8 9.6  MG -- -- -- -- --  PHOS -- -- -- -- --   CBC:  Lab 10/27/12 0450 10/26/12 0450 10/25/12 0443 10/24/12 0136 10/23/12 1249  WBC 8.0 7.4 8.4 8.5 8.2  NEUTROABS -- -- -- -- 6.2  HGB 13.0 12.9 12.9 12.6 14.0  HCT 39.2 39.5 38.9 39.2 42.4  MCV 90.7 91.4 90.7 90.7 91.4  PLT 290 273 255 259 238   BNP (last 3 results)  Basename 10/24/12 0136 10/23/12 1249  PROBNP 857.2* 1269.0*    Recent Results (from the past 240 hour(s))  MRSA PCR SCREENING     Status: Normal   Collection Time   10/23/12  8:04 PM      Component Value Range Status Comment   MRSA by PCR NEGATIVE  NEGATIVE Final      Studies:  Recent x-ray studies have been reviewed in  detail by the Attending Physician  Scheduled Meds:  Reviewed in detail by the Attending Physician   Lonia Blood, MD [facility] Office  [phone] Pager [phone]  On-Call/Text Page:      [email or web address]      password [email or web address]  If 7PM-7AM, please contact night-coverage [email or web address] Password [email or web address] 10/27/2012, 12:55 PM   LOS: 4 days

## 2012-10-27 NOTE — Progress Notes (Addendum)
PROGRESS NOTE  Subjective:   Pt admitted with Afib and new bilateral PEs.  ECHO with EF 40-45% in setting of rapid AF.  Feeling better. Very eager to go home. HRs now 95-120 at rest. No dyspnea. Edema resolved.   Objective:    Vital Signs:   Temp:  [97.4 F (36.3 C)-98.4 F (36.9 C)] 97.4 F (36.3 C) (11/03 0731) Pulse Rate:  [44-141] 94  (11/03 0900) Resp:  [18-20] 20  (11/03 0346) BP: (101-121)/(46-97) 108/86 mmHg (11/03 0731) SpO2:  [93 %-99 %] 94 % (11/03 0900) Weight:  [102.2 kg (225 lb 5 oz)] 102.2 kg (225 lb 5 oz) (11/03 0346)  Last BM Date: 10/26/12   24-hour weight change: Weight change: 2.2 kg (4 lb 13.6 oz)  Weight trends: Filed Weights   11-23-2012 0015 10/26/12 0500 10/27/12 0346  Weight: 104.554 kg (230 lb 8 oz) 100 kg (220 lb 7.4 oz) 102.2 kg (225 lb 5 oz)    Intake/Output:  11/02 0701 - 11/03 0700 In: 601 [P.O.:480; I.V.:121] Out: 1000 [Urine:1000]     Physical Exam: BP 108/86  Pulse 94  Temp 97.4 F (36.3 C) (Axillary)  Resp 20  Ht 5\' 6"  (1.676 m)  Wt 102.2 kg (225 lb 5 oz)  BMI 36.37 kg/m2  SpO2 94%  General: Vital signs reviewed and noted. Well-developed, well-nourished, in no acute distress; alert, appropriate and cooperative .  Head: Normocephalic, atraumatic.  Eyes: conjunctivae/corneas clear.  EOM's intact.   Throat: normal  Neck: Supple. Normal carotids. No JVD  Lungs:  Clear to auscultation  Heart: Irregularly irregular,  With normal  S1 S2. No murmurs, gallops or rubs. Tachy   Abdomen:  Soft, non-tender, non-distended with normoactive bowel sounds. No hepatomegaly. No rebound/guarding. No abdominal masses.  Extremities: Distal pedal pulses are 2+ .  No edema.    Neurologic: A&O X3, CN II - XII are grossly intact. Motor strength is 5/5 in the all 4 extremities.  Psych: Responds to questions appropriately with normal affect.    Labs: BMET:  Basename 10/27/12 0450 10/26/12 0450  NA 134* 139  K 4.4 3.7  CL 101 102  CO2 24 25    GLUCOSE 106* 102*  BUN 17 16  CREATININE 0.74 0.84  CALCIUM 8.8 8.7  MG -- --  PHOS -- --    Liver function tests: No results found for this basename: AST:2,ALT:2,ALKPHOS:2,BILITOT:2,PROT:2,ALBUMIN:2 in the last 72 hours No results found for this basename: LIPASE:2,AMYLASE:2 in the last 72 hours  CBC:  Basename 10/27/12 0450 10/26/12 0450  WBC 8.0 7.4  NEUTROABS -- --  HGB 13.0 12.9  HCT 39.2 39.5  MCV 90.7 91.4  PLT 290 273    Cardiac Enzymes: No results found for this basename: CKTOTAL:4,CKMB:4,TROPONINI:4 in the last 72 hours  Coagulation Studies:  Basename 10/26/12 0450 2012-11-23 1629  LABPROT 15.4* 16.6*  INR 1.24 1.38    Other: No results found for this basename: TSH,T4TOTAL,FREET3,T3FREE,THYROIDAB in the last 72 hours No results found for this basename: VITAMINB12,FOLATE,FERRITIN,TIBC,IRON,RETICCTPCT in the last 72 hours   A-fib    Medications:    Infusions:    . [EXPIRED] heparin Stopped (10/26/12 1800)    Scheduled Medications:    . diltiazem  60 mg Oral Q6H  . metoprolol tartrate  50 mg Oral BID  . potassium chloride  20 mEq Oral BID  . rivaroxaban  15 mg Oral BID  . sodium chloride  3 mL Intravenous Q12H  . [DISCONTINUED] metoprolol tartrate  25 mg  Oral BID  . [DISCONTINUED] Warfarin - Pharmacist Dosing Inpatient   Does not apply q1800    Assessment/ Plan:    Bilateral pulmonary embolism (10/24/2012) Has multiple pulmonary emboli bilaterally. Continue Xarelto  Atrial fibrillation, rapid (10/23/2012) Likely due to the acute PEs.  Continue Xarelto Her rate is still somewhat fast on lopressor and cardizem and suspect it will jump up with ambulation,  Will increase cardizem to 240 daily.  Acute right-sided heart failure (10/24/2012) Secondary to PEs. Resolved   She is much improved. Still slightly tachycardic so will increase Cardizem (and changing to long-acting formulation). Nearing point where she can go home. Would like to see her  walk first and make sure HR and symptoms are ok with ambulation. IF so can go home today or tomorrow from our standpoint. Can f/u with me in HF clinic. If AF persists can plan DC-CV in 4-6 weeks.  Disposition:  Length of Stay: 4  Daniel Bensimhon,MD 10:50 AM

## 2012-10-28 ENCOUNTER — Encounter (HOSPITAL_COMMUNITY): Payer: Self-pay | Admitting: Anesthesiology

## 2012-10-28 ENCOUNTER — Encounter (HOSPITAL_COMMUNITY)
Admission: EM | Disposition: A | Discharge status: Home / Self Care | Payer: Self-pay | Source: Home / Self Care | Attending: Internal Medicine

## 2012-10-28 ENCOUNTER — Encounter (HOSPITAL_COMMUNITY): Payer: Self-pay | Admitting: Gastroenterology

## 2012-10-28 ENCOUNTER — Inpatient Hospital Stay (HOSPITAL_COMMUNITY): Payer: Medicare Other | Admitting: Anesthesiology

## 2012-10-28 DIAGNOSIS — I509 Heart failure, unspecified: Secondary | ICD-10-CM

## 2012-10-28 DIAGNOSIS — I4891 Unspecified atrial fibrillation: Secondary | ICD-10-CM

## 2012-10-28 HISTORY — PX: CARDIOVERSION: SHX1299

## 2012-10-28 HISTORY — PX: TEE WITHOUT CARDIOVERSION: SHX5443

## 2012-10-28 LAB — CBC
HCT: 40.7 % (ref 36.0–46.0)
Hemoglobin: 13.3 g/dL (ref 12.0–15.0)
RDW: 13.6 % (ref 11.5–15.5)
WBC: 7.3 10*3/uL (ref 4.0–10.5)

## 2012-10-28 SURGERY — ECHOCARDIOGRAM, TRANSESOPHAGEAL
Anesthesia: Moderate Sedation

## 2012-10-28 MED ORDER — AMIODARONE LOAD VIA INFUSION
150.0000 mg | Freq: Once | INTRAVENOUS | Status: AC
  Administered 2012-10-28: 150 mg via INTRAVENOUS
  Filled 2012-10-28: qty 83.34

## 2012-10-28 MED ORDER — SODIUM CHLORIDE 0.9 % IV SOLN
INTRAVENOUS | Status: DC
  Administered 2012-10-28: 18:00:00 via INTRAVENOUS

## 2012-10-28 MED ORDER — SODIUM CHLORIDE 0.9 % IJ SOLN: 3.0000 mL | INTRAMUSCULAR | Status: DC | PRN

## 2012-10-28 MED ORDER — AMIODARONE HCL IN DEXTROSE 360-4.14 MG/200ML-% IV SOLN
1.0000 mg/min | INTRAVENOUS | Status: AC
  Administered 2012-10-28: 1 mg/min via INTRAVENOUS
  Filled 2012-10-28 (×2): qty 200

## 2012-10-28 MED ORDER — FENTANYL CITRATE 0.05 MG/ML IJ SOLN
INTRAMUSCULAR | Status: AC
  Filled 2012-10-28: qty 4

## 2012-10-28 MED ORDER — PROPOFOL 10 MG/ML IV BOLUS
INTRAVENOUS | Status: DC | PRN
  Administered 2012-10-28: 70 mg via INTRAVENOUS

## 2012-10-28 MED ORDER — AMIODARONE HCL IN DEXTROSE 360-4.14 MG/200ML-% IV SOLN
0.5000 mg/min | INTRAVENOUS | Status: DC
  Administered 2012-10-28 – 2012-10-29 (×2): 0.5 mg/min via INTRAVENOUS
  Filled 2012-10-28 (×4): qty 200

## 2012-10-28 MED ORDER — DIPHENHYDRAMINE HCL 50 MG/ML IJ SOLN
INTRAMUSCULAR | Status: AC
  Filled 2012-10-28: qty 1

## 2012-10-28 MED ORDER — MORPHINE SULFATE 4 MG/ML IJ SOLN
INTRAMUSCULAR | Status: AC
  Filled 2012-10-28: qty 2

## 2012-10-28 MED ORDER — SODIUM CHLORIDE 0.9 % IV SOLN
INTRAVENOUS | Status: DC | PRN
  Administered 2012-10-28: 15:00:00 via INTRAVENOUS

## 2012-10-28 MED ORDER — SODIUM CHLORIDE 0.9 % IV SOLN
INTRAVENOUS | Status: DC
  Administered 2012-10-28: 500 mL via INTRAVENOUS

## 2012-10-28 MED ORDER — LIDOCAINE VISCOUS 2 % MT SOLN
OROMUCOSAL | Status: AC
  Filled 2012-10-28: qty 15

## 2012-10-28 MED ORDER — SODIUM CHLORIDE 0.9 % IV SOLN: 250.0000 mL | INTRAVENOUS | Status: DC

## 2012-10-28 MED ORDER — MIDAZOLAM HCL 10 MG/2ML IJ SOLN
INTRAMUSCULAR | Status: DC | PRN
  Administered 2012-10-28: 2 mg via INTRAVENOUS
  Administered 2012-10-28: 1 mg via INTRAVENOUS
  Administered 2012-10-28: 3 mg via INTRAVENOUS

## 2012-10-28 MED ORDER — HYDROMORPHONE HCL PF 1 MG/ML IJ SOLN
INTRAMUSCULAR | Status: AC
Start: 2012-10-28 — End: 2012-10-28
  Filled 2012-10-28: qty 2

## 2012-10-28 MED ORDER — SODIUM CHLORIDE 0.9 % IJ SOLN: 3.0000 mL | Freq: Two times a day (BID) | INTRAMUSCULAR | Status: DC

## 2012-10-28 MED ORDER — LIDOCAINE VISCOUS 2 % MT SOLN
OROMUCOSAL | Status: DC | PRN
  Administered 2012-10-28: 10 mL via OROMUCOSAL

## 2012-10-28 MED ORDER — MIDAZOLAM HCL 5 MG/ML IJ SOLN
INTRAMUSCULAR | Status: AC
  Filled 2012-10-28: qty 3

## 2012-10-28 NOTE — CV Procedure (Signed)
    TRANSESOPHAGEAL ECHOCARDIOGRAM GUIDED DIRECT CURRENT CARDIOVERSION  NAME:  [name]   MRN: [id number] DOB:  1941/05/08   ADMIT DATE: 10/23/2012  INDICATIONS: AF with RVR   PROCEDURE:   Informed consent was obtained prior to the procedure. The risks, benefits and alternatives for the procedure were discussed and the patient comprehended these risks.  Risks include, but are not limited to, cough, sore throat, vomiting, nausea, somnolence, esophageal and stomach trauma or perforation, bleeding, low blood pressure, aspiration, pneumonia, infection, trauma to the teeth and death.    After a procedural time-out, the patient was given 6 mg versed for moderate sedation.  The oropharynx was anesthetized 10 cc of topical 1% viscous lidocaine.  The transesophageal probe was inserted in the esophagus and stomach without difficulty and multiple views were obtained.    FINDINGS:  LEFT VENTRICLE: EF = 40% global HK  RIGHT VENTRICLE: Normal  LEFT ATRIUM: Dilated  LEFT ATRIAL APPENDAGE: Normal. No clot.  RIGHT ATRIUM: Dilated  AORTIC VALVE:  Trileaflet. Mildly calcified. Trivial AI  MITRAL VALVE:   Normal. Mild MR  TRICUSPID VALVE: Normal. Mild-Mod TR  PULMONIC VALVE: Not well visulaized  INTERATRIAL SEPTUM: No PFO  PERICARDIUM: Mild effusion  DESCENDING AORTA: Trivial plaque   CARDIOVERSION:     Indications:  Atrial Fibrillation  Procedure Details:  Once the TEE was complete, the patient had the defibrillator pads placed in the anterior and posterior position. The patient then underwent further sedation by the anesthesia service for cardioversion with 70 mg propofol. Once an appropriate level of sedation was achieved, the patient received a biphasic, synchronized 200J shock with no effect. A second shock was then delivered and patient went into a rapid atrial flutter. She then received a 3rd shock and she went back into atrial flutter. No apparent  complications.  COMPLICATIONS:    There were no immediate complications.   CONCLUSION:   1.  Unsuccessful TEE guided cardioversion.   Will discuss options with her and her family. We could either continue high-dose lopressor/cardizem or consider starting amiodarone to facilitate rate control and help with eventual cardioversion though she would likely have to be in the hospital another day or two for a load.   Truman Hayward 3:31 PM

## 2012-10-28 NOTE — Progress Notes (Signed)
  Echocardiogram Echocardiogram Transesophageal has been performed.  Analya Louissaint 10/28/2012, 3:20 PM

## 2012-10-28 NOTE — Progress Notes (Signed)
No consent order for TEE today with multiple attempts to notify. Ward Givens, NP to place order.

## 2012-10-28 NOTE — Transfer of Care (Signed)
Immediate Anesthesia Transfer of Care Note  Patient: [name]  Procedure(s) Performed: Procedure(s) (LRB) with comments: TRANSESOPHAGEAL ECHOCARDIOGRAM (TEE) (N/A) CARDIOVERSION (N/A)  Patient Location: PACU and Endoscopy Unit  Anesthesia Type:MAC  Level of Consciousness: sedated and patient cooperative  Airway & Oxygen Therapy: Patient Spontanous Breathing and Patient connected to nasal cannula oxygen  Post-op Assessment: Report given to PACU RN, Post -op Vital signs reviewed and stable and Patient moving all extremities X 4  Post vital signs: Reviewed and stable  Complications: No apparent anesthesia complications

## 2012-10-28 NOTE — Progress Notes (Signed)
PROGRESS NOTE  Subjective:   Pt admitted with Afib and new bilateral PEs.  ECHO with EF 40-45% in setting of rapid AF.  Feeling better. Very eager to go home.   HRs now 95-125 at rest. No dyspnea. Edema resolved. Walked yesterday and HRs into 130s.  Objective:    Vital Signs:   Temp:  [97.4 F (36.3 C)-98.3 F (36.8 C)] 97.6 F (36.4 C) (11/04 0730) Pulse Rate:  [44-103] 79  (11/04 0730) Resp:  [16-17] 17  (11/04 0730) BP: (93-136)/(45-118) 105/79 mmHg (11/04 0730) SpO2:  [93 %-99 %] 95 % (11/04 0730) Weight:  [102 kg (224 lb 13.9 oz)] 102 kg (224 lb 13.9 oz) (11/04 0500)  Last BM Date: 10/27/12   24-hour weight change: Weight change: -0.2 kg (-7.1 oz)  Weight trends: Filed Weights   10/26/12 0500 10/27/12 0346 10/28/12 0500  Weight: 100 kg (220 lb 7.4 oz) 102.2 kg (225 lb 5 oz) 102 kg (224 lb 13.9 oz)    Intake/Output:  11/03 0701 - 11/04 0700 In: 240 [P.O.:240] Out: 250 [Urine:250]     Physical Exam: BP 105/79  Pulse 79  Temp 97.6 F (36.4 C) (Oral)  Resp 17  Ht 5\' 6"  (1.676 m)  Wt 102 kg (224 lb 13.9 oz)  BMI 36.29 kg/m2  SpO2 95%  General: Vital signs reviewed and noted. Well-developed, well-nourished, in no acute distress; alert, appropriate and cooperative .  Head: Normocephalic, atraumatic.  Eyes: conjunctivae/corneas clear.  EOM's intact.   Throat: normal  Neck: Supple. Normal carotids. No JVD  Lungs:  Clear to auscultation  Heart: Irregularly irregular,  With normal  S1 S2. No murmurs, gallops or rubs. Tachy   Abdomen:  Soft, non-tender, non-distended with normoactive bowel sounds. No hepatomegaly. No rebound/guarding. No abdominal masses.  Extremities: Distal pedal pulses are 2+ .  No edema.    Neurologic: A&O X3, CN II - XII are grossly intact. Motor strength is 5/5 in the all 4 extremities.  Psych: Responds to questions appropriately with normal affect.    Labs: BMET:  Basename 10/27/12 0450 10/26/12 0450  NA 134* 139  K 4.4 3.7  CL  101 102  CO2 24 25  GLUCOSE 106* 102*  BUN 17 16  CREATININE 0.74 0.84  CALCIUM 8.8 8.7  MG -- --  PHOS -- --    Liver function tests: No results found for this basename: AST:2,ALT:2,ALKPHOS:2,BILITOT:2,PROT:2,ALBUMIN:2 in the last 72 hours No results found for this basename: LIPASE:2,AMYLASE:2 in the last 72 hours  CBC:  Basename 10/28/12 0500 10/27/12 0450  WBC 7.3 8.0  NEUTROABS -- --  HGB 13.3 13.0  HCT 40.7 39.2  MCV 91.7 90.7  PLT 304 290    Cardiac Enzymes: No results found for this basename: CKTOTAL:4,CKMB:4,TROPONINI:4 in the last 72 hours  Coagulation Studies:  Basename 10/26/12 0450  LABPROT 15.4*  INR 1.24    Other: No results found for this basename: TSH,T4TOTAL,FREET3,T3FREE,THYROIDAB in the last 72 hours No results found for this basename: VITAMINB12,FOLATE,FERRITIN,TIBC,IRON,RETICCTPCT in the last 72 hours   A-fib    Medications:    Infusions:    Scheduled Medications:    . diltiazem  240 mg Oral Daily  . metoprolol tartrate  50 mg Oral BID  . rivaroxaban  15 mg Oral BID  . sodium chloride  3 mL Intravenous Q12H  . [DISCONTINUED] diltiazem  60 mg Oral Q6H  . [DISCONTINUED] potassium chloride  20 mEq Oral BID    Assessment/ Plan:  Bilateral pulmonary embolism (10/24/2012) Has multiple pulmonary emboli bilaterally. Continue Xarelto  Atrial fibrillation, rapid (10/23/2012) Likely due to the acute PEs.  Continue Xarelto Her rate is still somewhat fast on lopressor and cardizem and it does jump up with ambulation  Acute right-sided heart failure (10/24/2012) Secondary to PEs. Resolved   She is much improved. Still tachycardic on high-dose AV-nodal blocking agents. We discussed increasing her AV nodal agents with DC-CV in 4-6 weeks versus TEE-guided DC-CV today (with the understanding that she may not hold sinus rhythm and there is very small risk of CVA). She will discuss with her husband. She is tentatively scheduled for TEE/DC-CV  at 230p. If they decided against this we can cancel. Would hold am cardizem in anticipation of DC-CV.   Hopefully she can go home early this evening in either case.  Can f/u with me in HF clinic.  Disposition:  Length of Stay: 5  Isaac Dubie,MD 7:58 AM

## 2012-10-28 NOTE — Anesthesia Preprocedure Evaluation (Addendum)
Anesthesia Evaluation  Patient identified by MRN, date of birth, ID band Patient awake    Reviewed: Allergy & Precautions, H&P , NPO status , Patient's Chart, lab work & pertinent test results, reviewed documented beta blocker date and time   Airway Mallampati: II TM Distance: >3 FB Neck ROM: Full    Dental  (+) Teeth Intact and Dental Advisory Given   Pulmonary shortness of breath,          Cardiovascular +CHF + dysrhythmias Atrial Fibrillation     Neuro/Psych    GI/Hepatic   Endo/Other    Renal/GU      Musculoskeletal   Abdominal   Peds  Hematology   Anesthesia Other Findings   Reproductive/Obstetrics                           Anesthesia Physical Anesthesia Plan  ASA: III  Anesthesia Plan: General   Post-op Pain Management:    Induction: Intravenous  Airway Management Planned: Mask  Additional Equipment:   Intra-op Plan:   Post-operative Plan:   Informed Consent: I have reviewed the patients History and Physical, chart, labs and discussed the procedure including the risks, benefits and alternatives for the proposed anesthesia with the patient or authorized representative who has indicated his/her understanding and acceptance.     Plan Discussed with: CRNA and Surgeon  Anesthesia Plan Comments:         Anesthesia Quick Evaluation

## 2012-10-28 NOTE — Progress Notes (Signed)
[facility] Progress Note [place] TEAM 1 - Stepdown/ICU TEAM   [name] ZOX:[id number] DOB: [birth year] DOA: 10/23/2012 PCP: Sanda Linger, MD  Brief narrative: 71 y.o. female who recently returned from [place] (there for 10 days). While there she developed lower leg edema which was new for her and SOB with exertion. No SOB while sitting. She did not have chest pain. No fever, no chills. Is otherwise healthy and takes no medications at home. She was seen at her family dr office where she was found to be in a fib with RVR. She was sent to the ER for further management. In the ER a cardizem gtt was started as well as a heparin gtt.   Assessment/Plan:  Bilateral pulmonary embolism / small mobile R LE popliteal DVT Cont Xarelto - I discussed the clinical scenario with PCCM who agreed that IVC filter not indicated/use not well defined in this situation (see Cards note - they discussed w/ Vasc Surgery as well)  Atrial fibrillation, rapid  Now off IV cardizem but HR still not at goal - Cards is adjusting med tx again - unfortunately a TEE [facility] today was unsuccessful in re-establishing NSR - amio to be initiated   Edema extremities  Due to DVT as well as supressed EF - improving - ambulate  Acute Systolic CHF - in setting of Afib ECHO: Normal LV size with global hypokinesis - EF 40-45% - Normal RV size with mildly decreased systolic function - Moderate biatrial enlargement - - no gross volume overload appreciated at this time   Hypokalemia  Corrected  Fasting hyperglycemia - normal A1c Noted in outpt setting - no acute concerns at this time   Code Status: FULL Disposition Plan: remain in SDU for initiation of amiodarone  Consultants: Cardiology - [place]  Antibiotics: none  DVT prophylaxis: Fully anticoagulated   HPI/Subjective: The patient is resting comfortably today.  She has no new complaints and is quite anxious to be discharged home.  She has ambulate.  She feels  modestly dyspneic w/ exertion, but is noted to develop tachycardia into the 130 range w/ ambulation.   Objective: Blood pressure 130/85, pulse 118, temperature 98.1 F (36.7 C), temperature source Oral, resp. rate 19, height 5\' 6"  (1.676 m), weight 102 kg (224 lb 13.9 oz), SpO2 97.00%.  Intake/Output Summary (Last 24 hours) at 10/28/12 1549 Last data filed at 10/28/12 1540  Gross per 24 hour  Intake    290 ml  Output    200 ml  Net     90 ml     Exam: General: No acute respiratory distress at rest  Lungs: Clear to auscultation bilaterally without wheezes or crackles Cardiovascular: irreg irregular - no gallup or rub appreciated Abdomen: Nontender, nondistended, soft, bowel sounds positive, no rebound, no ascites, no appreciable mass Extremities: No significant cyanosis or clubbing - no significant edema bilateral lower extremities  Data Reviewed: Basic Metabolic Panel:  Lab 10/27/12 8119 10/26/12 0450 10/25/12 0443 10/24/12 0136 10/23/12 1249  NA 134* 139 138 141 141  K 4.4 3.7 3.6 3.4* 3.8  CL 101 102 102 105 106  CO2 24 25 26 24 22   GLUCOSE 106* 102* 118* 99 91  BUN 17 16 15 19 21   CREATININE 0.74 0.84 0.80 0.84 0.80  CALCIUM 8.8 8.7 8.7 8.8 9.6  MG -- -- -- -- --  PHOS -- -- -- -- --   CBC:  Lab 10/28/12 0500 10/27/12 0450 10/26/12 0450 10/25/12 0443 10/24/12 0136 10/23/12 1249  WBC 7.3 8.0 7.4 8.4 8.5 --  NEUTROABS -- -- -- -- -- 6.2  HGB 13.3 13.0 12.9 12.9 12.6 --  HCT 40.7 39.2 39.5 38.9 39.2 --  MCV 91.7 90.7 91.4 90.7 90.7 --  PLT 304 290 273 255 259 --   BNP (last 3 results)  Basename 10/24/12 0136 10/23/12 1249  PROBNP 857.2* 1269.0*    Recent Results (from the past 240 hour(s))  MRSA PCR SCREENING     Status: Normal   Collection Time   10/23/12  8:04 PM      Component Value Range Status Comment   MRSA by PCR NEGATIVE  NEGATIVE Final      Studies:  Recent x-ray studies have been reviewed in detail by the Attending Physician  Scheduled  Meds:  Reviewed in detail by the Attending Physician   Lonia Blood, MD [facility] Office  [phone] Pager [phone]  On-Call/Text Page:      [email or web address]      password [email or web address]  If 7PM-7AM, please contact night-coverage [email or web address] Password [email or web address] 10/28/2012, 3:49 PM   LOS: 5 days

## 2012-10-28 NOTE — Progress Notes (Signed)
  Amiodarone Drug - Drug Interaction Consult Note  Recommendations: -There may be an increased risk of bradycardia with use of amiodarone with metoprolol.  -Patient is being appropriately monitored and no changes are suggested   Amiodarone is metabolized by the cytochrome P450 system and therefore has the potential to cause many drug interactions. Amiodarone has an average plasma half-life of 50 days (range 20 to 100 days).   There is potential for drug interactions to occur several weeks or months after stopping treatment and the onset of drug interactions may be slow after initiating amiodarone.   []  Statins: Increased risk of myopathy. Simvastatin- restrict dose to 20mg  daily. Other statins: counsel patients to report any muscle pain or weakness immediately.  []  Anticoagulants: Amiodarone can increase anticoagulant effect. Consider warfarin dose reduction. Patients should be monitored closely and the dose of anticoagulant altered accordingly, remembering that amiodarone levels take several weeks to stabilize.  []  Antiepileptics: Amiodarone can increase plasma concentration of phenytoin, phenytoin dose should be reduced. Note that small changes in phenytoin dose can result in large changes in phenytoin levels. Monitor patient closely and counsel on signs of toxicity.  [x]  Beta blockers: increased risk of bradycardia, AV block and myocardial depression. Sotalol - avoid concomitant use.  []   Calcium channel blockers (diltiazem and verapamil): increased risk of bradycardia, AV block and myocardial depression.  []   Cyclosporine: Amiodarone increases levels of cyclosporine. Reduced dose of cyclosporine is recommended.  []  Digoxin dose should be halved when amiodarone is started.  []  Diuretics: increased risk of cardiotoxicity if hypokalemia occurs.  []  Oral hypoglycemic agents (glyburide, glipizide, glimepiride): increased risk of hypoglycemia. Patient's glucose levels should be monitored  closely when initiating amiodarone therapy.   []  Drugs that prolong the QT interval: Concurrent therapy is contraindicated due to the increased risk of torsades de pointes; . Antibiotics: e.g. fluoroquinolones, erythromycin. . Antiarrhythmics: e.g. quinidine, procainamide, disopyramide, sotalol. . Antipsychotics: e.g. phenothiazines, haloperidol.  . Lithium, tricyclic antidepressants, and methadone.  Thank You, Harland German, Pharm D 10/28/2012 4:39 PM

## 2012-10-28 NOTE — Anesthesia Postprocedure Evaluation (Signed)
  Anesthesia Post-op Note  Patient: [name]  Procedure(s) Performed: Procedure(s) (LRB) with comments: TRANSESOPHAGEAL ECHOCARDIOGRAM (TEE) (N/A) CARDIOVERSION (N/A)  Patient Location: PACU and Endoscopy Unit  Anesthesia Type:MAC  Level of Consciousness: oriented and patient cooperative  Airway and Oxygen Therapy: Patient Spontanous Breathing and Patient connected to nasal cannula oxygen  Post-op Pain: none  Post-op Assessment: Post-op Vital signs reviewed, Patient's Cardiovascular Status Stable, Respiratory Function Stable, Patent Airway and Pain level controlled  Post-op Vital Signs: Reviewed and stable  Complications: No apparent anesthesia complications

## 2012-10-29 ENCOUNTER — Encounter (HOSPITAL_COMMUNITY): Payer: Self-pay | Admitting: Internal Medicine

## 2012-10-29 DIAGNOSIS — I2699 Other pulmonary embolism without acute cor pulmonale: Secondary | ICD-10-CM

## 2012-10-29 LAB — CBC
HCT: 41.7 % (ref 36.0–46.0)
Hemoglobin: 13.5 g/dL (ref 12.0–15.0)
MCHC: 32.4 g/dL (ref 30.0–36.0)
MCV: 92.5 fL (ref 78.0–100.0)
RDW: 13.8 % (ref 11.5–15.5)

## 2012-10-29 MED ORDER — RIVAROXABAN 15 MG PO TABS: 15.0000 mg | ORAL_TABLET | Freq: Two times a day (BID) | ORAL | Status: DC

## 2012-10-29 MED ORDER — RIVAROXABAN 20 MG PO TABS: 20.0000 mg | ORAL_TABLET | Freq: Every day | ORAL | Status: DC

## 2012-10-29 MED ORDER — METOPROLOL SUCCINATE ER 100 MG PO TB24: 100.0000 mg | ORAL_TABLET | Freq: Every day | ORAL | Status: DC

## 2012-10-29 MED ORDER — AMIODARONE LOAD VIA INFUSION
150.0000 mg | Freq: Once | INTRAVENOUS | Status: AC
  Administered 2012-10-29: 150 mg via INTRAVENOUS
  Filled 2012-10-29: qty 83.34

## 2012-10-29 MED ORDER — AMIODARONE HCL 200 MG PO TABS
200.0000 mg | ORAL_TABLET | Freq: Two times a day (BID) | ORAL | Status: DC
  Administered 2012-10-29: 200 mg via ORAL
  Filled 2012-10-29 (×2): qty 1

## 2012-10-29 MED ORDER — METOPROLOL SUCCINATE ER 50 MG PO TB24
50.0000 mg | ORAL_TABLET | ORAL | Status: AC
  Administered 2012-10-29: 50 mg via ORAL
  Filled 2012-10-29: qty 1

## 2012-10-29 MED ORDER — METOPROLOL SUCCINATE ER 100 MG PO TB24
100.0000 mg | ORAL_TABLET | Freq: Every day | ORAL | Status: DC
  Filled 2012-10-29: qty 1

## 2012-10-29 MED ORDER — AMIODARONE HCL 200 MG PO TABS: 200.0000 mg | ORAL_TABLET | Freq: Two times a day (BID) | ORAL | Status: DC

## 2012-10-29 NOTE — Progress Notes (Signed)
PROGRESS NOTE  Subjective:   Pt admitted with Afib and new bilateral PEs.  ECHO with EF 40-45% in setting of rapid AF.  Feeling better. Very eager to go home.   Yesterday TEE and DCCV completed. TEE EF 40% No clot. 10/28/12 Unsuccessful DCCV . Started on amiodarone drip and cardizem was stopped. HR 90-110 on tele (much improved)     Wants to go home. Denies dyspnea/orthopnea.  Objective:    Vital Signs:   Temp:  [97.2 F (36.2 C)-98.1 F (36.7 C)] 97.7 F (36.5 C) (11/05 0716) Pulse Rate:  [67-118] 67  (11/05 0800) Resp:  [15-28] 15  (11/05 0800) BP: (100-150)/(42-103) 100/80 mmHg (11/05 0800) SpO2:  [94 %-99 %] 94 % (11/05 0800) FiO2 (%):  [28 %] 28 % (11/04 1339)  Last BM Date: 10/27/12   24-hour weight change: Weight change:   Weight trends: Filed Weights   10/26/12 0500 10/27/12 0346 10/28/12 0500  Weight: 100 kg (220 lb 7.4 oz) 102.2 kg (225 lb 5 oz) 102 kg (224 lb 13.9 oz)    Intake/Output:  11/04 0701 - 11/05 0700 In: 553.2 [P.O.:240; I.V.:313.2] Out: 200 [Urine:200] Total I/O In: 26.7 [I.V.:26.7] Out: -    Physical Exam: BP 100/80  Pulse 67  Temp 97.7 F (36.5 C) (Oral)  Resp 15  Ht 5\' 6"  (1.676 m)  Wt 102 kg (224 lb 13.9 oz)  BMI 36.29 kg/m2  SpO2 94%  General: Vital signs reviewed and noted. Well-developed, well-nourished, in no acute distress; alert, appropriate and cooperative .  Head: Normocephalic, atraumatic.  Eyes: conjunctivae/corneas clear.  EOM's intact.   Throat: normal  Neck: Supple. Normal carotids. No JVD  Lungs:  Clear to auscultation  Heart: Irregularly irregular,  With normal  S1 S2. No murmurs, gallops or rubs. Tachy   Abdomen:  Soft, non-tender, non-distended with normoactive bowel sounds. No hepatomegaly. No rebound/guarding. No abdominal masses.  Extremities: Distal pedal pulses are 2+ .  No edema.    Neurologic: A&O X3, CN II - XII are grossly intact. Motor strength is 5/5 in the all 4 extremities.  Psych: Responds to  questions appropriately with normal affect.    Labs: BMET:  Basename 10/27/12 0450  NA 134*  K 4.4  CL 101  CO2 24  GLUCOSE 106*  BUN 17  CREATININE 0.74  CALCIUM 8.8  MG --  PHOS --    Liver function tests: No results found for this basename: AST:2,ALT:2,ALKPHOS:2,BILITOT:2,PROT:2,ALBUMIN:2 in the last 72 hours No results found for this basename: LIPASE:2,AMYLASE:2 in the last 72 hours  CBC:  Basename 10/29/12 0520 10/28/12 0500  WBC 8.1 7.3  NEUTROABS -- --  HGB 13.5 13.3  HCT 41.7 40.7  MCV 92.5 91.7  PLT 319 304    Cardiac Enzymes: No results found for this basename: CKTOTAL:4,CKMB:4,TROPONINI:4 in the last 72 hours  Coagulation Studies: No results found for this basename: LABPROT:5,INR:5 in the last 72 hours  Other: No results found for this basename: TSH,T4TOTAL,FREET3,T3FREE,THYROIDAB in the last 72 hours No results found for this basename: VITAMINB12,FOLATE,FERRITIN,TIBC,IRON,RETICCTPCT in the last 72 hours   A-fib    Medications:    Infusions:    . sodium chloride 20 mL/hr at 10/28/12 1800  . sodium chloride    . sodium chloride 500 mL (10/28/12 1408)  . [EXPIRED] amiodarone (NEXTERONE PREMIX) 360 mg/200 mL dextrose 1 mg/min (10/28/12 1831)   Followed by  . amiodarone (NEXTERONE PREMIX) 360 mg/200 mL dextrose 0.5 mg/min (10/28/12 2329)    Scheduled  Medications:    . [COMPLETED] amiodarone  150 mg Intravenous Once  . metoprolol tartrate  50 mg Oral BID  . rivaroxaban  15 mg Oral BID  . sodium chloride  3 mL Intravenous Q12H  . sodium chloride  3 mL Intravenous Q12H    Assessment/ Plan:    Bilateral pulmonary embolism (10/24/2012) Has multiple pulmonary emboli bilaterally. Continue Xarelto  Atrial fibrillation, rapid (10/23/2012) Rate 90-110. Continue Xarelto. Continue amiodarone drip and give 150 mg bolus now.  If rate better controlled will switch to po tomorrow.   Acute right-sided heart failure (10/24/2012) Secondary to PEs.  Resolved    Disposition:  Length of Stay: 6  Amy Clegg, NP-C   Patient seen and examined with Tonye Becket, NP-C. We discussed all aspects of the encounter. I agree with the assessment and plan as stated above.   Hr much improved on tele. She is eager to go home. I think we can stop IV amiodarone and let her go home on amio 200 bid and toprol 100 daily. (Would hold ACE-I at this point so as not to create hypotension). Continue Xarelto. Will see in HF clinic on Monday. If not back in SR in a few weeks can re-attempt DC-CV.  D/W Dr. Butler Denmark.  Gabriela Giannelli,MD 10:42 AM

## 2012-10-29 NOTE — Progress Notes (Addendum)
IV dated and slight redness noted at site.  No c/o pain.  IV team here to place new IV.  Unable to place new IV.  Dr. Butler Denmark notified and order for PICC line placed since pt on amiodarone gtt.  IV team paged.  PICC line cancelled, IV amiodarone to be switched to PO and pt IV to be d/ced 1 hour after dose given.

## 2012-10-29 NOTE — Progress Notes (Signed)
Order to discharge pt once amiodarone gtt d/ced.  Pt given oral amiodarone per order and gtt off after 1hour.  Tolerated without problems.  IV d/ced and reviewed s/s of infection at IV site.  Reviewed all d/c instructions using teachback and written copy of instructions including medications given to patient and husband.  Pt d/ced with husband and all belongings via WC.  Marked all meds to be taken tonight and when f/u appt with cardiologist and when to call 911.

## 2012-10-29 NOTE — Discharge Summary (Signed)
Physician Discharge Summary  [name] WUJ:[id number] DOB: [birth year] DOA: 10/23/2012  PCP: Sanda Linger, MD  Admit date: 10/23/2012 Discharge date: 10/29/2012  Time spent: 45 minutes  Discharge Diagnoses:  Principal Problem:  *Bilateral pulmonary embolism Active Problems:  Dyspnea on exertion  Atrial fibrillation, rapid  Edema extremities  Systolic CHF, acute on chronic  Bilateral pleural effusion  Hypokalemia  Discharge Condition: stable  Diet recommendation: heart healthy  Filed Weights   10/26/12 0500 10/27/12 0346 10/28/12 0500  Weight: 100 kg (220 lb 7.4 oz) 102.2 kg (225 lb 5 oz) 102 kg (224 lb 13.9 oz)    History of present illness:  71 y.o. female who recently returned from [place] (there for 10 days). While in [place] (about 1 wk into the trip), she developed lower leg edema and dyspnea with exertion. There was no dyspnea while at rest. She did not have chest pain. She waited until returning to the [place] before seeking medical attention. She was seen at her family physician where she was found to have Atiral fibrillation with RVR. She was sent to the ER for further management and found to have b/l pulmonary emboli. In the ER a Cardizem and Heparin infusions were initiated.   Hospital Course:  Bilateral pulmonary embolism / small mobile R LE popliteal DVT  Cont Xarelto - Dr Dolphus Jenny discussed the clinical scenario with PCCM who agreed that IVC filter was not indicated/use not well defined in this situation - Cardiology discussed this with Vascular surgery as well and the same conclusion was reached.  Atrial fibrillation, rapid  She initially received a Cardizem infusion and underwent an attempt at a TEE/DC  cardioversion which was unfortunately unsuccessful in re-establishing NSR - Amiodorone infusion was then initiated- She is going to be transitioned to oral Amiodarone today. She is already receiving Metoprolol which Dr Gala Romney will increase the Metoprolol from 50mg  BID   to Toprol XL 100 daily. We will give her a dose of Toprol XL 50 mg prior to her discharge.  The patient is advised to follow up with him next week. He plans on an outpt TEE/DC cardioversion in about 2 wks.   Edema extremities  Due to DVT as well as supressed EF - improved  Acute Systolic CHF - in setting of Afib  ECHO: Normal LV size with global hypokinesis - EF 40-45% - Normal RV size with mildly decreased systolic function - Moderate biatrial enlargement - - no gross volume overload appreciated at this time   Hypokalemia  Corrected   Fasting hyperglycemia - normal A1c  Noted in outpt setting - no acute concerns at this time    Procedures:  TEE guided DC cardioversion  Consultations:  Cardiology  Discharge Exam: Filed Vitals:   10/29/12 0324 10/29/12 0716 10/29/12 0800 10/29/12 1005  BP:  121/84 100/80 124/91  Pulse:  82 67 109  Temp: 97.5 F (36.4 C) 97.7 F (36.5 C)    TempSrc: Oral Oral    Resp:   15   Height:      Weight:      SpO2:  96% 94% 97%    General: alert, no acute distress, oriented x 3 Cardiovascular: IIRR, no murmurs- HR in 80-90s range at rest Respiratory: CTA b/l   Discharge Instructions  Discharge Orders    Future Appointments: Provider: Department: Dept Phone: Center:   11/04/2012 9:00 AM [facility] [phone] None     Future Orders Please Complete By Expires  Diet - low sodium heart healthy      Increase activity slowly          Medication List     As of 10/29/2012 11:07 AM    STOP taking these medications         aspirin EC 81 MG tablet      TAKE these medications         amiodarone 200 MG tablet   Commonly known as: PACERONE   Take 1 tablet (200 mg total) by mouth 2 (two) times daily.      metoprolol succinate 100 MG 24 hr tablet   Commonly known as: TOPROL-XL   Take 1 tablet (100 mg total) by mouth daily. Take with or immediately following a meal.       Rivaroxaban 15 MG Tabs tablet   Commonly known as: XARELTO   Take 1 tablet (15 mg total) by mouth 2 (two) times daily.      Rivaroxaban 20 MG Tabs   Commonly known as: XARELTO   Take 1 tablet (20 mg total) by mouth daily.   Start taking on: 11/20/2012           Follow-up Information    Follow up with Arvilla Meres, MD. On 11/04/2012. (at 9:00 Garage Code 8000)    Contact information:   [address] [place] [address] [phone]           The results of significant diagnostics from this hospitalization (including imaging, microbiology, ancillary and laboratory) are listed below for reference.    Significant Diagnostic Studies: Dg Chest 2 View  10/23/2012  *RADIOLOGY REPORT*  Clinical Data: Shortness of breath.  CHEST - 2 VIEW  Comparison: July 28, 2008.  Findings: Cardiomediastinal silhouette appears normal.  Mild bilateral pleural effusions are noted with right greater than left. Old right rib fractures are noted.  Fluid is noted in the right minor fissure.  Mild central pulmonary vascular congestion is noted.  IMPRESSION: Mild central pulmonary vascular congestion.  Interval development of mild bilateral pleural effusions.   Original Report Authenticated By: Venita Sheffield., M.D.    Ct Angio Chest Pe W/cm &/or Wo Cm  10/23/2012  *RADIOLOGY REPORT*  Clinical Data: Leg edema.  Short of breath with exertion.  Recent Product manager.  CT ANGIOGRAPHY CHEST  Technique:  Multidetector CT imaging of the chest using the standard protocol during bolus administration of intravenous contrast. Multiplanar reconstructed images including MIPs were obtained and reviewed to evaluate the vascular anatomy.  Contrast: OMNIPAQUE IOHEXOL 350 MG/ML SOLN  Comparison: Radiography same day  Findings: Pulmonary arterial opacification is excellent.  The study positive for pulmonary emboli with multiple emboli being present in the pulmonary arterial tree on the right,  particularly in the upper lobe branch vessels.  On the left, there is a small or clot burden, though there are numerous small emboli throughout the left pulmonary arterial tree as well.  There are bilateral pleural effusions layering dependently.  There are areas of atelectasis and both lungs.  No pericardial fluid.  No mediastinal or hilar mass or adenopathy.  Scans in the upper abdomen are unremarkable.  IMPRESSION: The study is positive for bilateral pulmonary emboli, more extensive on the right than the left.  Overall clot burden would be described as moderate.  Bilateral effusions with dependent atelectasis.  Critical Value/emergent results were called by telephone at the time of interpretation on 10/23/2012 at 1900 hours to Dr. Clarene Duke, who verbally acknowledged  these results.   Original Report Authenticated By: Thomasenia Sales, M.D.     Microbiology: Recent Results (from the past 240 hour(s))  MRSA PCR SCREENING     Status: Normal   Collection Time   10/23/12  8:04 PM      Component Value Range Status Comment   MRSA by PCR NEGATIVE  NEGATIVE Final      Labs: Basic Metabolic Panel:  Lab 10/27/12 1478 10/26/12 0450 10/25/12 0443 10/24/12 0136 10/23/12 1249  NA 134* 139 138 141 141  K 4.4 3.7 3.6 3.4* 3.8  CL 101 102 102 105 106  CO2 24 25 26 24 22   GLUCOSE 106* 102* 118* 99 91  BUN 17 16 15 19 21   CREATININE 0.74 0.84 0.80 0.84 0.80  CALCIUM 8.8 8.7 8.7 8.8 9.6  MG -- -- -- -- --  PHOS -- -- -- -- --   Liver Function Tests: No results found for this basename: AST:5,ALT:5,ALKPHOS:5,BILITOT:5,PROT:5,ALBUMIN:5 in the last 168 hours No results found for this basename: LIPASE:5,AMYLASE:5 in the last 168 hours No results found for this basename: AMMONIA:5 in the last 168 hours CBC:  Lab 10/29/12 0520 10/28/12 0500 10/27/12 0450 10/26/12 0450 10/25/12 0443 10/23/12 1249  WBC 8.1 7.3 8.0 7.4 8.4 --  NEUTROABS -- -- -- -- -- 6.2  HGB 13.5 13.3 13.0 12.9 12.9 --  HCT 41.7 40.7 39.2  39.5 38.9 --  MCV 92.5 91.7 90.7 91.4 90.7 --  PLT 319 304 290 273 255 --   Cardiac Enzymes: No results found for this basename: CKTOTAL:5,CKMB:5,CKMBINDEX:5,TROPONINI:5 in the last 168 hours BNP: BNP (last 3 results)  Basename 10/24/12 0136 10/23/12 1249  PROBNP 857.2* 1269.0*   CBG: No results found for this basename: GLUCAP:5 in the last 168 hours     Signed:  Branda Chaudhary  [facility] 10/29/2012, 11:07 AM

## 2012-10-30 ENCOUNTER — Telehealth (HOSPITAL_COMMUNITY): Payer: Self-pay | Admitting: *Deleted

## 2012-10-30 NOTE — Telephone Encounter (Signed)
[employer] called to let us know that pt's Xarelto will need prior auth, they did give pt 3 tabs for now, pt is on 15 mg bid loading dose then will go to 20 mg daily on 11/27, called [phone] for PA, dx PE, 415.1 and both doses were approved through 10/30/2013, [facility] is aware

## 2012-11-04 ENCOUNTER — Other Ambulatory Visit (HOSPITAL_COMMUNITY): Payer: Self-pay | Admitting: Adult Health

## 2012-11-04 ENCOUNTER — Encounter (HOSPITAL_COMMUNITY): Payer: Self-pay | Admitting: *Deleted

## 2012-11-04 ENCOUNTER — Encounter (HOSPITAL_COMMUNITY): Payer: Self-pay

## 2012-11-04 ENCOUNTER — Ambulatory Visit (HOSPITAL_COMMUNITY)
Admit: 2012-11-04 | Discharge: 2012-11-04 | Disposition: A | Discharge status: Home / Self Care | Payer: Medicare Other | Attending: Internal Medicine | Admitting: Internal Medicine

## 2012-11-04 VITALS — BP 106/82 | HR 108 | Wt 227.1 lb

## 2012-11-04 DIAGNOSIS — I5023 Acute on chronic systolic (congestive) heart failure: Secondary | ICD-10-CM

## 2012-11-04 DIAGNOSIS — I4819 Other persistent atrial fibrillation: Secondary | ICD-10-CM | POA: Insufficient documentation

## 2012-11-04 DIAGNOSIS — I4891 Unspecified atrial fibrillation: Secondary | ICD-10-CM

## 2012-11-04 DIAGNOSIS — I2699 Other pulmonary embolism without acute cor pulmonale: Secondary | ICD-10-CM

## 2012-11-04 NOTE — Patient Instructions (Addendum)

## 2012-11-04 NOTE — Progress Notes (Signed)
Patient ID: [name], female   DOB: 09/09/1941, 71 y.o.   MRN: [id number] HPI: [name] has a PMH with A-Fib, new bilateral PEs, and hyperlipidemia.   D/C [facility] 10/28/12.  ECHO with EF 40-45% in setting of rapid AF. Feeling better. 10/27/12 TEE and DCCV completed. TEE EF 40% No clot. 10/28/12 Unsuccessful DCCV . Started on amiodarone drip and cardizem was stopped. HR 90-110 on tele. Discharged on amio 200 bid and toprol 100 daily. Ace held to reduce hypotension. Continue Xarelto.  She returns for post hospitalization follow up. Complains of fatigue with exertion. Still having difficulty sleeping.  Denies SOB/PND/Orthopnea/CP/dizziness. Appetite fair. Swelling in ankles. Complaint with medications. Denies bruising.    ROS: All systems negative except as listed in HPI, PMH and Problem List.  Past Medical History  Diagnosis Date  . Hyperlipidemia   . Fasting hyperglycemia     A1C  within normal limits  . Diverticulosis 2002    Dr. Stark  . Personal history of colonic polyps 2002    tubular adenoma  . Tortuous colon   . Vitamin D deficiency   . Pancreatitis due to common bile duct stone   . Other abnormal glucose 2011    102    Current Outpatient Prescriptions  Medication Sig Dispense Refill  . amiodarone (PACERONE) 200 MG tablet Take 1 tablet (200 mg total) by mouth 2 (two) times daily.  60 tablet  0  . metoprolol succinate (TOPROL-XL) 100 MG 24 hr tablet Take 1 tablet (100 mg total) by mouth daily. Take with or immediately following a meal.  30 tablet  0  . Rivaroxaban (XARELTO) 15 MG TABS tablet Take 1 tablet (15 mg total) by mouth 2 (two) times daily.  21 tablet  0  . Rivaroxaban (XARELTO) 20 MG TABS Take 1 tablet (20 mg total) by mouth daily.  30 tablet  0     PHYSICAL EXAM: Filed Vitals:   11/04/12 0857  BP: 106/82  Pulse: 108   Weight change:  General:  Well appearing. No resp difficulty (husband present) HEENT: normal Neck: supple. JVP flat. Carotids 2+  bilaterally; no bruits. No lymphadenopathy or thryomegaly appreciated. Cor: PMI normal. Irregular rate & rhythm. No rubs, gallops or murmurs. Lungs: clear Abdomen: soft, nontender, nondistended. No hepatosplenomegaly. No bruits or masses. Good bowel sounds. Extremities: no cyanosis, clubbing, rash, edema Neuro: alert & orientedx3, cranial nerves grossly intact. Moves all 4 extremities w/o difficulty. Affect pleasant.    ECG: A fib BPM 90   ASSESSMENT & PLAN:  

## 2012-11-04 NOTE — Assessment & Plan Note (Signed)
Continue Xarelto 

## 2012-11-04 NOTE — Assessment & Plan Note (Addendum)
Remains in A-fib. Rate controlled (90). Not tolerating A-Fib due to profound fatigue. Conitnue amiodarone and xarelto. Plan DC-CV later this week.   Patient seen and examined with Tonye Becket, NP. We discussed all aspects of the encounter. I agree with the assessment and plan as stated above.  Suspect fatigue due to AF. Will reattempt DC-CV now that she has been loaded with amio. Continue xarelto. Hold metoprolol on am of DC-CV.

## 2012-11-05 NOTE — Addendum Note (Signed)
Encounter addended by: Almedia Balls on: 11/05/2012  9:14 AM<BR>     Documentation filed: Charges VN

## 2012-11-06 ENCOUNTER — Encounter (HOSPITAL_COMMUNITY): Payer: Self-pay | Admitting: Anesthesiology

## 2012-11-06 ENCOUNTER — Encounter (HOSPITAL_COMMUNITY): Payer: Self-pay | Admitting: *Deleted

## 2012-11-06 ENCOUNTER — Ambulatory Visit (HOSPITAL_COMMUNITY)
Admission: RE | Admit: 2012-11-06 | Discharge: 2012-11-06 | Disposition: A | Discharge status: Home / Self Care | Payer: Medicare Other | Source: Ambulatory Visit | Attending: Internal Medicine | Admitting: Internal Medicine

## 2012-11-06 ENCOUNTER — Ambulatory Visit (HOSPITAL_COMMUNITY): Payer: Medicare Other | Admitting: Anesthesiology

## 2012-11-06 ENCOUNTER — Encounter (HOSPITAL_COMMUNITY)
Admission: RE | Disposition: A | Discharge status: Home / Self Care | Payer: Self-pay | Source: Ambulatory Visit | Attending: Internal Medicine

## 2012-11-06 DIAGNOSIS — I4891 Unspecified atrial fibrillation: Secondary | ICD-10-CM

## 2012-11-06 DIAGNOSIS — E785 Hyperlipidemia, unspecified: Secondary | ICD-10-CM | POA: Insufficient documentation

## 2012-11-06 HISTORY — PX: CARDIOVERSION: SHX1299

## 2012-11-06 SURGERY — CARDIOVERSION
Anesthesia: Monitor Anesthesia Care

## 2012-11-06 MED ORDER — PROPOFOL 10 MG/ML IV BOLUS
INTRAVENOUS | Status: DC | PRN
  Administered 2012-11-06: 100 mg via INTRAVENOUS

## 2012-11-06 MED ORDER — LIDOCAINE HCL (CARDIAC) 20 MG/ML IV SOLN
INTRAVENOUS | Status: DC | PRN
  Administered 2012-11-06: 50 mg via INTRAVENOUS

## 2012-11-06 MED ORDER — SODIUM CHLORIDE 0.9 % IV SOLN
INTRAVENOUS | Status: DC
  Administered 2012-11-06: 500 mL via INTRAVENOUS

## 2012-11-06 NOTE — CV Procedure (Signed)
     DIRECT CURRENT CARDIOVERSION  NAME:  [name]   MRN: [id number] DOB:  Dec 15, 1941   ADMIT DATE: 11/06/2012   INDICATIONS: Atrial fibrillation    PROCEDURE:   Informed consent was obtained prior to the procedure. The risks, benefits and alternatives for the procedure were discussed and the patient comprehended these risks. Once an appropriate time out was taken, the patient had the defibrillator pads placed in the anterior and posterior position. The patient then underwent sedation by the anesthesia service with 100mg  intravenous propofol. Once an appropriate level of sedation was achieved, the patient received a single biphasic, synchronized 200J shock with prompt conversion to sinus bradycardia. No apparent complications.   CONLCUSION:   1.  Successful DC-CV  Truman Hayward 1:43 PM

## 2012-11-06 NOTE — Interval H&P Note (Signed)
History and Physical Interval Note:  11/06/2012 1:38 PM  [name]  has presented today for surgery, with the diagnosis of a-fib  The various methods of treatment have been discussed with the patient and family. After consideration of risks, benefits and other options for treatment, the patient has consented to  Procedure(s) (LRB) with comments: CARDIOVERSION (N/A) as a surgical intervention .  The patient's history has been reviewed, patient examined, no change in status, stable for surgery.  I have reviewed the patient's chart and labs.  Questions were answered to the patient's satisfaction.     [name]

## 2012-11-06 NOTE — Anesthesia Preprocedure Evaluation (Addendum)
Anesthesia Evaluation  Patient identified by MRN, date of birth, ID band Patient awake    Reviewed: Allergy & Precautions, H&P , NPO status , Patient's Chart, lab work & pertinent test results  History of Anesthesia Complications Negative for: history of anesthetic complications  Airway Mallampati: I TM Distance: >3 FB Neck ROM: Full    Dental   Pulmonary shortness of breath and with exertion, PE bilat pulm effusions         Cardiovascular +CHF + dysrhythmias Atrial Fibrillation  Ef 40 % global HK trivial aortic regurg mild mitral regurg    Neuro/Psych    GI/Hepatic Diverticulosis, pancreatitis   Endo/Other    Renal/GU      Musculoskeletal   Abdominal   Peds  Hematology   Anesthesia Other Findings   Reproductive/Obstetrics                         Anesthesia Physical Anesthesia Plan  ASA: III  Anesthesia Plan: General   Post-op Pain Management:    Induction: Intravenous  Airway Management Planned: Mask  Additional Equipment:   Intra-op Plan:   Post-operative Plan:   Informed Consent: I have reviewed the patients History and Physical, chart, labs and discussed the procedure including the risks, benefits and alternatives for the proposed anesthesia with the patient or authorized representative who has indicated his/her understanding and acceptance.     Plan Discussed with: CRNA and Surgeon  Anesthesia Plan Comments:        Anesthesia Quick Evaluation

## 2012-11-06 NOTE — Transfer of Care (Signed)
Immediate Anesthesia Transfer of Care Note  Patient: [name]  Procedure(s) Performed: Procedure(s) (LRB) with comments: CARDIOVERSION (N/A)  Patient Location: PACU and Endoscopy Unit  Anesthesia Type:General  Level of Consciousness: awake, alert , oriented and patient cooperative  Airway & Oxygen Therapy: Patient Spontanous Breathing and Patient connected to nasal cannula oxygen  Post-op Assessment: Report given to PACU RN, Post -op Vital signs reviewed and stable and Patient moving all extremities  Post vital signs: Reviewed and stable  Complications: No apparent anesthesia complications

## 2012-11-06 NOTE — H&P (View-Only) (Signed)
Patient ID: [name], female   DOB: [birth year], 71 y.o.   MRN: [id number] HPI: [name] has a PMH with A-Fib, new bilateral PEs, and hyperlipidemia.   D/C [facility] 10/28/12.  ECHO with EF 40-45% in setting of rapid AF. Feeling better. 10/27/12 TEE and DCCV completed. TEE EF 40% No clot. 10/28/12 Unsuccessful DCCV . Started on amiodarone drip and cardizem was stopped. HR 90-110 on tele. Discharged on amio 200 bid and toprol 100 daily. Ace held to reduce hypotension. Continue Xarelto.  She returns for post hospitalization follow up. Complains of fatigue with exertion. Still having difficulty sleeping.  Denies SOB/PND/Orthopnea/CP/dizziness. Appetite fair. Swelling in ankles. Complaint with medications. Denies bruising.    ROS: All systems negative except as listed in HPI, PMH and Problem List.  Past Medical History  Diagnosis Date  . Hyperlipidemia   . Fasting hyperglycemia     A1C  within normal limits  . Diverticulosis 2002    Dr. Russella Dar  . Personal history of colonic polyps 2002    tubular adenoma  . Tortuous colon   . Vitamin D deficiency   . Pancreatitis due to common bile duct stone   . Other abnormal glucose 2011    102    Current Outpatient Prescriptions  Medication Sig Dispense Refill  . amiodarone (PACERONE) 200 MG tablet Take 1 tablet (200 mg total) by mouth 2 (two) times daily.  60 tablet  0  . metoprolol succinate (TOPROL-XL) 100 MG 24 hr tablet Take 1 tablet (100 mg total) by mouth daily. Take with or immediately following a meal.  30 tablet  0  . Rivaroxaban (XARELTO) 15 MG TABS tablet Take 1 tablet (15 mg total) by mouth 2 (two) times daily.  21 tablet  0  . Rivaroxaban (XARELTO) 20 MG TABS Take 1 tablet (20 mg total) by mouth daily.  30 tablet  0     PHYSICAL EXAM: Filed Vitals:   11/04/12 0857  BP: 106/82  Pulse: 108   Weight change:  General:  Well appearing. No resp difficulty (husband present) HEENT: normal Neck: supple. JVP flat. Carotids 2+  bilaterally; no bruits. No lymphadenopathy or thryomegaly appreciated. Cor: PMI normal. Irregular rate & rhythm. No rubs, gallops or murmurs. Lungs: clear Abdomen: soft, nontender, nondistended. No hepatosplenomegaly. No bruits or masses. Good bowel sounds. Extremities: no cyanosis, clubbing, rash, edema Neuro: alert & orientedx3, cranial nerves grossly intact. Moves all 4 extremities w/o difficulty. Affect pleasant.    ECG: A fib BPM 90   ASSESSMENT & PLAN:

## 2012-11-06 NOTE — Anesthesia Postprocedure Evaluation (Signed)
Anesthesia Post Note  Patient: [name]  Procedure(s) Performed: Procedure(s) (LRB): CARDIOVERSION (N/A)  Anesthesia type: general  Patient location: PACU  Post pain: Pain level controlled  Post assessment: Patient's Cardiovascular Status Stable  Last Vitals:  Filed Vitals:   11/06/12 1300  BP: 126/74  Pulse:   Temp:   Resp: 16    Post vital signs: Reviewed and stable  Level of consciousness: sedated  Complications: No apparent anesthesia complications

## 2012-11-07 ENCOUNTER — Encounter (HOSPITAL_COMMUNITY): Payer: Self-pay | Admitting: Internal Medicine

## 2012-11-12 ENCOUNTER — Telehealth: Payer: Self-pay | Admitting: Internal Medicine

## 2012-11-12 MED ORDER — FUROSEMIDE 40 MG PO TABS: 40.0000 mg | ORAL_TABLET | Freq: Every day | ORAL | Status: DC

## 2012-11-12 MED ORDER — POTASSIUM CHLORIDE CRYS ER 20 MEQ PO TBCR: 20.0000 meq | EXTENDED_RELEASE_TABLET | Freq: Every day | ORAL | Status: DC

## 2012-11-12 NOTE — Telephone Encounter (Signed)
New Problem:    Patient call managed to make it to [place] [facility] from the after hours service when the morning phones were turned on. Patient called in with swollen ankles, increased weight and a tight stomach.  Please call back.

## 2012-11-12 NOTE — Telephone Encounter (Signed)
Per Dr Gala Romney, start pt on Lasix 40 mg daily and KCL 20 meq daily, pt is aware, rx sent in she will call back in a couple of days if not doing better

## 2012-11-12 NOTE — Telephone Encounter (Signed)
Spoke w/pt she states her abd is distended, edema in ankles, increased SOB with activity, she has gained 5 lb from yesterday to today, she states symptoms all started yesterday.  She is not on a diuretic, she can not tell if she went back into a-fib or not.  Will discuss w/Dr Bensimhon and call her back

## 2012-11-27 ENCOUNTER — Other Ambulatory Visit (HOSPITAL_COMMUNITY): Payer: Self-pay | Admitting: *Deleted

## 2012-11-27 ENCOUNTER — Telehealth: Payer: Self-pay | Admitting: Internal Medicine

## 2012-11-27 MED ORDER — AMIODARONE HCL 200 MG PO TABS: 200.0000 mg | ORAL_TABLET | Freq: Two times a day (BID) | ORAL | Status: DC

## 2012-11-27 MED ORDER — ZOLPIDEM TARTRATE 10 MG PO TABS: ORAL_TABLET | ORAL | Status: DC

## 2012-11-27 NOTE — Telephone Encounter (Signed)
Doe

## 2012-11-27 NOTE — Telephone Encounter (Signed)
refill Zolpidem Tartrate 10mg  #30 no instructions last fill 8.15.12, not on current meds listing, last ov wt/labs-V70 on 7.11.13 NOTE PCP listed says Sanda Linger, but pt saw Dr.Jones in October for acute condition only.

## 2012-11-27 NOTE — Telephone Encounter (Signed)
Message copied by Verner Chol on Wed Nov 27, 2012  2:50 PM ------      Message from: Maurice Small      Created: Wed Nov 27, 2012  2:25 PM       Please change PCP to [place], Thanks

## 2012-11-27 NOTE — Telephone Encounter (Signed)
1 every 3rd night prn , #30

## 2012-11-27 NOTE — Telephone Encounter (Signed)
RX called in .

## 2012-11-27 NOTE — Addendum Note (Signed)
Addended by: Maurice Small on: 11/27/2012 03:30 PM   Modules accepted: Orders

## 2012-11-27 NOTE — Telephone Encounter (Signed)
I called patient to verify primary care doctor for [name] is listed, last CPX was with [facility]. Patient's husband states [name] is patient's primary, patient seen Dr.Jones acutely.   [facility]opp please advise on refill request for Ambien (not on medication list)

## 2012-12-05 ENCOUNTER — Ambulatory Visit (HOSPITAL_COMMUNITY)
Admission: RE | Admit: 2012-12-05 | Discharge: 2012-12-05 | Disposition: A | Discharge status: Home / Self Care | Payer: Medicare Other | Source: Ambulatory Visit | Attending: Internal Medicine | Admitting: Internal Medicine

## 2012-12-05 ENCOUNTER — Encounter (HOSPITAL_COMMUNITY): Payer: Self-pay | Admitting: *Deleted

## 2012-12-05 VITALS — BP 124/76 | HR 120 | Wt 223.0 lb

## 2012-12-05 DIAGNOSIS — I5022 Chronic systolic (congestive) heart failure: Secondary | ICD-10-CM | POA: Insufficient documentation

## 2012-12-05 DIAGNOSIS — I4892 Unspecified atrial flutter: Secondary | ICD-10-CM

## 2012-12-05 DIAGNOSIS — I4891 Unspecified atrial fibrillation: Secondary | ICD-10-CM | POA: Insufficient documentation

## 2012-12-05 LAB — BASIC METABOLIC PANEL
Calcium: 9.4 mg/dL (ref 8.4–10.5)
GFR calc Af Amer: 71 mL/min — ABNORMAL LOW (ref >90)
GFR calc non Af Amer: 62 mL/min — ABNORMAL LOW (ref >90)
Glucose, Bld: 100 mg/dL — ABNORMAL HIGH (ref 70–99)
Potassium: 4.2 mEq/L (ref 3.5–5.1)
Sodium: 138 mEq/L (ref 135–145)

## 2012-12-05 LAB — CBC
Hemoglobin: 14 g/dL (ref 12.0–15.0)
MCH: 29.6 pg (ref 26.0–34.0)
Platelets: 279 10*3/uL (ref 150–400)
RBC: 4.73 MIL/uL (ref 3.87–5.11)

## 2012-12-05 LAB — PROTIME-INR
INR: 1.18 (ref 0.00–1.49)
Prothrombin Time: 14.8 seconds (ref 11.6–15.2)

## 2012-12-05 MED ORDER — RIVAROXABAN 20 MG PO TABS: 20.0000 mg | ORAL_TABLET | Freq: Every day | ORAL | Status: DC

## 2012-12-05 MED ORDER — AMIODARONE HCL 200 MG PO TABS: 400.0000 mg | ORAL_TABLET | Freq: Two times a day (BID) | ORAL | Status: DC

## 2012-12-05 NOTE — Patient Instructions (Addendum)
Take Amiodarone 400 mg bid  Continue Xarelto  Your physician recommends that you schedule a follow-up appointment in: 1 month

## 2012-12-05 NOTE — Assessment & Plan Note (Signed)
EKG revealed A flutter . 121 BPM. She continues on Xarelto 20 mg daily. Increase amiodarone to 400 mg twice a day. Plan for DC-CV 12/10/12. If she does not maintain SR will need to refer for A Flutter ablation. Follow up in 3 weeks.

## 2012-12-05 NOTE — Assessment & Plan Note (Addendum)
NYHA I. Volume stable. Continue current regimen. Will need cardiologist in [place] as she plans live there for the next 6 months.Check BMET.  Plan for DC-CV next week.

## 2012-12-05 NOTE — Progress Notes (Signed)
Patient ID: LAKARA WEILAND, female   DOB: [birth year], 71 y.o.   MRN: [id number] HPI: Ms Niederer has a PMH with A-Fib,  bilateral PEs, and hyperlipidemia.   D/C [facility] 10/28/12.  ECHO with EF 40-45% in setting of rapid AF. Feeling better. 10/27/12 TEE and DCCV completed. TEE EF 40% No clot. 10/28/12 Unsuccessful DCCV . Started on amiodarone drip and cardizem was stopped. HR 90-110 on tele. Discharged on amio 200 bid and toprol 100 daily. Ace held to reduce hypotension. Continued Xarelto.  11/06/12 S/P successful DC-CV  She returns for post DC-CV follow up. 11/12/12 Started on lasix 20 mg and 20 meq KDUR daily due to weight gain. Denies SOB/PND/Orthopnea/dizziness.  She is not weighing daily. Plans to travel to [place] 01/09/12 for the winter. Complaint with mediciatons. She has continued on Xarelto 20 mg daily.   ROS: All systems negative except as listed in HPI, PMH and Problem List.  Past Medical History  Diagnosis Date  . Hyperlipidemia   . Fasting hyperglycemia     A1C  within normal limits  . Diverticulosis 2002    Dr. Russella Dar  . Personal history of colonic polyps 2002    tubular adenoma  . Tortuous colon   . Vitamin D deficiency   . Pancreatitis due to common bile duct stone   . Other abnormal glucose 2011    102    Current Outpatient Prescriptions  Medication Sig Dispense Refill  . amiodarone (PACERONE) 200 MG tablet Take 1 tablet (200 mg total) by mouth 2 (two) times daily.  60 tablet  6  . furosemide (LASIX) 40 MG tablet Take 1 tablet (40 mg total) by mouth daily.  30 tablet  6  . potassium chloride SA (K-DUR,KLOR-CON) 20 MEQ tablet Take 1 tablet (20 mEq total) by mouth daily.  30 tablet  6  . Rivaroxaban (XARELTO) 20 MG TABS Take 1 tablet (20 mg total) by mouth daily.  30 tablet  6  . zolpidem (AMBIEN) 10 MG tablet 1 by mouth every 3rd night as needed  30 tablet  0  . [DISCONTINUED] Rivaroxaban (XARELTO) 20 MG TABS Take 1 tablet (20 mg total) by mouth daily.  30 tablet  0  .  [DISCONTINUED] Rivaroxaban (XARELTO) 15 MG TABS tablet Take 1 tablet (15 mg total) by mouth 2 (two) times daily.  21 tablet  0     PHYSICAL EXAM: Filed Vitals:   12/05/12 0909  BP: 124/76  Pulse: 120   Weight change:  General:  Well appearing. No resp difficulty (husband present) HEENT: normal Neck: supple. JVP flat. Carotids 2+ bilaterally; no bruits. No lymphadenopathy or thryomegaly appreciated. Cor: PMI normal. Tacycardia rate & rhythm. No rubs, gallops or murmurs. Lungs: clear Abdomen: soft, nontender, nondistended. No hepatosplenomegaly. No bruits or masses. Good bowel sounds. Extremities: no cyanosis, clubbing, rash, edema Neuro: alert & orientedx3, cranial nerves grossly intact. Moves all 4 extremities w/o difficulty. Affect pleasant.    ECG: A Flutter 121 BPM   ASSESSMENT & PLAN:

## 2012-12-06 ENCOUNTER — Other Ambulatory Visit (HOSPITAL_COMMUNITY): Payer: Self-pay | Admitting: Adult Health

## 2012-12-07 NOTE — Addendum Note (Signed)
Encounter addended by: Almedia Balls on: 12/07/2012 11:04 AM<BR>     Documentation filed: Charges VN

## 2012-12-10 ENCOUNTER — Encounter (HOSPITAL_COMMUNITY)
Admission: RE | Disposition: A | Discharge status: Home / Self Care | Payer: Self-pay | Source: Ambulatory Visit | Attending: Internal Medicine

## 2012-12-10 ENCOUNTER — Encounter (HOSPITAL_COMMUNITY): Payer: Self-pay

## 2012-12-10 ENCOUNTER — Encounter (HOSPITAL_COMMUNITY): Payer: Self-pay | Admitting: Anesthesiology

## 2012-12-10 ENCOUNTER — Ambulatory Visit (HOSPITAL_COMMUNITY): Payer: Medicare Other | Admitting: Anesthesiology

## 2012-12-10 ENCOUNTER — Ambulatory Visit (HOSPITAL_COMMUNITY)
Admission: RE | Admit: 2012-12-10 | Discharge: 2012-12-10 | Disposition: A | Discharge status: Home / Self Care | Payer: Medicare Other | Source: Ambulatory Visit | Attending: Internal Medicine | Admitting: Internal Medicine

## 2012-12-10 DIAGNOSIS — I4892 Unspecified atrial flutter: Secondary | ICD-10-CM | POA: Insufficient documentation

## 2012-12-10 DIAGNOSIS — E785 Hyperlipidemia, unspecified: Secondary | ICD-10-CM | POA: Insufficient documentation

## 2012-12-10 HISTORY — PX: CARDIOVERSION: SHX1299

## 2012-12-10 SURGERY — CARDIOVERSION
Anesthesia: Monitor Anesthesia Care

## 2012-12-10 MED ORDER — SODIUM CHLORIDE 0.9 % IV SOLN
INTRAVENOUS | Status: DC
  Administered 2012-12-10: 500 mL via INTRAVENOUS

## 2012-12-10 MED ORDER — SODIUM CHLORIDE 0.9 % IV SOLN
INTRAVENOUS | Status: DC | PRN
  Administered 2012-12-10: 14:00:00 via INTRAVENOUS

## 2012-12-10 NOTE — Anesthesia Preprocedure Evaluation (Addendum)
Anesthesia Evaluation  Patient identified by MRN, date of birth, ID band Patient awake    Reviewed: Allergy & Precautions, H&P , NPO status , Patient's Chart, lab work & pertinent test results  Airway Mallampati: II TM Distance: >3 FB     Dental  (+) Teeth Intact   Pulmonary shortness of breath and with exertion,  breath sounds clear to auscultation        Cardiovascular +CHF + dysrhythmias Atrial Fibrillation Rhythm:Regular Rate:Tachycardia     Neuro/Psych negative neurological ROS  negative psych ROS   GI/Hepatic negative GI ROS, Neg liver ROS,   Endo/Other  negative endocrine ROS  Renal/GU negative Renal ROS     Musculoskeletal negative musculoskeletal ROS (+)   Abdominal   Peds  Hematology negative hematology ROS (+)   Anesthesia Other Findings   Reproductive/Obstetrics                          Anesthesia Physical Anesthesia Plan  ASA: III  Anesthesia Plan: General   Post-op Pain Management:    Induction: Intravenous  Airway Management Planned: Mask  Additional Equipment:   Intra-op Plan:   Post-operative Plan:   Informed Consent:   Dental advisory given  Plan Discussed with: Anesthesiologist, Surgeon and CRNA  Anesthesia Plan Comments:        Anesthesia Quick Evaluation

## 2012-12-10 NOTE — Preoperative (Signed)
Beta Blockers   Reason not to administer Beta Blockers:Not Applicable 

## 2012-12-10 NOTE — Transfer of Care (Signed)
Immediate Anesthesia Transfer of Care Note  Patient: [name]  Procedure(s) Performed: Procedure(s) (LRB) with comments: CARDIOVERSION (N/A)  Patient Location: PACU  Anesthesia Type:General  Level of Consciousness: awake, alert  and oriented  Airway & Oxygen Therapy: Patient Spontanous Breathing and Patient connected to nasal cannula oxygen  Post-op Assessment: Report given to PACU RN and Post -op Vital signs reviewed and stable  Post vital signs: Reviewed and stable  Complications: No apparent anesthesia complications

## 2012-12-10 NOTE — Anesthesia Postprocedure Evaluation (Signed)
  Anesthesia Post-op Note  Patient: [name]  Procedure(s) Performed: Procedure(s) (LRB) with comments: CARDIOVERSION (N/A)  Patient Location: Endoscopy Unit  Anesthesia Type:General  Level of Consciousness: awake, alert  and oriented  Airway and Oxygen Therapy: Patient Spontanous Breathing and Patient connected to nasal cannula oxygen  Post-op Pain: mild  Post-op Assessment: Post-op Vital signs reviewed and Patient's Cardiovascular Status Stable  Post-op Vital Signs: stable  Complications: No apparent anesthesia complications

## 2012-12-10 NOTE — H&P (View-Only) (Signed)
Patient ID: [name], female   DOB: 06/23/1941, 71 y.o.   MRN: [id number] HPI: [name] has a PMH with A-Fib,  bilateral PEs, and hyperlipidemia.   D/C [facility] 10/28/12.  ECHO with EF 40-45% in setting of rapid AF. Feeling better. 10/27/12 TEE and DCCV completed. TEE EF 40% No clot. 10/28/12 Unsuccessful DCCV . Started on amiodarone drip and cardizem was stopped. HR 90-110 on tele. Discharged on amio 200 bid and toprol 100 daily. Ace held to reduce hypotension. Continued Xarelto.  11/06/12 S/P successful DC-CV  She returns for post DC-CV follow up. 11/12/12 Started on lasix 20 mg and 20 meq KDUR daily due to weight gain. Denies SOB/PND/Orthopnea/dizziness.  She is not weighing daily. Plans to travel to [place] 01/09/12 for the winter. Complaint with mediciatons. She has continued on Xarelto 20 mg daily.   ROS: All systems negative except as listed in HPI, PMH and Problem List.  Past Medical History  Diagnosis Date  . Hyperlipidemia   . Fasting hyperglycemia     A1C  within normal limits  . Diverticulosis 2002    Dr. Stark  . Personal history of colonic polyps 2002    tubular adenoma  . Tortuous colon   . Vitamin D deficiency   . Pancreatitis due to common bile duct stone   . Other abnormal glucose 2011    102    Current Outpatient Prescriptions  Medication Sig Dispense Refill  . amiodarone (PACERONE) 200 MG tablet Take 1 tablet (200 mg total) by mouth 2 (two) times daily.  60 tablet  6  . furosemide (LASIX) 40 MG tablet Take 1 tablet (40 mg total) by mouth daily.  30 tablet  6  . potassium chloride SA (K-DUR,KLOR-CON) 20 MEQ tablet Take 1 tablet (20 mEq total) by mouth daily.  30 tablet  6  . Rivaroxaban (XARELTO) 20 MG TABS Take 1 tablet (20 mg total) by mouth daily.  30 tablet  6  . zolpidem (AMBIEN) 10 MG tablet 1 by mouth every 3rd night as needed  30 tablet  0  . [DISCONTINUED] Rivaroxaban (XARELTO) 20 MG TABS Take 1 tablet (20 mg total) by mouth daily.  30 tablet  0  .  [DISCONTINUED] Rivaroxaban (XARELTO) 15 MG TABS tablet Take 1 tablet (15 mg total) by mouth 2 (two) times daily.  21 tablet  0     PHYSICAL EXAM: Filed Vitals:   12/05/12 0909  BP: 124/76  Pulse: 120   Weight change:  General:  Well appearing. No resp difficulty (husband present) HEENT: normal Neck: supple. JVP flat. Carotids 2+ bilaterally; no bruits. No lymphadenopathy or thryomegaly appreciated. Cor: PMI normal. Tacycardia rate & rhythm. No rubs, gallops or murmurs. Lungs: clear Abdomen: soft, nontender, nondistended. No hepatosplenomegaly. No bruits or masses. Good bowel sounds. Extremities: no cyanosis, clubbing, rash, edema Neuro: alert & orientedx3, cranial nerves grossly intact. Moves all 4 extremities w/o difficulty. Affect pleasant.    ECG: A Flutter 121 BPM   ASSESSMENT & PLAN:  

## 2012-12-10 NOTE — Interval H&P Note (Signed)
History and Physical Interval Note:  12/10/2012 2:04 PM  Leretha Dykes  has presented today for surgery, with the diagnosis of a-flutter  The various methods of treatment have been discussed with the patient and family. After consideration of risks, benefits and other options for treatment, the patient has consented to  Procedure(s) (LRB) with comments: CARDIOVERSION (N/A) as a surgical intervention .  The patient's history has been reviewed, patient examined, no change in status, stable for surgery.  I have reviewed the patient's chart and labs.  Questions were answered to the patient's satisfaction.     Daniel Bensimhon

## 2012-12-10 NOTE — CV Procedure (Signed)
     DIRECT CURRENT CARDIOVERSION  NAME:  [name]   MRN: [id number] DOB:  [birth year]   ADMIT DATE: 12/10/2012   INDICATIONS: Atrial flutter   PROCEDURE:   Informed consent was obtained prior to the procedure. The risks, benefits and alternatives for the procedure were discussed and the patient comprehended these risks. Once an appropriate time out was taken, the patient had the defibrillator pads placed in the anterior and posterior position. The patient then underwent sedation by the anesthesia service. Once an appropriate level of sedation was achieved, the patient received a single biphasic, synchronized 150J shock with prompt conversion to sinus rhythm. No apparent complications.   CONLCUSION:   1.  Successful DC-CV  Truman Hayward 2:13 PM

## 2012-12-11 ENCOUNTER — Encounter (HOSPITAL_COMMUNITY): Payer: Self-pay | Admitting: Internal Medicine

## 2012-12-20 ENCOUNTER — Ambulatory Visit (HOSPITAL_COMMUNITY)
Admission: RE | Admit: 2012-12-20 | Discharge: 2012-12-20 | Disposition: A | Discharge status: Home / Self Care | Payer: Medicare Other | Source: Ambulatory Visit | Attending: Internal Medicine | Admitting: Internal Medicine

## 2012-12-20 VITALS — BP 128/84 | HR 73 | Wt 220.0 lb

## 2012-12-20 DIAGNOSIS — I4892 Unspecified atrial flutter: Secondary | ICD-10-CM | POA: Insufficient documentation

## 2012-12-20 DIAGNOSIS — I5022 Chronic systolic (congestive) heart failure: Secondary | ICD-10-CM | POA: Insufficient documentation

## 2012-12-20 DIAGNOSIS — I2699 Other pulmonary embolism without acute cor pulmonale: Secondary | ICD-10-CM

## 2012-12-20 MED ORDER — AMIODARONE HCL 200 MG PO TABS: 200.0000 mg | ORAL_TABLET | Freq: Two times a day (BID) | ORAL | Status: DC

## 2012-12-20 NOTE — Assessment & Plan Note (Addendum)
NYHA I-II. Volume status stable.   Patient seen and examined with Tonye Becket, NP. We discussed all aspects of the encounter. I agree with the assessment and plan as stated above. Suspect HF and LV dysfunction due to rapid AF. No angina sx. Will repeat ECHO in April once she returns from [place]. If EF not improving will need ischemic eval. We will get her a cardiologist in [place].

## 2012-12-20 NOTE — Assessment & Plan Note (Addendum)
Repeat EKG in NSR. 64 bpm. Will cut back amiodarone to 200 mg twice a day. She will remain on Amiodarone for at least 6 months. Continue Xarelto 20 mg daily.   Patient seen and examined with Tonye Becket, NP. We discussed all aspects of the encounter. I agree with the assessment and plan as stated above. Maintaining SR. Cut back amio to 200 bid for 1-2 months then can cut back to 200 daily. Continue Xarelto.

## 2012-12-20 NOTE — Patient Instructions (Addendum)
Call for follow up once you return  Take Amiodarone 200 mg twice a day  Do the following things EVERYDAY: 1) Weigh yourself in the morning before breakfast. Write it down and keep it in a log. 2) Take your medicines as prescribed 3) Eat low salt foods-Limit salt (sodium) to 2000 mg per day.  4) Stay as active as you can everyday 5) Limit all fluids for the day to less than 2 liters

## 2012-12-20 NOTE — Progress Notes (Signed)
Patient ID: [name], female   DOB: [birth year], 71 y.o.   MRN: [id number]  HPI:  [name] is a 71 y/o woman who was admitted in 11/13 with acute bilateral PE, rapid AF and heart failure after a long plane trip. .EF 40-45%.   On 10/28/12 Unsuccessful DCCV . Loaded amio and underwent repeat DC-CV oo 11/13. Maintained on Xarelto. Ace held to reduce hypotension.   A few weeks ago presented with atrial flutter. Amio increased to 400 bid. On 12/10/12 successful DC-CV   She returns for post DC-CV follow up. Feels great. Denies SOB/PND/Orthopnea. Complaint with medications. Plans to travel [place] 01/09/12 and return in April 2014. No bleeding or HF.   ROS: All systems negative except as listed in HPI, PMH and Problem List.  Past Medical History  Diagnosis Date  . Hyperlipidemia   . Fasting hyperglycemia     A1C  within normal limits  . Diverticulosis 2002    Dr. Russella Dar  . Personal history of colonic polyps 2002    tubular adenoma  . Tortuous colon   . Vitamin D deficiency   . Pancreatitis due to common bile duct stone   . Other abnormal glucose 2011    102    Current Outpatient Prescriptions  Medication Sig Dispense Refill  . amiodarone (PACERONE) 200 MG tablet Take 2 tablets (400 mg total) by mouth 2 (two) times daily.  120 tablet  2  . furosemide (LASIX) 40 MG tablet Take 1 tablet (40 mg total) by mouth daily.  30 tablet  6  . potassium chloride SA (K-DUR,KLOR-CON) 20 MEQ tablet Take 1 tablet (20 mEq total) by mouth daily.  30 tablet  6  . zolpidem (AMBIEN) 10 MG tablet 1 by mouth every 3rd night as needed  30 tablet  0  . Rivaroxaban (XARELTO) 20 MG TABS Take 1 tablet (20 mg total) by mouth daily.  30 tablet  6     PHYSICAL EXAM: Filed Vitals:   12/20/12 1021  BP: 128/84  Pulse: 73   Weight change:  General:  Well appearing. No resp difficulty (husband present) HEENT: normal Neck: supple. JVP flat. Carotids 2+ bilaterally; no bruits. No lymphadenopathy or  thryomegaly appreciated. Cor: PMI normal. Regular rate & rhythm. No rubs, gallops or murmurs. Lungs: clear Abdomen: soft, nontender, nondistended. No hepatosplenomegaly. No bruits or masses. Good bowel sounds. Extremities: no cyanosis, clubbing, rash, edema Neuro: alert & orientedx3, cranial nerves grossly intact. Moves all 4 extremities w/o difficulty. Affect pleasant.    ECG:  NSR 64 BPM   ASSESSMENT & PLAN:

## 2012-12-21 NOTE — Assessment & Plan Note (Signed)
Continue Xarelto 

## 2012-12-25 DIAGNOSIS — I517 Cardiomegaly: Secondary | ICD-10-CM

## 2012-12-25 HISTORY — DX: Cardiomegaly: I51.7

## 2013-01-01 ENCOUNTER — Telehealth (HOSPITAL_COMMUNITY): Payer: Self-pay | Admitting: Cardiology

## 2013-01-01 ENCOUNTER — Telehealth: Payer: Self-pay | Admitting: Internal Medicine

## 2013-01-01 ENCOUNTER — Ambulatory Visit: Payer: Medicare Other | Admitting: Cardiovascular Disease

## 2013-01-01 MED ORDER — ZOSTER VACCINE LIVE 19400 UNT/0.65ML ~~LOC~~ SOLR: 0.6500 mL | Freq: Once | SUBCUTANEOUS | Status: DC

## 2013-01-01 NOTE — Telephone Encounter (Signed)
Pt states she would like Dr. Alwyn Ren to send rx for shingles shot to [facility] [address]. She has not checked with her insurance co about coverage, and states she is getting it regardless of cost.

## 2013-01-01 NOTE — Telephone Encounter (Signed)
RX sent, informed patient

## 2013-01-01 NOTE — Telephone Encounter (Signed)
Please fax Rx for compression hose to pts pharmacy [phone] Rx must have knee or thigh length and 15-20 or 20-30 or 30-45 mmhg   Thanks

## 2013-01-02 NOTE — Telephone Encounter (Signed)
Pt aware prescription for compression hose faxed

## 2013-01-07 ENCOUNTER — Other Ambulatory Visit: Payer: Self-pay | Admitting: Internal Medicine

## 2013-01-07 DIAGNOSIS — Z1231 Encounter for screening mammogram for malignant neoplasm of breast: Secondary | ICD-10-CM

## 2013-03-28 ENCOUNTER — Ambulatory Visit: Payer: Medicare Other

## 2013-03-31 ENCOUNTER — Ambulatory Visit
Admission: RE | Admit: 2013-03-31 | Discharge: 2013-03-31 | Disposition: A | Discharge status: Home / Self Care | Payer: Medicare Other | Source: Ambulatory Visit | Attending: Internal Medicine | Admitting: Internal Medicine

## 2013-03-31 DIAGNOSIS — Z1231 Encounter for screening mammogram for malignant neoplasm of breast: Secondary | ICD-10-CM

## 2013-04-22 ENCOUNTER — Other Ambulatory Visit: Payer: Self-pay

## 2013-04-22 ENCOUNTER — Ambulatory Visit (HOSPITAL_BASED_OUTPATIENT_CLINIC_OR_DEPARTMENT_OTHER)
Admission: RE | Admit: 2013-04-22 | Discharge: 2013-04-22 | Disposition: A | Discharge status: Home / Self Care | Payer: Medicare Other | Source: Ambulatory Visit | Attending: Internal Medicine | Admitting: Internal Medicine

## 2013-04-22 ENCOUNTER — Other Ambulatory Visit (HOSPITAL_COMMUNITY): Payer: Self-pay | Admitting: Adult Health

## 2013-04-22 ENCOUNTER — Ambulatory Visit (HOSPITAL_COMMUNITY)
Admission: RE | Admit: 2013-04-22 | Discharge: 2013-04-22 | Disposition: A | Discharge status: Home / Self Care | Payer: Medicare Other | Source: Ambulatory Visit | Attending: Internal Medicine | Admitting: Internal Medicine

## 2013-04-22 VITALS — BP 142/78 | HR 63 | Wt 205.0 lb

## 2013-04-22 DIAGNOSIS — I5022 Chronic systolic (congestive) heart failure: Secondary | ICD-10-CM

## 2013-04-22 DIAGNOSIS — I4891 Unspecified atrial fibrillation: Secondary | ICD-10-CM

## 2013-04-22 DIAGNOSIS — R0602 Shortness of breath: Secondary | ICD-10-CM

## 2013-04-22 DIAGNOSIS — Z860100 Personal history of colon polyps, unspecified: Secondary | ICD-10-CM | POA: Insufficient documentation

## 2013-04-22 DIAGNOSIS — E785 Hyperlipidemia, unspecified: Secondary | ICD-10-CM | POA: Insufficient documentation

## 2013-04-22 DIAGNOSIS — I509 Heart failure, unspecified: Secondary | ICD-10-CM | POA: Insufficient documentation

## 2013-04-22 DIAGNOSIS — Z79899 Other long term (current) drug therapy: Secondary | ICD-10-CM | POA: Insufficient documentation

## 2013-04-22 DIAGNOSIS — K5730 Diverticulosis of large intestine without perforation or abscess without bleeding: Secondary | ICD-10-CM | POA: Insufficient documentation

## 2013-04-22 DIAGNOSIS — Z86711 Personal history of pulmonary embolism: Secondary | ICD-10-CM | POA: Insufficient documentation

## 2013-04-22 DIAGNOSIS — Q438 Other specified congenital malformations of intestine: Secondary | ICD-10-CM | POA: Insufficient documentation

## 2013-04-22 DIAGNOSIS — I5041 Acute combined systolic (congestive) and diastolic (congestive) heart failure: Secondary | ICD-10-CM

## 2013-04-22 DIAGNOSIS — E559 Vitamin D deficiency, unspecified: Secondary | ICD-10-CM | POA: Insufficient documentation

## 2013-04-22 DIAGNOSIS — I369 Nonrheumatic tricuspid valve disorder, unspecified: Secondary | ICD-10-CM

## 2013-04-22 DIAGNOSIS — I2699 Other pulmonary embolism without acute cor pulmonale: Secondary | ICD-10-CM

## 2013-04-22 DIAGNOSIS — I4892 Unspecified atrial flutter: Secondary | ICD-10-CM

## 2013-04-22 MED ORDER — AMIODARONE HCL 200 MG PO TABS: 200.0000 mg | ORAL_TABLET | Freq: Every day | ORAL | Status: DC

## 2013-04-22 NOTE — Assessment & Plan Note (Addendum)
Remains in NSR. Cut back amiodarone to 200 mg daily. Continue Xarelto daily. Offered alternatives to amiodarone due to potential side effects.  Suggested stopping amiodarone versus flecanide or tikosyn.   Patient seen and examined with Tonye Becket, NP. We discussed all aspects of the encounter. I agree with the assessment and plan as stated above. Difficult situation. One could assume that AF was triggered by PE and thus self-limited however it was very hard to convert and she has multiple AF risk factors so I am assuming she will be high risks for re-occurrence even without a subsequent PE. We had a long talk with the potential side effects and my sentiment that I do not want her amio for a long time if we can avoid it. Will decrease amio to 200 daily. At next visit can consider stopping. Once amio off will need to decide whether we should just leaver her off AAs or switch to flecainide or Tikosyn. Given that her AF is often silent I have asked her to get a BP cuff that detects an irregular HR.

## 2013-04-22 NOTE — Progress Notes (Signed)
Patient ID: [name], female   DOB: 08/15/1941, 72 y.o.   MRN: [id number] HPI:  [name] is a 72 y/o woman who was admitted in 11/13 with acute bilateral PE, rapid AF and heart failure after a long plane trip. .EF 40-45%.  On 10/28/12 Unsuccessful DCCV . Loaded amio and underwent repeat DC-CV oo 11/13. Maintained on Xarelto. Ace held to reduce hypotension.   A few weeks ago presented with atrial flutter. Amio increased to 400 bid. On 12/10/12 successful DC-CV   04/22/13 ECHO EF 55-60%  She returns for follow up. She has returned from [place]. She had no difficulty. Feels great. Denies SOB/PND/Orthopnea. Complaint with medications. No bleeding or HF.      ROS: All systems negative except as listed in HPI, PMH and Problem List.  Past Medical History  Diagnosis Date  . Hyperlipidemia   . Fasting hyperglycemia     A1C  within normal limits  . Diverticulosis 2002    Dr. Russella Dar  . Personal history of colonic polyps 2002    tubular adenoma  . Tortuous colon   . Vitamin D deficiency   . Pancreatitis due to common bile duct stone   . Other abnormal glucose 2011    102    Current Outpatient Prescriptions  Medication Sig Dispense Refill  . amiodarone (PACERONE) 200 MG tablet Take 1 tablet (200 mg total) by mouth 2 (two) times daily.  60 tablet  2  . furosemide (LASIX) 40 MG tablet Take 1 tablet (40 mg total) by mouth daily.  30 tablet  6  . potassium chloride SA (K-DUR,KLOR-CON) 20 MEQ tablet Take 1 tablet (20 mEq total) by mouth daily.  30 tablet  6  . Rivaroxaban (XARELTO) 20 MG TABS Take 1 tablet (20 mg total) by mouth daily.  30 tablet  6  . zoster vaccine live, PF, (ZOSTAVAX) 84132 UNT/0.65ML injection Inject 19,400 Units into the skin once.  1 each  0  . zolpidem (AMBIEN) 10 MG tablet 1 by mouth every 3rd night as needed  30 tablet  0   No current facility-administered medications for this encounter.     PHYSICAL EXAM: Filed Vitals:   04/22/13 1514  BP: 142/78   Pulse: 63   Weight change:  General:  Well appearing. No resp difficulty (husband present) HEENT: normal Neck: supple. JVP flat. Carotids 2+ bilaterally; no bruits. No lymphadenopathy or thryomegaly appreciated. Cor: PMI normal. Regular rate & rhythm. No rubs, gallops or murmurs. Lungs: clear Abdomen: soft, nontender, nondistended. No hepatosplenomegaly. No bruits or masses. Good bowel sounds. Extremities: no cyanosis, clubbing, rash, edema Neuro: alert & orientedx3, cranial nerves grossly intact. Moves all 4 extremities w/o difficulty. Affect pleasant.    ECG:  NSR 61 BPM   ASSESSMENT & PLAN:

## 2013-04-22 NOTE — Progress Notes (Signed)
  Echocardiogram 2D Echocardiogram has been performed.  Nola Botkins 04/22/2013, 2:50 PM

## 2013-04-22 NOTE — Patient Instructions (Addendum)
Follow up in 3 months  Cut amiodarone back to 200 mg daily for 1 month, then decrease to 100 mg (1/2 tab) daily  Get a home BP cuff that detects irregular heart beats, call if it alerts you to an irregular beat

## 2013-04-22 NOTE — Assessment & Plan Note (Addendum)
Dr Gala Romney discussed and reviewed ECHO. EF recovery noted.   Attending: Echo reviewed personally in clinic and EF now normal once AF treated.

## 2013-04-23 NOTE — Assessment & Plan Note (Signed)
Attending: This was provoked in setting of international plane flight. Typically would treat for only 6 months with anti-coagulation but given AF will continue for now.

## 2013-04-23 NOTE — Addendum Note (Signed)
Encounter addended by: Simon Rhein, CCT on: 04/23/2013 11:44 AM<BR>     Documentation filed: Charges VN

## 2013-05-16 ENCOUNTER — Telehealth (HOSPITAL_COMMUNITY): Payer: Self-pay | Admitting: Physician Assistant

## 2013-05-16 MED ORDER — AMIODARONE HCL 200 MG PO TABS: 200.0000 mg | ORAL_TABLET | Freq: Every day | ORAL | Status: DC

## 2013-05-16 NOTE — Telephone Encounter (Signed)
Patient called requesting refill on amiodarone.  Refill sent to [facility].

## 2013-07-08 ENCOUNTER — Encounter: Payer: Self-pay | Admitting: Internal Medicine

## 2013-07-08 ENCOUNTER — Ambulatory Visit (INDEPENDENT_AMBULATORY_CARE_PROVIDER_SITE_OTHER): Payer: Medicare Other | Admitting: Internal Medicine

## 2013-07-08 VITALS — BP 126/80 | HR 61 | Temp 97.9°F | Resp 12 | Ht 66.03 in | Wt 210.0 lb

## 2013-07-08 DIAGNOSIS — E559 Vitamin D deficiency, unspecified: Secondary | ICD-10-CM

## 2013-07-08 DIAGNOSIS — Z0000 Encounter for general adult medical examination without abnormal findings: Secondary | ICD-10-CM

## 2013-07-08 DIAGNOSIS — E785 Hyperlipidemia, unspecified: Secondary | ICD-10-CM

## 2013-07-08 LAB — HEPATIC FUNCTION PANEL
ALT: 17 U/L (ref 0–35)
AST: 20 U/L (ref 0–37)
Albumin: 4.2 g/dL (ref 3.5–5.2)
Alkaline Phosphatase: 55 U/L (ref 39–117)

## 2013-07-08 LAB — BASIC METABOLIC PANEL
CO2: 24 mEq/L (ref 19–32)
Chloride: 106 mEq/L (ref 96–112)
GFR: 55.81 mL/min — ABNORMAL LOW (ref >60.00)
Glucose, Bld: 78 mg/dL (ref 70–99)
Potassium: 4.2 mEq/L (ref 3.5–5.1)
Sodium: 138 mEq/L (ref 135–145)

## 2013-07-08 LAB — LIPID PANEL: VLDL: 9.2 mg/dL (ref 0.0–40.0)

## 2013-07-08 MED ORDER — ZOLPIDEM TARTRATE 10 MG PO TABS: ORAL_TABLET | ORAL | Status: DC

## 2013-07-08 NOTE — Patient Instructions (Addendum)
If you activate the  My Chart system; lab & Xray results will be released directly  to you as soon as I review & address these through the computer. If you choose not to sign up for My Chart within 36 hours of labs being drawn; results will be reviewed & interpretation added before being copied & mailed, causing a delay in getting the results to you.If you do not receive that report within 7-10 days ,please call. Additionally you can use this system to gain direct  access to your records  if  out of town or @ an office of a  physician who is not in  the My Chart network.  This improves continuity of care & places you in control of your medical record.  Share results with all non [place] medical staff seen  

## 2013-07-08 NOTE — Addendum Note (Signed)
Addended by: Maurice Small on: 07/08/2013 09:29 AM   Modules accepted: Orders

## 2013-07-08 NOTE — Progress Notes (Signed)
Subjective:    Patient ID: [name], female    DOB: [birth year], 72 y.o.   MRN: [id number]  HPI Medicare Wellness Visit:  Psychosocial & medical history were reviewed as required by Medicare (abuse,antisocial behavioral risks,firearm risk).  Social history: caffeine: no  , alcohol:  < 2 / week  ,  tobacco use:   never Exercise :  See below Home & personal  safety / fall risk:no Limitations of activities of daily living:no Seatbelt  and smoke alarm use:yes Power of Attorney/Living Will status : in place Ophthalmology exam status :current Hearing evaluation status:not current Orientation :oriented X 3 Memory & recall : good Spelling  testing:good Active depression / anxiety:denied Foreign travel history : [place] 2013 Immunization status for Shingles /Flu/ PNA/ tetanus : current Transfusion history: no  Preventive health surveillance status of colonoscopy, BMD , mammograms,PAP as per protocol/ SOC: current Dental care: every 6 mos  Chart reviewed &  Updated. Active issues reviewed & addressed.      Review of Systems She is not on a heart healthy diet; she exercises as walking 1.5 mpd & water aerobics 5 times per week without symptoms. Specifically she denies chest pain, palpitations, dyspnea, or claudication. Family history is negative for premature coronary disease in he mother. Advanced cholesterol testing reveals her LDL goal is less than 130. No need for statin to date     Objective:   Physical Exam Gen.: Healthy and well-nourished in appearance. Alert, appropriate and cooperative throughout exam. Appears younger than stated age  Head: Normocephalic without obvious abnormalities Eyes: No corneal or conjunctival inflammation noted.  Extraocular motion intact. Vision grossly normal with lenses Ears: External  ear exam reveals no significant lesions or deformities. Canals clear .TMs normal. Hearing is grossly slightly decreased bilaterally. Nose: External nasal exam  reveals no deformity or inflammation. Nasal mucosa are pink and moist. No lesions or exudates noted.   Mouth: Oral mucosa and oropharynx reveal no lesions or exudates. Teeth in good repair. Neck: No deformities, masses, or tenderness noted. Range of motion normal. Thyroid physiologic asymmetry; R > L. Lungs: Normal respiratory effort; chest expands symmetrically. Lungs are clear to auscultation without rales, wheezes, or increased work of breathing. Heart: Normal rate and rhythm. Normal S1 and S2. No gallop, click, or rub. S4 w/o  murmur. Abdomen: Bowel sounds normal; abdomen soft and nontender. No masses, organomegaly or hernias noted. Genitalia: As per Gyn                                  Musculoskeletal/extremities: No deformity or scoliosis noted of  the thoracic or lumbar spine.  No clubbing, cyanosis, edema, or significant extremity  deformity noted. Range of motion decreased @ knees .Tone & strength  Normal. Joints  reveal minor  DJD DIP changes. Nail health good. Able to lie down & sit up w/o help. Negative SLR bilaterally Vascular: Carotid, radial artery, dorsalis pedis and  posterior tibial pulses are full and equal. No bruits present. Neurologic: Alert and oriented x3. Deep tendon reflexes symmetrical but 0+@ knees    Skin: Intact without suspicious lesions or rashes. Lymph: No cervical, axillary lymphadenopathy present. Psych: Mood and affect are normal. Normally interactive  Assessment & Plan:  #1 Medicare Wellness Exam; criteria met ; data entered #2 Problem List/Diagnoses reviewed Plan:  Assessments made/ Orders entered  

## 2013-07-09 ENCOUNTER — Other Ambulatory Visit (HOSPITAL_COMMUNITY): Payer: Self-pay | Admitting: Internal Medicine

## 2013-07-12 LAB — VITAMIN D 1,25 DIHYDROXY
Vitamin D 1, 25 (OH)2 Total: 39 pg/mL (ref 18–72)
Vitamin D2 1, 25 (OH)2: <8 pg/mL
Vitamin D3 1, 25 (OH)2: 39 pg/mL

## 2013-09-01 ENCOUNTER — Ambulatory Visit (HOSPITAL_COMMUNITY)
Admission: RE | Admit: 2013-09-01 | Discharge: 2013-09-01 | Disposition: A | Discharge status: Home / Self Care | Payer: Medicare Other | Source: Ambulatory Visit | Attending: Internal Medicine | Admitting: Internal Medicine

## 2013-09-01 ENCOUNTER — Encounter (HOSPITAL_COMMUNITY): Payer: Self-pay

## 2013-09-01 VITALS — BP 120/74 | HR 66 | Wt 210.1 lb

## 2013-09-01 DIAGNOSIS — I2699 Other pulmonary embolism without acute cor pulmonale: Secondary | ICD-10-CM | POA: Insufficient documentation

## 2013-09-01 DIAGNOSIS — E039 Hypothyroidism, unspecified: Secondary | ICD-10-CM | POA: Insufficient documentation

## 2013-09-01 DIAGNOSIS — I4891 Unspecified atrial fibrillation: Secondary | ICD-10-CM | POA: Insufficient documentation

## 2013-09-01 MED ORDER — METOPROLOL TARTRATE 25 MG PO TABS: 25.0000 mg | ORAL_TABLET | Freq: Every day | ORAL | Status: DC

## 2013-09-01 NOTE — Patient Instructions (Addendum)
Start lopressor 25 mg daily.  Stop Amiodarone  Get labs at [facility] next Monday at [facility].   Follow up 6 months.

## 2013-09-01 NOTE — Progress Notes (Signed)
Patient ID: [name], female   DOB: July 09, 1941, 72 y.o.   MRN: [id number] HPI:  [name] is a 72 y/o woman who was admitted in 11/13 with acute bilateral PE, rapid AF and heart failure after a long plane trip. .EF 40-45%.  On 10/28/12 Unsuccessful DCCV . Loaded amio and underwent repeat DC-CV oo 11/13. Maintained on Xarelto. Ace held to reduce hypotension.   A few weeks ago presented with atrial flutter. Amio increased to 400 bid. On 12/10/12 successful DC-CV   04/22/13 ECHO EF 55-60%  Follow up: Last visit decreased amiodarone to 200 mg daily. Feeling great and had a great trip to [name] earlier this year. Takes BP 2 times a week along with pulse and reports that it is stable. Denies SOB/PND/Orthopnea. Complaint with medications. No bleeding or HF. TSH from 06/2013 elevated 47 she has not received call from PCP. Denies fatigue, palpitations, heat/cold intolerances, or change in weight.    ROS: All systems negative except as listed in HPI, PMH and Problem List.  Past Medical History  Diagnosis Date  . Hyperlipidemia   . Fasting hyperglycemia     A1C  within normal limits  . Diverticulosis 2002    Dr. Russella Dar  . Personal history of colonic polyps 2002    tubular adenoma  . Tortuous colon   . Vitamin D deficiency   . Pancreatitis due to common bile duct stone   . Other abnormal glucose 2011    102    Current Outpatient Prescriptions  Medication Sig Dispense Refill  . amiodarone (PACERONE) 200 MG tablet Take 1 tablet (200 mg total) by mouth daily.  30 tablet  2  . XARELTO 20 MG TABS TAKE 1 TABLET ONCE DAILY.  60 tablet  2  . zolpidem (AMBIEN) 10 MG tablet 1 by mouth every 3rd night as needed  30 tablet  0   No current facility-administered medications for this encounter.   Filed Vitals:   09/01/13 1209  BP: 120/74  Pulse: 66  Weight: 210 lb 1.9 oz (95.31 kg)  SpO2: 98%   PHYSICAL EXAM: General:  Well appearing. No resp difficulty (husband present) HEENT: normal Neck:  supple. JVP flat. Carotids 2+ bilaterally; no bruits. No lymphadenopathy or thryomegaly appreciated. Cor: PMI normal. Regular rate & rhythm. No rubs, gallops or murmurs. Lungs: clear Abdomen: soft, nontender, nondistended. No hepatosplenomegaly. No bruits or masses. Good bowel sounds. Extremities: no cyanosis, clubbing, rash, edema Neuro: alert & orientedx3, cranial nerves grossly intact. Moves all 4 extremities w/o difficulty. Affect pleasant.   ASSESSMENT & PLAN:  1) Hx of A-fib: Paroxysmal.  - Has remained in NSR since cardioversion 2013. Will stop amiodarone today and start low dose BB, Toprol XL 25 mg daily.  2) Hx of bilateral PEs - Will continue xarelto with history of large PE, paroxysmal atrial fibrillation, and frequent long plane trips.   3) Elevated TSH - Will forward to Dr. Alwyn Ren. Stopped Amiodarone today. Will get TSH, free T3 and free T4 on Monday.  F/U 6 months  Ulla Potash B NP-C 12:59 PM  Patient seen with NP, agree with the above note.   Doing well, no complaints.    Had large PE, flies long-distances frequently, and has paroxysmal atrial fibrillation: favor long-term Xarelto.   Stop amiodarone today and start Toprol XL 25 mg daily.    Noted TSH very high in 7/14.  May be related to amiodarone use.  Will repeat TSH, free T3, and free T4 next Monday after  she has been off amiodarone for about a week.    Marca Ancona 09/01/2013

## 2013-09-15 ENCOUNTER — Other Ambulatory Visit: Payer: Medicare Other

## 2013-09-15 DIAGNOSIS — I4891 Unspecified atrial fibrillation: Secondary | ICD-10-CM

## 2013-09-15 LAB — TSH: TSH: 52.89 u[IU]/mL — ABNORMAL HIGH (ref 0.35–5.50)

## 2013-09-19 ENCOUNTER — Other Ambulatory Visit: Payer: Self-pay | Admitting: Internal Medicine

## 2013-09-19 ENCOUNTER — Telehealth: Payer: Self-pay | Admitting: *Deleted

## 2013-09-19 DIAGNOSIS — E039 Hypothyroidism, unspecified: Secondary | ICD-10-CM

## 2013-09-19 MED ORDER — LEVOTHYROXINE SODIUM 50 MCG PO TABS: ORAL_TABLET | ORAL | Status: DC

## 2013-09-19 NOTE — Telephone Encounter (Signed)
Called and spoke with patient to let her know that her prescription for Levothyroxine was faxed to [facility].

## 2013-10-30 ENCOUNTER — Other Ambulatory Visit: Payer: Self-pay

## 2013-12-08 ENCOUNTER — Telehealth: Payer: Self-pay | Admitting: Internal Medicine

## 2013-12-08 DIAGNOSIS — E039 Hypothyroidism, unspecified: Secondary | ICD-10-CM

## 2013-12-08 NOTE — Telephone Encounter (Signed)
Patient states that she is almost finished with her Synthroid rx and was told to go and have her thyroid checked when she was finished to see if the rx was working. Patient wants to have her labs drawn tomorrow at [facility]. Please advise on orders.

## 2013-12-08 NOTE — Telephone Encounter (Signed)
Future ordered entered and sent.//AB/CMA

## 2013-12-11 ENCOUNTER — Other Ambulatory Visit (INDEPENDENT_AMBULATORY_CARE_PROVIDER_SITE_OTHER): Payer: Medicare Other

## 2013-12-11 DIAGNOSIS — E039 Hypothyroidism, unspecified: Secondary | ICD-10-CM

## 2013-12-11 LAB — TSH: TSH: 25.46 u[IU]/mL — ABNORMAL HIGH (ref 0.35–5.50)

## 2014-01-09 ENCOUNTER — Other Ambulatory Visit (HOSPITAL_COMMUNITY): Payer: Self-pay | Admitting: Anesthesiology

## 2014-01-09 ENCOUNTER — Other Ambulatory Visit (HOSPITAL_COMMUNITY): Payer: Self-pay | Admitting: Internal Medicine

## 2014-01-09 ENCOUNTER — Other Ambulatory Visit: Payer: Self-pay | Admitting: Internal Medicine

## 2014-01-12 NOTE — Telephone Encounter (Signed)
Levothyroxine refilled per protocol. JG//CMA 

## 2014-02-27 ENCOUNTER — Other Ambulatory Visit: Payer: Self-pay

## 2014-02-27 DIAGNOSIS — Z1231 Encounter for screening mammogram for malignant neoplasm of breast: Secondary | ICD-10-CM

## 2014-03-18 ENCOUNTER — Telehealth (HOSPITAL_COMMUNITY): Payer: Self-pay

## 2014-03-18 NOTE — Telephone Encounter (Signed)
Left message for patient to call back in order to scheduled follow up appointment due with the [facility].

## 2014-04-02 ENCOUNTER — Ambulatory Visit
Admission: RE | Admit: 2014-04-02 | Discharge: 2014-04-02 | Disposition: A | Discharge status: Home / Self Care | Payer: Medicare Other | Source: Ambulatory Visit

## 2014-04-02 DIAGNOSIS — Z1231 Encounter for screening mammogram for malignant neoplasm of breast: Secondary | ICD-10-CM

## 2014-07-09 ENCOUNTER — Ambulatory Visit (INDEPENDENT_AMBULATORY_CARE_PROVIDER_SITE_OTHER): Payer: Medicare Other | Admitting: Internal Medicine

## 2014-07-09 ENCOUNTER — Encounter: Payer: Self-pay | Admitting: Internal Medicine

## 2014-07-09 ENCOUNTER — Other Ambulatory Visit (INDEPENDENT_AMBULATORY_CARE_PROVIDER_SITE_OTHER): Payer: Medicare Other

## 2014-07-09 VITALS — BP 130/80 | HR 71 | Temp 98.4°F | Ht 67.0 in | Wt 239.0 lb

## 2014-07-09 DIAGNOSIS — E559 Vitamin D deficiency, unspecified: Secondary | ICD-10-CM

## 2014-07-09 DIAGNOSIS — Z860100 Personal history of colon polyps, unspecified: Secondary | ICD-10-CM

## 2014-07-09 DIAGNOSIS — I2699 Other pulmonary embolism without acute cor pulmonale: Secondary | ICD-10-CM

## 2014-07-09 DIAGNOSIS — E038 Other specified hypothyroidism: Secondary | ICD-10-CM

## 2014-07-09 DIAGNOSIS — E785 Hyperlipidemia, unspecified: Secondary | ICD-10-CM

## 2014-07-09 DIAGNOSIS — Z860101 Personal history of adenomatous and serrated colon polyps: Secondary | ICD-10-CM

## 2014-07-09 DIAGNOSIS — Z0000 Encounter for general adult medical examination without abnormal findings: Secondary | ICD-10-CM

## 2014-07-09 DIAGNOSIS — E669 Obesity, unspecified: Secondary | ICD-10-CM | POA: Insufficient documentation

## 2014-07-09 DIAGNOSIS — G479 Sleep disorder, unspecified: Secondary | ICD-10-CM

## 2014-07-09 LAB — CBC WITH DIFFERENTIAL/PLATELET
BASOS PCT: 0.4 % (ref 0.0–3.0)
Basophils Absolute: 0 10*3/uL (ref 0.0–0.1)
EOS PCT: 2 % (ref 0.0–5.0)
Eosinophils Absolute: 0.1 10*3/uL (ref 0.0–0.7)
HEMATOCRIT: 39.7 % (ref 36.0–46.0)
HEMOGLOBIN: 13.3 g/dL (ref 12.0–15.0)
LYMPHS ABS: 1.1 10*3/uL (ref 0.7–4.0)
Lymphocytes Relative: 16.5 % (ref 12.0–46.0)
MCHC: 33.4 g/dL (ref 30.0–36.0)
MCV: 91 fl (ref 78.0–100.0)
MONO ABS: 0.4 10*3/uL (ref 0.1–1.0)
Monocytes Relative: 6.1 % (ref 3.0–12.0)
NEUTROS ABS: 5.1 10*3/uL (ref 1.4–7.7)
Neutrophils Relative %: 75 % (ref 43.0–77.0)
PLATELETS: 283 10*3/uL (ref 150.0–400.0)
RBC: 4.36 Mil/uL (ref 3.87–5.11)
RDW: 13.3 % (ref 11.5–15.5)
WBC: 6.8 10*3/uL (ref 4.0–10.5)

## 2014-07-09 LAB — BASIC METABOLIC PANEL
BUN: 16 mg/dL (ref 6–23)
CHLORIDE: 105 meq/L (ref 96–112)
CO2: 27 mEq/L (ref 19–32)
Calcium: 9.4 mg/dL (ref 8.4–10.5)
Creatinine, Ser: 0.8 mg/dL (ref 0.4–1.2)
GFR: 76.72 mL/min (ref >60.00)
Glucose, Bld: 95 mg/dL (ref 70–99)
POTASSIUM: 4.2 meq/L (ref 3.5–5.1)
SODIUM: 139 meq/L (ref 135–145)

## 2014-07-09 LAB — TSH: TSH: 4.09 u[IU]/mL (ref 0.35–4.50)

## 2014-07-09 LAB — LIPID PANEL
Cholesterol: 227 mg/dL — ABNORMAL HIGH (ref 0–200)
HDL: 57.6 mg/dL (ref >39.00)
LDL Cholesterol: 151 mg/dL — ABNORMAL HIGH (ref 0–99)
NonHDL: 169.4
TRIGLYCERIDES: 91 mg/dL (ref 0.0–149.0)
Total CHOL/HDL Ratio: 4
VLDL: 18.2 mg/dL (ref 0.0–40.0)

## 2014-07-09 LAB — HEPATIC FUNCTION PANEL
ALBUMIN: 4.1 g/dL (ref 3.5–5.2)
ALT: 14 U/L (ref 0–35)
AST: 18 U/L (ref 0–37)
Alkaline Phosphatase: 63 U/L (ref 39–117)
Bilirubin, Direct: 0.1 mg/dL (ref 0.0–0.3)
TOTAL PROTEIN: 7.3 g/dL (ref 6.0–8.3)
Total Bilirubin: 0.8 mg/dL (ref 0.2–1.2)

## 2014-07-09 NOTE — Assessment & Plan Note (Signed)
TSH 

## 2014-07-09 NOTE — Assessment & Plan Note (Signed)
Lipids, LFTs, TSH  

## 2014-07-09 NOTE — Progress Notes (Signed)
Pre visit review using our clinic review tool, if applicable. No additional management support is needed unless otherwise documented below in the visit note. 

## 2014-07-09 NOTE — Assessment & Plan Note (Signed)
CBC

## 2014-07-09 NOTE — Assessment & Plan Note (Signed)
Vitamin D level 

## 2014-07-09 NOTE — Progress Notes (Signed)
Subjective:    Patient ID: [name], female    DOB: Jun 12, 1941, 73 y.o.   MRN: [id number]  HPI UHC/Medicare Wellness Visit: Psychosocial and medical history were reviewed as required by Medicare (history related to abuse, antisocial behavior , firearm risk). Social history: Caffeine:none , Alcohol:< 1 /week  , Tobacco MHD:QQIWL Exercise:water aerobics & treadmill 4-5 X/ week for 30-60 min Personal safety/fall risk:no Limitations of activities of daily living:no Seatbelt/ smoke alarm use:yes Healthcare Power of Attorney/Living Will status: UTD Ophthalmologic exam status:UTD Hearing evaluation status: not UTD Orientation: Oriented X 3 Memory and recall: good Spelling testing: good Depression/anxiety assessment: no Foreign travel history:last 1/15 [place] Immunization status for influenza/pneumonia/ shingles /tetanus: UTD Transfusion history: no Preventive health care maintenance status: Colonoscopy/BMD/mammogram/Pap as per protocol/standard care: UTD Dental care:evdery 6 mos Chart reviewed and updated. Active issues reviewed and addressed as documented below.  She never reviewed the abnormal TSH from December 2014 and did not return as recommended to address the hypothyroidism.    Review of Systems   Constitutional: Significant change in weight of 20# since 12/2013.Some fatigue & decreased energy. Chronic sleep disorder which has not been evaluated.She sleeps only 2-3 hours per night. Ambien did not help. Eye: no blurred, double vision ,loss of vision Cardiovascular: no palpitations; racing; irregularity ENT/GI: no constipation; diarrhea;hoarseness;dysphagia Derm: no change in nails,hair,skin Neuro: no numbness or tingling; tremor Psych:no anxiety; depression; panic attacks Endo: no temperature intolerance to heat ,cold       Objective:   Physical Exam Gen.: Healthy and well-nourished in appearance. Alert, appropriate and cooperative throughout exam. Appears younger  than stated age  Head: Normocephalic without obvious abnormalities; no alopecia  Eyes: No corneal or conjunctival inflammation noted. Pupils equal round reactive to light and accommodation. Extraocular motion intact.  Ears: External  ear exam reveals no significant lesions or deformities. Canals clear .TMs normal. Hearing is grossly slightly decreased bilaterally. Nose: External nasal exam reveals no deformity or inflammation. Nasal mucosa are pink and moist. No lesions or exudates noted.   Mouth: Oral mucosa and oropharynx reveal no lesions or exudates. Teeth in good repair. Neck: No deformities, masses, or tenderness noted. Range of motion & Thyroid normal. Lungs: Normal respiratory effort; chest expands symmetrically. Lungs are clear to auscultation without rales, wheezes, or increased work of breathing. Heart: Normal rate and rhythm. Normal S1 and S2. No gallop, click, or rub. No murmur. Abdomen: Bowel sounds normal; abdomen soft and nontender. No masses, organomegaly or hernias noted. Genitalia: as per Gyn                                  Musculoskeletal/extremities: No deformity or scoliosis noted of  the thoracic or lumbar spine.  No clubbing, cyanosis, edema, or significant extremity  deformity noted. Range of motion decreased R knee.Crepitus of knees.Fusiform changesTone & strength normal. Hand joints normal  Fingernail / toenail health good. Able to lie down & sit up w/o help. Negative SLR bilaterally Vascular: Carotid, radial artery, dorsalis pedis and  posterior tibial pulses are full and equal. No bruits present. Neurologic: Alert and oriented x3. Deep tendon reflexes asymmetric;decreased @ R knee. Gait normal.       Skin: Intact without suspicious lesions or rashes. Lymph: No cervical, axillary lymphadenopathy present. Psych: Mood and affect are normal. Normally interactive  Assessment &  Plan:  See Current Assessment & Plan in Problem List under specific DiagnosisThe labs will be reviewed and risks and options assessed. WRITTEN recommendations will be provided by mail or directly through My Chart.Further evaluation or change in medical therapy will be directed by those results.

## 2014-07-09 NOTE — Patient Instructions (Signed)
Your next office appointment will be determined based upon review of your pending labs. Those instructions will be transmitted to you  by mail.    I recommend a Neurology/Sleep Medicine consultation to determine optimal therapy; please inform me if I may schedule that.

## 2014-07-09 NOTE — Assessment & Plan Note (Signed)
  Potential cardiopulmonary risks discussed Referral to Sleep Specialist recommended

## 2014-07-13 LAB — VITAMIN D 1,25 DIHYDROXY
VITAMIN D3 1, 25 (OH): 41 pg/mL
Vitamin D 1, 25 (OH)2 Total: 41 pg/mL (ref 18–72)
Vitamin D2 1, 25 (OH)2: <8 pg/mL

## 2014-07-21 ENCOUNTER — Telehealth: Payer: Self-pay | Admitting: Internal Medicine

## 2014-07-21 NOTE — Telephone Encounter (Signed)
Pt request result of lab work that was done 07/09/14. Pt stated she didn't get anything in the mail. Please call pt

## 2014-07-21 NOTE — Telephone Encounter (Signed)
Phone call to patient and advised I will mailed results. She was given the phone number to [facility] support. I gave her an explanation of the results.

## 2014-07-29 ENCOUNTER — Telehealth: Payer: Self-pay

## 2014-07-29 NOTE — Telephone Encounter (Signed)
Patient called stating she has not received her lab results in the mail. I mailed labs again to patient.

## 2014-08-09 ENCOUNTER — Other Ambulatory Visit (HOSPITAL_COMMUNITY): Payer: Self-pay | Admitting: Internal Medicine

## 2014-09-08 ENCOUNTER — Telehealth (HOSPITAL_COMMUNITY): Payer: Self-pay | Admitting: Vascular Surgery

## 2014-09-08 NOTE — Telephone Encounter (Signed)
Spoke w/pt, she was last seen last Sept, she denies CP or SOB but states she just has no energy, even just going up stairs she has to stop and rest, appt sch for tomorrow at 10:20

## 2014-09-08 NOTE — Telephone Encounter (Signed)
Pt called havent seen [name] in 2 years she doesn't feel well.. Tired and weak.. Pt wants to see [name] ASAP.Marland Kitchen PLEASE ADVISE

## 2014-09-09 ENCOUNTER — Encounter (HOSPITAL_COMMUNITY): Payer: Self-pay

## 2014-09-09 ENCOUNTER — Ambulatory Visit (HOSPITAL_COMMUNITY)
Admission: RE | Admit: 2014-09-09 | Discharge: 2014-09-09 | Disposition: A | Discharge status: Home / Self Care | Payer: Medicare Other | Source: Ambulatory Visit | Attending: Internal Medicine | Admitting: Internal Medicine

## 2014-09-09 ENCOUNTER — Encounter (HOSPITAL_COMMUNITY): Payer: Self-pay | Admitting: *Deleted

## 2014-09-09 VITALS — BP 98/56 | HR 127 | Wt 241.8 lb

## 2014-09-09 DIAGNOSIS — Z860100 Personal history of colon polyps, unspecified: Secondary | ICD-10-CM | POA: Insufficient documentation

## 2014-09-09 DIAGNOSIS — Z79899 Other long term (current) drug therapy: Secondary | ICD-10-CM | POA: Diagnosis not present

## 2014-09-09 DIAGNOSIS — E785 Hyperlipidemia, unspecified: Secondary | ICD-10-CM | POA: Diagnosis not present

## 2014-09-09 DIAGNOSIS — K8050 Calculus of bile duct without cholangitis or cholecystitis without obstruction: Secondary | ICD-10-CM | POA: Diagnosis not present

## 2014-09-09 DIAGNOSIS — K861 Other chronic pancreatitis: Secondary | ICD-10-CM | POA: Insufficient documentation

## 2014-09-09 DIAGNOSIS — Z7901 Long term (current) use of anticoagulants: Secondary | ICD-10-CM | POA: Insufficient documentation

## 2014-09-09 DIAGNOSIS — R7309 Other abnormal glucose: Secondary | ICD-10-CM | POA: Diagnosis not present

## 2014-09-09 DIAGNOSIS — R0609 Other forms of dyspnea: Secondary | ICD-10-CM | POA: Insufficient documentation

## 2014-09-09 DIAGNOSIS — R0989 Other specified symptoms and signs involving the circulatory and respiratory systems: Secondary | ICD-10-CM | POA: Insufficient documentation

## 2014-09-09 DIAGNOSIS — E559 Vitamin D deficiency, unspecified: Secondary | ICD-10-CM | POA: Insufficient documentation

## 2014-09-09 DIAGNOSIS — I4891 Unspecified atrial fibrillation: Secondary | ICD-10-CM

## 2014-09-09 DIAGNOSIS — K5730 Diverticulosis of large intestine without perforation or abscess without bleeding: Secondary | ICD-10-CM | POA: Diagnosis not present

## 2014-09-09 DIAGNOSIS — E039 Hypothyroidism, unspecified: Secondary | ICD-10-CM | POA: Diagnosis not present

## 2014-09-09 MED ORDER — AMIODARONE HCL 200 MG PO TABS: 400.0000 mg | ORAL_TABLET | Freq: Two times a day (BID) | ORAL | Status: DC

## 2014-09-09 NOTE — Progress Notes (Signed)
Patient ID: [name], female   DOB: September 11, 1941, 73 y.o.   MRN: [id number] HPI:  [name] is a 73 y/o woman who was admitted in 11/13 with acute bilateral PE, rapid AF and heart failure after a long plane trip. .EF 40-45%.  On 10/28/12 Unsuccessful DCCV . Loaded amio and underwent repeat DC-CV on 11/13. Maintained on Xarelto. Ace held to reduce hypotension.   In 11/13 had recurrent AFL.  Amio increased to 400 bid. On 12/10/12 successful DC-CV   04/22/13 ECHO EF 55-60%  Follow up: Amiodarone stopped stopped 9/14. Returns today for f/u. Has felt run down for about a month. +DOE. No palpitations. No edema. But has gained weight - 30 pounds from last September. Sleeps poorly. Has been compliant with Xarelto without bleeding.    ROS: All systems negative except as listed in HPI, PMH and Problem List.  Past Medical History  Diagnosis Date  . Hyperlipidemia   . Fasting hyperglycemia     A1C  within normal limits  . Diverticulosis 2002    Dr. Fuller Plan  . Personal history of colonic polyps 2002    tubular adenoma  . Tortuous colon   . Vitamin D deficiency   . Pancreatitis due to common bile duct stone   . Other abnormal glucose 2011    102    Current Outpatient Prescriptions  Medication Sig Dispense Refill  . levothyroxine (SYNTHROID, LEVOTHROID) 50 MCG tablet Take 1/2 tab by mouth daily  45 tablet  1  . metoprolol tartrate (LOPRESSOR) 25 MG tablet TAKE 1 TABLET DAILY.  90 tablet  3  . XARELTO 20 MG TABS tablet TAKE 1 TABLET ONCE DAILY.  60 tablet  6   No current facility-administered medications for this encounter.   Filed Vitals:   09/09/14 1032  BP: 98/56  Pulse: 127  Weight: 241 lb 12.8 oz (109.68 kg)  SpO2: 96%   PHYSICAL EXAM: General:  Fatigued appearing. No resp difficulty (husband present) HEENT: normal Neck: supple. JVP flat. Carotids 2+ bilaterally; no bruits. No lymphadenopathy or thryomegaly appreciated. Cor: PMI normal. Irregular. Tachy  No rubs, gallops or  murmurs. Lungs: clear Abdomen: soft, nontender, nondistended. No hepatosplenomegaly. No bruits or masses. Good bowel sounds. Extremities: no cyanosis, clubbing, rash, edema Neuro: alert & orientedx3, cranial nerves grossly intact. Moves all 4 extremities w/o difficulty. Affect pleasant.  ECG: AF 127 iRBBB  ASSESSMENT & PLAN:  1) Paroxysmal AF with RVR - Now back in AF. Will restart amio 400 bid and plan DC-CV early next week. Continue Xarelto. - May have component of OSA   2) Hypothyroid - Followed by PCP   Glori Bickers MD 10:47 AM  09/09/2014

## 2014-09-09 NOTE — Patient Instructions (Signed)
Start Amiodarone 400 mg (2 tabs) Twice daily   Your physician has recommended that you have a Cardioversion (DCCV). Electrical Cardioversion uses a jolt of electricity to your heart either through paddles or wired patches attached to your chest. This is a controlled, usually prescheduled, procedure. Defibrillation is done under light anesthesia in the hospital, and you usually go home the day of the procedure. This is done to get your heart back into a normal rhythm. You are not awake for the procedure. Please see the instruction sheet given to you today.  Monday 9/21, SEE INSTRUCTION SHEET  Your physician recommends that you schedule a follow-up appointment in: 1 month with Dr Haroldine Laws

## 2014-09-09 NOTE — Addendum Note (Signed)
Encounter addended by: Scarlette Calico, RN on: 09/09/2014 11:11 AM<BR>     Documentation filed: Patient Instructions Section, Orders

## 2014-09-10 ENCOUNTER — Encounter (HOSPITAL_COMMUNITY): Payer: Self-pay | Admitting: Pharmacy Technician

## 2014-09-10 ENCOUNTER — Other Ambulatory Visit: Payer: Self-pay | Admitting: *Deleted

## 2014-09-10 ENCOUNTER — Telehealth (HOSPITAL_COMMUNITY): Payer: Self-pay | Admitting: Cardiology

## 2014-09-10 NOTE — Addendum Note (Signed)
Encounter addended by: Asencion Gowda, CCT on: 09/10/2014  8:51 AM<BR>     Documentation filed: Charges VN

## 2014-09-10 NOTE — Telephone Encounter (Signed)
Pt scheduled for DCCV on 09/14/14 Cpt code 92960 icd [phone]- [phone] With pts current insurance- Medicare A and B No pre cert is required

## 2014-09-14 ENCOUNTER — Ambulatory Visit (HOSPITAL_COMMUNITY)
Admission: RE | Admit: 2014-09-14 | Discharge: 2014-09-14 | Disposition: A | Discharge status: Home / Self Care | Payer: Medicare Other | Source: Ambulatory Visit | Attending: Internal Medicine | Admitting: Internal Medicine

## 2014-09-14 ENCOUNTER — Encounter (HOSPITAL_COMMUNITY): Payer: Self-pay | Admitting: *Deleted

## 2014-09-14 ENCOUNTER — Encounter (HOSPITAL_COMMUNITY)
Admission: RE | Disposition: A | Discharge status: Home / Self Care | Payer: Self-pay | Source: Ambulatory Visit | Attending: Internal Medicine

## 2014-09-14 ENCOUNTER — Encounter (HOSPITAL_COMMUNITY): Payer: Medicare Other | Admitting: Anesthesiology

## 2014-09-14 ENCOUNTER — Ambulatory Visit (HOSPITAL_COMMUNITY): Payer: Medicare Other | Admitting: Anesthesiology

## 2014-09-14 DIAGNOSIS — E785 Hyperlipidemia, unspecified: Secondary | ICD-10-CM | POA: Diagnosis not present

## 2014-09-14 DIAGNOSIS — Z6838 Body mass index (BMI) 38.0-38.9, adult: Secondary | ICD-10-CM | POA: Insufficient documentation

## 2014-09-14 DIAGNOSIS — I4891 Unspecified atrial fibrillation: Secondary | ICD-10-CM | POA: Insufficient documentation

## 2014-09-14 DIAGNOSIS — Z7901 Long term (current) use of anticoagulants: Secondary | ICD-10-CM | POA: Insufficient documentation

## 2014-09-14 DIAGNOSIS — E039 Hypothyroidism, unspecified: Secondary | ICD-10-CM | POA: Diagnosis not present

## 2014-09-14 DIAGNOSIS — Z86711 Personal history of pulmonary embolism: Secondary | ICD-10-CM | POA: Diagnosis not present

## 2014-09-14 DIAGNOSIS — I480 Paroxysmal atrial fibrillation: Secondary | ICD-10-CM

## 2014-09-14 HISTORY — PX: CARDIOVERSION: SHX1299

## 2014-09-14 SURGERY — CARDIOVERSION
Anesthesia: Monitor Anesthesia Care

## 2014-09-14 MED ORDER — SODIUM CHLORIDE 0.9 % IV SOLN
INTRAVENOUS | Status: DC
  Administered 2014-09-14: 500 mL via INTRAVENOUS

## 2014-09-14 NOTE — Transfer of Care (Signed)
Immediate Anesthesia Transfer of Care Note  Patient: [name]  Procedure(s) Performed: Procedure(s): CARDIOVERSION (N/A)  Patient Location: Endoscopy Unit  Anesthesia Type:MAC  Level of Consciousness: awake, alert  and oriented  Airway & Oxygen Therapy: Patient Spontanous Breathing and Patient connected to nasal cannula oxygen  Post-op Assessment: Report given to PACU RN and Post -op Vital signs reviewed and stable  Post vital signs: Reviewed and stable  Complications: No apparent anesthesia complications

## 2014-09-14 NOTE — Anesthesia Postprocedure Evaluation (Signed)
Anesthesia Post Note  Patient: [name]  Procedure(s) Performed: Procedure(s) (LRB): CARDIOVERSION (N/A)  Anesthesia type: MAC  Patient location: PACU  Post pain: Pain level controlled and Adequate analgesia  Post assessment: Post-op Vital signs reviewed, Patient's Cardiovascular Status Stable and Respiratory Function Stable  Last Vitals:  Filed Vitals:   09/14/14 1205  BP: 105/50  Pulse: 49  Temp:   Resp: 16    Post vital signs: Reviewed and stable  Level of consciousness: awake, alert  and oriented  Complications: No apparent anesthesia complications

## 2014-09-14 NOTE — Interval H&P Note (Signed)
History and Physical Interval Note:  09/14/2014 11:12 AM  [name]  has presented today for surgery, with the diagnosis of AFIB  The various methods of treatment have been discussed with the patient and family. After consideration of risks, benefits and other options for treatment, the patient has consented to  Procedure(s): CARDIOVERSION (N/A) as a surgical intervention .  The patient's history has been reviewed, patient examined, no change in status, stable for surgery.  I have reviewed the patient's chart and labs.  Questions were answered to the patient's satisfaction.     Daniel Bensimhon

## 2014-09-14 NOTE — H&P (View-Only) (Signed)
Patient ID: [name], female   DOB: 01-Feb-1941, 73 y.o.   MRN: [id number] HPI:  [name] is a 73 y/o woman who was admitted in 11/13 with acute bilateral PE, rapid AF and heart failure after a long plane trip. .EF 40-45%.  On 10/28/12 Unsuccessful DCCV . Loaded amio and underwent repeat DC-CV on 11/13. Maintained on Xarelto. Ace held to reduce hypotension.   In 11/13 had recurrent AFL.  Amio increased to 400 bid. On 12/10/12 successful DC-CV   04/22/13 ECHO EF 55-60%  Follow up: Amiodarone stopped stopped 9/14. Returns today for f/u. Has felt run down for about a month. +DOE. No palpitations. No edema. But has gained weight - 30 pounds from last September. Sleeps poorly. Has been compliant with Xarelto without bleeding.    ROS: All systems negative except as listed in HPI, PMH and Problem List.  Past Medical History  Diagnosis Date  . Hyperlipidemia   . Fasting hyperglycemia     A1C  within normal limits  . Diverticulosis 2002    Dr. Fuller Plan  . Personal history of colonic polyps 2002    tubular adenoma  . Tortuous colon   . Vitamin D deficiency   . Pancreatitis due to common bile duct stone   . Other abnormal glucose 2011    102    Current Outpatient Prescriptions  Medication Sig Dispense Refill  . levothyroxine (SYNTHROID, LEVOTHROID) 50 MCG tablet Take 1/2 tab by mouth daily  45 tablet  1  . metoprolol tartrate (LOPRESSOR) 25 MG tablet TAKE 1 TABLET DAILY.  90 tablet  3  . XARELTO 20 MG TABS tablet TAKE 1 TABLET ONCE DAILY.  60 tablet  6   No current facility-administered medications for this encounter.   Filed Vitals:   09/09/14 1032  BP: 98/56  Pulse: 127  Weight: 241 lb 12.8 oz (109.68 kg)  SpO2: 96%   PHYSICAL EXAM: General:  Fatigued appearing. No resp difficulty (husband present) HEENT: normal Neck: supple. JVP flat. Carotids 2+ bilaterally; no bruits. No lymphadenopathy or thryomegaly appreciated. Cor: PMI normal. Irregular. Tachy  No rubs, gallops or  murmurs. Lungs: clear Abdomen: soft, nontender, nondistended. No hepatosplenomegaly. No bruits or masses. Good bowel sounds. Extremities: no cyanosis, clubbing, rash, edema Neuro: alert & orientedx3, cranial nerves grossly intact. Moves all 4 extremities w/o difficulty. Affect pleasant.  ECG: AF 127 iRBBB  ASSESSMENT & PLAN:  1) Paroxysmal AF with RVR - Now back in AF. Will restart amio 400 bid and plan DC-CV early next week. Continue Xarelto. - May have component of OSA   2) Hypothyroid - Followed by PCP   Glori Bickers MD 10:47 AM  09/09/2014

## 2014-09-14 NOTE — Anesthesia Preprocedure Evaluation (Signed)
Anesthesia Evaluation  Patient identified by MRN, date of birth, ID band Patient awake    Reviewed: Allergy & Precautions, H&P , NPO status , Patient's Chart, lab work & pertinent test results  Airway Mallampati: II  Neck ROM: full    Dental   Pulmonary neg pulmonary ROS,          Cardiovascular negative cardio ROS  + dysrhythmias Atrial Fibrillation     Neuro/Psych    GI/Hepatic   Endo/Other  Hypothyroidism Morbid obesity  Renal/GU      Musculoskeletal  (+) Arthritis -,   Abdominal   Peds  Hematology   Anesthesia Other Findings   Reproductive/Obstetrics                           Anesthesia Physical Anesthesia Plan  ASA: III  Anesthesia Plan: MAC   Post-op Pain Management:    Induction: Intravenous  Airway Management Planned: Mask  Additional Equipment:   Intra-op Plan:   Post-operative Plan:   Informed Consent: I have reviewed the patients History and Physical, chart, labs and discussed the procedure including the risks, benefits and alternatives for the proposed anesthesia with the patient or authorized representative who has indicated his/her understanding and acceptance.     Plan Discussed with: CRNA, Anesthesiologist and Surgeon  Anesthesia Plan Comments:         Anesthesia Quick Evaluation

## 2014-09-14 NOTE — CV Procedure (Signed)
     DIRECT CURRENT CARDIOVERSION  NAME:  [name]   MRN: [id number] DOB:  01/02/1941   ADMIT DATE: 09/14/2014   INDICATIONS: Atrial fibrillation    PROCEDURE:   Informed consent was obtained prior to the procedure. The risks, benefits and alternatives for the procedure were discussed and the patient comprehended these risks. Once an appropriate time out was taken, the patient had the defibrillator pads placed in the anterior and posterior position. The patient then underwent sedation by the anesthesia service. Once an appropriate level of sedation was achieved, the patient received a single biphasic, synchronized 150J shock with prompt conversion to sinus bradycardia. No apparent complications.   Plan  Continue amio 400 bid for 2 weeks then switch to 200 bid D/c metoprolol due to bradycardia with HRs in 40s.  Gretel Cantu,MD 11:30 AM

## 2014-09-15 ENCOUNTER — Encounter (HOSPITAL_COMMUNITY): Payer: Self-pay | Admitting: Internal Medicine

## 2014-09-22 ENCOUNTER — Other Ambulatory Visit: Payer: Self-pay | Admitting: Internal Medicine

## 2014-10-09 ENCOUNTER — Ambulatory Visit (HOSPITAL_BASED_OUTPATIENT_CLINIC_OR_DEPARTMENT_OTHER)
Admission: RE | Admit: 2014-10-09 | Discharge: 2014-10-09 | Disposition: A | Discharge status: Home / Self Care | Payer: Medicare Other | Source: Ambulatory Visit | Attending: Internal Medicine | Admitting: Internal Medicine

## 2014-10-09 ENCOUNTER — Encounter (HOSPITAL_COMMUNITY)
Admission: RE | Disposition: A | Discharge status: Home / Self Care | Payer: Self-pay | Source: Ambulatory Visit | Attending: Cardiovascular Disease

## 2014-10-09 ENCOUNTER — Telehealth (HOSPITAL_COMMUNITY): Payer: Self-pay | Admitting: Cardiology

## 2014-10-09 ENCOUNTER — Encounter (HOSPITAL_COMMUNITY): Payer: Self-pay | Admitting: Anesthesiology

## 2014-10-09 ENCOUNTER — Other Ambulatory Visit: Payer: Self-pay

## 2014-10-09 ENCOUNTER — Encounter (HOSPITAL_COMMUNITY): Payer: Medicare Other | Admitting: Anesthesiology

## 2014-10-09 ENCOUNTER — Ambulatory Visit (HOSPITAL_COMMUNITY): Payer: Medicare Other | Admitting: Anesthesiology

## 2014-10-09 ENCOUNTER — Encounter (HOSPITAL_COMMUNITY): Payer: Self-pay

## 2014-10-09 ENCOUNTER — Ambulatory Visit (HOSPITAL_COMMUNITY)
Admission: RE | Admit: 2014-10-09 | Discharge: 2014-10-09 | Disposition: A | Discharge status: Home / Self Care | Payer: Medicare Other | Source: Ambulatory Visit | Attending: Cardiovascular Disease | Admitting: Cardiovascular Disease

## 2014-10-09 ENCOUNTER — Encounter (HOSPITAL_COMMUNITY): Payer: Self-pay | Admitting: Cardiology

## 2014-10-09 VITALS — BP 122/88 | HR 122 | Wt 241.4 lb

## 2014-10-09 DIAGNOSIS — I483 Typical atrial flutter: Secondary | ICD-10-CM | POA: Diagnosis not present

## 2014-10-09 DIAGNOSIS — E785 Hyperlipidemia, unspecified: Secondary | ICD-10-CM | POA: Insufficient documentation

## 2014-10-09 DIAGNOSIS — I482 Chronic atrial fibrillation, unspecified: Secondary | ICD-10-CM

## 2014-10-09 DIAGNOSIS — E039 Hypothyroidism, unspecified: Secondary | ICD-10-CM | POA: Insufficient documentation

## 2014-10-09 DIAGNOSIS — Z7901 Long term (current) use of anticoagulants: Secondary | ICD-10-CM | POA: Insufficient documentation

## 2014-10-09 DIAGNOSIS — I5022 Chronic systolic (congestive) heart failure: Secondary | ICD-10-CM | POA: Diagnosis not present

## 2014-10-09 DIAGNOSIS — I480 Paroxysmal atrial fibrillation: Secondary | ICD-10-CM | POA: Diagnosis not present

## 2014-10-09 DIAGNOSIS — I4892 Unspecified atrial flutter: Secondary | ICD-10-CM | POA: Insufficient documentation

## 2014-10-09 HISTORY — PX: CARDIOVERSION: SHX1299

## 2014-10-09 LAB — COMPREHENSIVE METABOLIC PANEL
ALT: 16 U/L (ref 0–35)
ANION GAP: 16 — AB (ref 5–15)
AST: 23 U/L (ref 0–37)
Albumin: 4.2 g/dL (ref 3.5–5.2)
Alkaline Phosphatase: 75 U/L (ref 39–117)
BUN: 21 mg/dL (ref 6–23)
CO2: 20 mEq/L (ref 19–32)
Calcium: 9.6 mg/dL (ref 8.4–10.5)
Chloride: 103 mEq/L (ref 96–112)
Creatinine, Ser: 0.89 mg/dL (ref 0.50–1.10)
GFR calc non Af Amer: 62 mL/min — ABNORMAL LOW (ref >90)
GFR, EST AFRICAN AMERICAN: 72 mL/min — AB (ref >90)
Glucose, Bld: 93 mg/dL (ref 70–99)
Potassium: 4.4 mEq/L (ref 3.7–5.3)
SODIUM: 139 meq/L (ref 137–147)
TOTAL PROTEIN: 7.9 g/dL (ref 6.0–8.3)
Total Bilirubin: 0.8 mg/dL (ref 0.3–1.2)

## 2014-10-09 LAB — PROTIME-INR
INR: 2.79 — AB (ref 0.00–1.49)
PROTHROMBIN TIME: 29.7 s — AB (ref 11.6–15.2)

## 2014-10-09 LAB — TSH: TSH: 6.97 u[IU]/mL — AB (ref 0.350–4.500)

## 2014-10-09 SURGERY — CARDIOVERSION
Anesthesia: Monitor Anesthesia Care

## 2014-10-09 MED ORDER — SODIUM CHLORIDE 0.9 % IJ SOLN: 3.0000 mL | INTRAMUSCULAR | Status: DC | PRN

## 2014-10-09 MED ORDER — SODIUM CHLORIDE 0.9 % IV SOLN: INTRAVENOUS | Status: DC

## 2014-10-09 MED ORDER — HYDROCORTISONE 1 % EX CREA: 1.0000 "application " | TOPICAL_CREAM | Freq: Three times a day (TID) | CUTANEOUS | Status: DC | PRN

## 2014-10-09 MED ORDER — SODIUM CHLORIDE 0.9 % IJ SOLN: 3.0000 mL | Freq: Two times a day (BID) | INTRAMUSCULAR | Status: DC

## 2014-10-09 MED ORDER — SODIUM CHLORIDE 0.9 % IV SOLN
INTRAVENOUS | Status: DC | PRN
  Administered 2014-10-09: 13:00:00 via INTRAVENOUS

## 2014-10-09 MED ORDER — SODIUM CHLORIDE 0.9 % IV SOLN
250.0000 mL | INTRAVENOUS | Status: DC
  Administered 2014-10-09: 100 mL/h via INTRAVENOUS

## 2014-10-09 NOTE — Transfer of Care (Signed)
Immediate Anesthesia Transfer of Care Note  Patient: [name]  Procedure(s) Performed: Procedure(s): CARDIOVERSION (N/A)  Patient Location: PACU  Anesthesia Type:MAC  Level of Consciousness: awake, alert  and oriented  Airway & Oxygen Therapy: Patient Spontanous Breathing and Patient connected to nasal cannula oxygen  Post-op Assessment: Report given to PACU RN, Post -op Vital signs reviewed and stable and Patient moving all extremities X 4  Post vital signs: Reviewed and stable  Complications: No apparent anesthesia complications

## 2014-10-09 NOTE — Anesthesia Preprocedure Evaluation (Addendum)
Anesthesia Evaluation  Patient identified by MRN, date of birth, ID band  Reviewed: Allergy & Precautions, H&P , NPO status , Patient's Chart, lab work & pertinent test results  Airway Mallampati: II TM Distance: >3 FB Neck ROM: full    Dental  (+) Dental Advidsory Given, Teeth Intact   Pulmonary          Cardiovascular + dysrhythmias Atrial Fibrillation     Neuro/Psych    GI/Hepatic   Endo/Other  Hypothyroidism Morbid obesity  Renal/GU      Musculoskeletal   Abdominal   Peds  Hematology   Anesthesia Other Findings   Reproductive/Obstetrics                         Anesthesia Physical Anesthesia Plan  ASA: III  Anesthesia Plan:    Post-op Pain Management:    Induction:   Airway Management Planned:   Additional Equipment:   Intra-op Plan:   Post-operative Plan:   Informed Consent:   Dental Advisory Given  Plan Discussed with: Anesthesiologist, CRNA and Surgeon  Anesthesia Plan Comments:         Anesthesia Quick Evaluation

## 2014-10-09 NOTE — Telephone Encounter (Signed)
Pt scheduled for DCCV on 10/09/14 Cpt code-92960 icd code- I48.91 With pts current insurance- [employer] No pre cert req'd

## 2014-10-09 NOTE — Progress Notes (Signed)
EKG CRITICAL VALUE     12 lead EKG performed.  Critical value noted.Dr. Aundra Dubin notified ,d RN notifie.   Jamaurion Slemmer L, CCT 10/09/2014 10:38 AM

## 2014-10-09 NOTE — Op Note (Signed)
Procedure: Electrical Cardioversion Indications:  Atrial Flutter  Procedure Details:  Consent: Risks of procedure as well as the alternatives and risks of each were explained to the (patient/caregiver).  Consent for procedure obtained.  Time Out: Verified patient identification, verified procedure, site/side was marked, verified correct patient position, special equipment/implants available, medications/allergies/relevent history reviewed, required imaging and test results available.  Performed  Patient placed on cardiac monitor, pulse oximetry, supplemental oxygen as necessary.  Sedation given: propofol IV, anesthesiology, Dr. Oletta Lamas Pacer pads placed anterior and posterior chest.  Cardioverted 1 time(s).  Cardioverted at 120J sync biphasic  Evaluation: Findings: Post procedure EKG shows: NSR Complications: None Patient did tolerate procedure well.  Time Spent Directly with the Patient:  45 minutes   [name] 10/09/2014, 1:23 PM

## 2014-10-09 NOTE — Interval H&P Note (Signed)
History and Physical Interval Note:  10/09/2014 12:51 PM  [name]  has presented today for surgery, with the diagnosis of afib  The various methods of treatment have been discussed with the patient and family. After consideration of risks, benefits and other options for treatment, the patient has consented to  Procedure(s): CARDIOVERSION (N/A) as a surgical intervention .  The patient's history has been reviewed, patient examined, no change in status, stable for surgery.  I have reviewed the patient's chart and labs.  Questions were answered to the patient's satisfaction.     [name]

## 2014-10-09 NOTE — Addendum Note (Signed)
Encounter addended by: Renee Pain, RN on: 10/09/2014 12:09 PM<BR>     Documentation filed: Visit Diagnoses, Orders

## 2014-10-09 NOTE — Anesthesia Postprocedure Evaluation (Signed)
  Anesthesia Post-op Note  Patient: [name]  Procedure(s) Performed: Procedure(s): CARDIOVERSION (N/A)  Patient Location: PACU  Anesthesia Type:MAC  Level of Consciousness: awake, alert  and oriented  Airway and Oxygen Therapy: Patient Spontanous Breathing and Patient connected to nasal cannula oxygen  Post-op Pain: none  Post-op Assessment: Post-op Vital signs reviewed, Patient's Cardiovascular Status Stable, Respiratory Function Stable, Patent Airway, No signs of Nausea or vomiting and Adequate PO intake  Post-op Vital Signs: Reviewed and stable  Last Vitals:  Filed Vitals:   10/09/14 1317  BP: 138/78  Pulse: 73  Temp:   Resp: 19    Complications: No apparent anesthesia complications

## 2014-10-09 NOTE — Patient Instructions (Signed)
Your physician has recommended that you have a Cardioversion (DCCV). Electrical Cardioversion uses a jolt of electricity to your heart either through paddles or wired patches attached to your chest. This is a controlled, usually prescheduled, procedure. Defibrillation is done under light anesthesia in the hospital, and you usually go home the day of the procedure. This is done to get your heart back into a normal rhythm. You are not awake for the procedure. Please see the instruction sheet given to you today.  Your physician recommends that you schedule a follow-up appointment in: 10 days

## 2014-10-09 NOTE — H&P (View-Only) (Signed)
Patient ID: [name], female   DOB: 1941/01/06, 73 y.o.   MRN: [id number] PHPI:  Ms Salvo is a 73 y/o woman who was admitted in 11/13 with acute bilateral PE, rapid AF and heart failure after a long plane trip. .EF 40-45%.  On 10/28/12 Unsuccessful DCCV . Loaded amio and underwent repeat DC-CV on 11/13. Maintained on Xarelto. Ace held to reduce hypotension.   In 11/13 had recurrent AFL.  Amio increased to 400 bid. On 12/10/12 successful DC-CV.  Amiodarone was eventually stopped.   04/22/13 ECHO EF 65%, mild LVH, normal RV size and systolic function, PA systolic pressure 35 mmHg .  At last appointment, she was in atrial fibrillation.  Amiodarone was restarted and she was cardioverted to NSR on 09/14/14.  She has been on amiodarone 200 mg bid.   Today she feels great.  No palpitations.  No chest pain.  No exertional dyspnea.  However, HR was noted to be high and ECG showed atrial flutter with rate in 150s.  Interestingly, she was very symptomatic with atrial fibrillation but not with this episode of rapid atrial flutter.  I do not know how long she has been in atrial flutter.  She has been on Xarelto long-term without missing any doses.   ECG: atrial flutter, rate 159  Labs (7/15): K 4.2, creatinine 0.8  ROS: All systems negative except as listed in HPI, PMH and Problem List.  Past Medical History  Diagnosis Date  . Hyperlipidemia   . Fasting hyperglycemia     A1C  within normal limits  . Diverticulosis 2002    Dr. Fuller Plan  . Personal history of colonic polyps 2002    tubular adenoma  . Tortuous colon   . Vitamin D deficiency   . Pancreatitis due to common bile duct stone   . Other abnormal glucose 2011    102    Current Outpatient Prescriptions  Medication Sig Dispense Refill  . amiodarone (PACERONE) 200 MG tablet Take 200 mg by mouth 2 (two) times daily.      Marland Kitchen levothyroxine (SYNTHROID, LEVOTHROID) 50 MCG tablet TAKE (1/2) TABLET DAILY.  45 tablet  1  . rivaroxaban  (XARELTO) 20 MG TABS tablet Take 20 mg by mouth daily with supper.       No current facility-administered medications for this encounter.   Filed Vitals:   10/09/14 1006  BP: 122/88  Pulse: 122  Weight: 241 lb 6.4 oz (109.498 kg)  SpO2: 99%   PHYSICAL EXAM: General:  Fatigued appearing. No resp difficulty (husband present) HEENT: normal Neck: supple. JVP flat. Carotids 2+ bilaterally; no bruits. No lymphadenopathy or thryomegaly appreciated. Cor: PMI normal. Irregular. Tachy, regular, no rubs, gallops or murmurs. Lungs: clear Abdomen: soft, nontender, nondistended. No hepatosplenomegaly. No bruits or masses. Good bowel sounds. Extremities: no cyanosis, clubbing, rash, edema Neuro: alert & orientedx3, cranial nerves grossly intact. Moves all 4 extremities w/o difficulty. Affect pleasant.  ASSESSMENT & PLAN:  1) Atrial arrhythmias: History of atrial fibrillation with RVR, was cardioverted to NSR on amiodarone in 9/15 but today back in atrial flutter.  Rate is in the 150s but interestingly, she is asymptomatic.   - She has been on Xarelto long-term without missing doses, does not need TEE.  I will arrange for her to be cardioverted today.  - Continue amiodarone, may hold rhythm longer-term now with more amiodarone in her system compared to 9/15 cardioversion. Check CMET/TSH today.  - She will need echo when back in NSR.  -  I will refer her to Dr Rayann Heman for consideration of atrial flutter/fibrillation ablations now that atrial arrhythmias have recurred on amiodarone.  She would not be able to try dofetilide without a long-term amiodarone washout so ablation may be our best option here.  - May have component of OSA, will need to be addressed down the road.  - Followup in 10 days after DCCV.  2) Hypothyroid: Followed by PCP   Loralie Champagne MD 10:58 AM 10/09/2014

## 2014-10-09 NOTE — Progress Notes (Signed)
Patient ID: [name], female   DOB: 09-05-1941, 73 y.o.   MRN: [id number] PHPI:  [name] is a 73 y/o woman who was admitted in 11/13 with acute bilateral PE, rapid AF and heart failure after a long plane trip. .EF 40-45%.  On 10/28/12 Unsuccessful DCCV . Loaded amio and underwent repeat DC-CV on 11/13. Maintained on Xarelto. Ace held to reduce hypotension.   In 11/13 had recurrent AFL.  Amio increased to 400 bid. On 12/10/12 successful DC-CV.  Amiodarone was eventually stopped.   04/22/13 ECHO EF 65%, mild LVH, normal RV size and systolic function, PA systolic pressure 35 mmHg .  At last appointment, she was in atrial fibrillation.  Amiodarone was restarted and she was cardioverted to NSR on 09/14/14.  She has been on amiodarone 200 mg bid.   Today she feels great.  No palpitations.  No chest pain.  No exertional dyspnea.  However, HR was noted to be high and ECG showed atrial flutter with rate in 150s.  Interestingly, she was very symptomatic with atrial fibrillation but not with this episode of rapid atrial flutter.  I do not know how long she has been in atrial flutter.  She has been on Xarelto long-term without missing any doses.   ECG: atrial flutter, rate 159  Labs (7/15): K 4.2, creatinine 0.8  ROS: All systems negative except as listed in HPI, PMH and Problem List.  Past Medical History  Diagnosis Date  . Hyperlipidemia   . Fasting hyperglycemia     A1C  within normal limits  . Diverticulosis 2002    Dr. Fuller Plan  . Personal history of colonic polyps 2002    tubular adenoma  . Tortuous colon   . Vitamin D deficiency   . Pancreatitis due to common bile duct stone   . Other abnormal glucose 2011    102    Current Outpatient Prescriptions  Medication Sig Dispense Refill  . amiodarone (PACERONE) 200 MG tablet Take 200 mg by mouth 2 (two) times daily.      Marland Kitchen levothyroxine (SYNTHROID, LEVOTHROID) 50 MCG tablet TAKE (1/2) TABLET DAILY.  45 tablet  1  . rivaroxaban  (XARELTO) 20 MG TABS tablet Take 20 mg by mouth daily with supper.       No current facility-administered medications for this encounter.   Filed Vitals:   10/09/14 1006  BP: 122/88  Pulse: 122  Weight: 241 lb 6.4 oz (109.498 kg)  SpO2: 99%   PHYSICAL EXAM: General:  Fatigued appearing. No resp difficulty (husband present) HEENT: normal Neck: supple. JVP flat. Carotids 2+ bilaterally; no bruits. No lymphadenopathy or thryomegaly appreciated. Cor: PMI normal. Irregular. Tachy, regular, no rubs, gallops or murmurs. Lungs: clear Abdomen: soft, nontender, nondistended. No hepatosplenomegaly. No bruits or masses. Good bowel sounds. Extremities: no cyanosis, clubbing, rash, edema Neuro: alert & orientedx3, cranial nerves grossly intact. Moves all 4 extremities w/o difficulty. Affect pleasant.  ASSESSMENT & PLAN:  1) Atrial arrhythmias: History of atrial fibrillation with RVR, was cardioverted to NSR on amiodarone in 9/15 but today back in atrial flutter.  Rate is in the 150s but interestingly, she is asymptomatic.   - She has been on Xarelto long-term without missing doses, does not need TEE.  I will arrange for her to be cardioverted today.  - Continue amiodarone, may hold rhythm longer-term now with more amiodarone in her system compared to 9/15 cardioversion. Check CMET/TSH today.  - She will need echo when back in NSR.  -  I will refer her to Dr Rayann Heman for consideration of atrial flutter/fibrillation ablations now that atrial arrhythmias have recurred on amiodarone.  She would not be able to try dofetilide without a long-term amiodarone washout so ablation may be our best option here.  - May have component of OSA, will need to be addressed down the road.  - Followup in 10 days after DCCV.  2) Hypothyroid: Followed by PCP   Loralie Champagne MD 10:58 AM 10/09/2014

## 2014-10-10 NOTE — Addendum Note (Signed)
Encounter addended by: Vanessa Barbara, CCT on: 10/10/2014 11:30 AM<BR>     Documentation filed: Charges VN

## 2014-10-13 ENCOUNTER — Encounter (HOSPITAL_COMMUNITY): Payer: Self-pay | Admitting: Cardiovascular Disease

## 2014-10-26 ENCOUNTER — Ambulatory Visit (HOSPITAL_COMMUNITY)
Admission: RE | Admit: 2014-10-26 | Discharge: 2014-10-26 | Disposition: A | Discharge status: Home / Self Care | Payer: Medicare Other | Source: Ambulatory Visit | Attending: Internal Medicine | Admitting: Internal Medicine

## 2014-10-26 ENCOUNTER — Encounter (HOSPITAL_COMMUNITY): Payer: Self-pay

## 2014-10-26 VITALS — BP 114/82 | HR 131 | Wt 236.8 lb

## 2014-10-26 DIAGNOSIS — I4819 Other persistent atrial fibrillation: Secondary | ICD-10-CM

## 2014-10-26 DIAGNOSIS — E039 Hypothyroidism, unspecified: Secondary | ICD-10-CM | POA: Diagnosis not present

## 2014-10-26 DIAGNOSIS — I481 Persistent atrial fibrillation: Secondary | ICD-10-CM

## 2014-10-26 DIAGNOSIS — I498 Other specified cardiac arrhythmias: Secondary | ICD-10-CM | POA: Insufficient documentation

## 2014-10-26 DIAGNOSIS — I483 Typical atrial flutter: Secondary | ICD-10-CM

## 2014-10-26 MED ORDER — DILTIAZEM HCL ER COATED BEADS 360 MG PO CP24: 360.0000 mg | ORAL_CAPSULE | Freq: Every day | ORAL | Status: DC

## 2014-10-26 NOTE — Addendum Note (Signed)
Encounter addended by: Kerry Dory, CMA on: 10/26/2014 11:37 AM<BR>     Documentation filed: Medications, Dx Association, Patient Instructions Section, Orders

## 2014-10-26 NOTE — Patient Instructions (Signed)
STOP Amiodarone  START Cardizem CD 360 mg daily  Dr.Bensimhon recommends that your wear a overnight pulse oximetry device. You will be contacted to arrange delivery of the equipment needed for this.  Your physician recommends that you schedule a follow-up appointment in: 1 week

## 2014-10-26 NOTE — Progress Notes (Signed)
Patient ID: [name], female   DOB: September 14, 1941, 73 y.o.   MRN: [id number] PHPI:  [name] is a 73 y/o woman who was admitted in 11/13 with acute bilateral PE, rapid AF and heart failure after a long plane trip. .EF 40-45%.  On 10/28/12 Unsuccessful DCCV . Loaded amio and underwent repeat DC-CV on 11/13. Maintained on Xarelto. Ace held to reduce hypotension.   In 11/13 had recurrent AFL.  Amio increased to 400 bid. On 12/10/12 successful DC-CV.  Amiodarone was eventually stopped.   04/22/13 ECHO EF 65%, mild LVH, normal RV size and systolic function, PA systolic pressure 35 mmHg .  At last appointment, she was in atrial fibrillation.  Amiodarone was restarted and she was cardioverted to NSR on 09/14/14.  She has been on amiodarone 200 mg bid. Seen again on Oct 16. Asymptomatic. Was back in AF with RVR at 150. Underwent DC-CV. Has been on amiodarone 200 bid for > 3 months.   Continues to feel well  No palpitations.  No chest pain.  No exertional dyspnea or fatigue.   However, HR was noted to be high and ECG showed atrial flutter with rate in 150s.  Interestingly, she was very symptomatic with atrial fibrillation but not with this episode of rapid atrial flutter.  I do not know how long she has been in atrial flutter.  She has been on Xarelto long-term without missing any doses.   ECG: atrial flutter, rate 159  Labs (7/15): K 4.2, creatinine 0.8  ROS: All systems negative except as listed in HPI, PMH and Problem List.  Past Medical History  Diagnosis Date  . Hyperlipidemia   . Fasting hyperglycemia     A1C  within normal limits  . Diverticulosis 2002    Dr. Fuller Plan  . Personal history of colonic polyps 2002    tubular adenoma  . Tortuous colon   . Vitamin D deficiency   . Pancreatitis due to common bile duct stone   . Other abnormal glucose 2011    102    Current Outpatient Prescriptions  Medication Sig Dispense Refill  . amiodarone (PACERONE) 200 MG tablet Take 200 mg by mouth  2 (two) times daily.    Marland Kitchen levothyroxine (SYNTHROID, LEVOTHROID) 50 MCG tablet TAKE (1/2) TABLET DAILY. 45 tablet 1  . rivaroxaban (XARELTO) 20 MG TABS tablet Take 20 mg by mouth daily with supper.     No current facility-administered medications for this encounter.   Filed Vitals:   10/26/14 1039  BP: 114/82  Pulse: 131  Weight: 236 lb 12.8 oz (107.412 kg)  SpO2: 98%   PHYSICAL EXAM: General:  Fatigued appearing. No resp difficulty (husband present) HEENT: normal Neck: supple. JVP flat. Carotids 2+ bilaterally; no bruits. No lymphadenopathy or thryomegaly appreciated. Cor: PMI normal. Irregular. Tachy, regular, no rubs, gallops or murmurs. Lungs: clear Abdomen: soft, nontender, nondistended. No hepatosplenomegaly. No bruits or masses. Good bowel sounds. Extremities: no cyanosis, clubbing, rash, edema Neuro: alert & orientedx3, cranial nerves grossly intact. Moves all 4 extremities w/o difficulty. Affect pleasant.  ECG: AF 131  ASSESSMENT & PLAN:  1) Atrial arrhythmias: History of atrial fibrillation with RVR, was cardioverted to NSR on amiodarone in 9/15 but reverted back. Recardioverted on Oct 16. Now back in AF with RVR - She has clearly failed rhythm control with amiodarone.  - I think the options now are rate control versus referral for ablation for rhythm control. Given the fact that she is asymptomatic, I favor a trial  of rate control. If this fails can reconsider ablation.  Will stop amiodarone. Start Cardizem 360 daily. She will follow her HR on her Fit-Bit with goal of 60-90 bpm at rest.  - Continue Xarelto.  - May have component of OSA but refuses sleep study. Will place overnight oximeter.  - FWill see back next week to reassess adequacy of rate control  2) Hypothyroid: Followed by PCP. Recent TSH slightly elevated which may be due to amio. T4 not checked.    Glori Bickers MD 11:11 AM 10/26/2014

## 2014-10-27 NOTE — Addendum Note (Signed)
Encounter addended by: Asencion Gowda, CCT on: 10/27/2014  8:26 AM<BR>     Documentation filed: Charges VN

## 2014-11-02 ENCOUNTER — Ambulatory Visit (HOSPITAL_COMMUNITY)
Admission: RE | Admit: 2014-11-02 | Discharge: 2014-11-02 | Disposition: A | Discharge status: Home / Self Care | Payer: Medicare Other | Source: Ambulatory Visit | Attending: Internal Medicine | Admitting: Internal Medicine

## 2014-11-02 VITALS — BP 112/74 | HR 118 | Wt 236.0 lb

## 2014-11-02 DIAGNOSIS — I4891 Unspecified atrial fibrillation: Secondary | ICD-10-CM | POA: Insufficient documentation

## 2014-11-02 DIAGNOSIS — R9431 Abnormal electrocardiogram [ECG] [EKG]: Secondary | ICD-10-CM | POA: Diagnosis not present

## 2014-11-02 DIAGNOSIS — I4510 Unspecified right bundle-branch block: Secondary | ICD-10-CM | POA: Insufficient documentation

## 2014-11-02 DIAGNOSIS — Z79899 Other long term (current) drug therapy: Secondary | ICD-10-CM | POA: Diagnosis not present

## 2014-11-02 DIAGNOSIS — E039 Hypothyroidism, unspecified: Secondary | ICD-10-CM | POA: Insufficient documentation

## 2014-11-02 DIAGNOSIS — I481 Persistent atrial fibrillation: Secondary | ICD-10-CM

## 2014-11-02 DIAGNOSIS — I498 Other specified cardiac arrhythmias: Secondary | ICD-10-CM | POA: Insufficient documentation

## 2014-11-02 DIAGNOSIS — I4819 Other persistent atrial fibrillation: Secondary | ICD-10-CM

## 2014-11-02 DIAGNOSIS — Z7901 Long term (current) use of anticoagulants: Secondary | ICD-10-CM | POA: Insufficient documentation

## 2014-11-02 DIAGNOSIS — I252 Old myocardial infarction: Secondary | ICD-10-CM | POA: Diagnosis not present

## 2014-11-02 DIAGNOSIS — I4892 Unspecified atrial flutter: Secondary | ICD-10-CM | POA: Diagnosis not present

## 2014-11-02 DIAGNOSIS — E785 Hyperlipidemia, unspecified: Secondary | ICD-10-CM | POA: Insufficient documentation

## 2014-11-02 MED ORDER — METOPROLOL TARTRATE 25 MG PO TABS: 25.0000 mg | ORAL_TABLET | Freq: Two times a day (BID) | ORAL | Status: DC

## 2014-11-02 NOTE — Patient Instructions (Signed)
Lopressor 25 mg Twice daily   Your physician has recommended that you wear a holter monitor. Holter monitors are medical devices that record the heart's electrical activity. Doctors most often use these monitors to diagnose arrhythmias. Arrhythmias are problems with the speed or rhythm of the heartbeat. The monitor is a small, portable device. You can wear one while you do your normal daily activities. This is usually used to diagnose what is causing palpitations/syncope (passing out).  NEEDS ASAP  Your physician recommends that you schedule a follow-up appointment in: 1 week with Dr Haroldine Laws

## 2014-11-02 NOTE — Progress Notes (Signed)
Patient ID: [name], female   DOB: 1941/09/21, 73 y.o.   MRN: [id number]  HPI:  [name] is a 73 y/o woman who was admitted in 11/13 with acute bilateral PE, rapid AF and heart failure after a long plane trip. .EF 40-45%.  On 10/28/12 Unsuccessful DCCV . Loaded amio and underwent repeat DC-CV on 11/13. Maintained on Xarelto. Ace held to reduce hypotension.   In 11/13 had AFL.  Amio increased to 400 bid. On 12/10/12 successful DC-CV.  Amiodarone was eventually stopped.   04/22/13 ECHO EF 65%, mild LVH, normal RV size and systolic function, PA systolic pressure 35 mmHg .  Had recurrent AF in 9/15. Amiodarone was restarted and she was cardioverted to NSR on 09/14/14. Seen again on Oct 16. Asymptomatic. Was back in AFL with RVR at 150. Underwent DC-CV but reverted to AFL.   Follow-up: At last visit (10/26/14) was in AF at 130. Decided to pursue rate control strategy. Diltiazem 360 started. Continues to feel well  No palpitations.  No chest pain.  No exertional dyspnea or fatigue.   However, HR was noted to be high and ECG showed atrial flutter with rate in 150s.  Interestingly, she was very symptomatic with atrial fibrillation but not with this episode of rapid atrial flutter.  I do not know how long she has been in atrial flutter.  She has been on Xarelto long-term without missing any doses.   ECG: AFL 117  Labs (7/15): K 4.2, creatinine 0.8  ROS: All systems negative except as listed in HPI, PMH and Problem List.  Past Medical History  Diagnosis Date  . Hyperlipidemia   . Fasting hyperglycemia     A1C  within normal limits  . Diverticulosis 2002    Dr. Fuller Plan  . Personal history of colonic polyps 2002    tubular adenoma  . Tortuous colon   . Vitamin D deficiency   . Pancreatitis due to common bile duct stone   . Other abnormal glucose 2011    102    Current Outpatient Prescriptions  Medication Sig Dispense Refill  . diltiazem (CARDIZEM CD) 360 MG 24 hr capsule Take 1 capsule  (360 mg total) by mouth daily. 30 capsule 3  . levothyroxine (SYNTHROID, LEVOTHROID) 50 MCG tablet TAKE (1/2) TABLET DAILY. 45 tablet 1  . rivaroxaban (XARELTO) 20 MG TABS tablet Take 20 mg by mouth daily with supper.     No current facility-administered medications for this encounter.   Filed Vitals:   11/02/14 1529  BP: 112/74  Pulse: 118  Weight: 236 lb (107.049 kg)  SpO2: 98%   PHYSICAL EXAM: General:  Fatigued appearing. No resp difficulty (husband present) HEENT: normal Neck: supple. JVP flat. Carotids 2+ bilaterally; no bruits. No lymphadenopathy or thryomegaly appreciated. Cor: PMI normal.Tachy, regular, no rubs, gallops or murmurs. Lungs: clear Abdomen: soft, nontender, nondistended. No hepatosplenomegaly. No bruits or masses. Good bowel sounds. Extremities: no cyanosis, clubbing, rash, edema Neuro: alert & orientedx3, cranial nerves grossly intact. Moves all 4 extremities w/o difficulty. Affect pleasant.  ECG: AFL 117 RBBB  ASSESSMENT & PLAN:  1) Atrial arrhythmias:  - She has failed amiodarone. I have reviewed all her ECGs in computer dating back to 2012. She has both AF and AFL. Currently in AFL with RVR despite cardizem 360 daily. Will add lopressor 25 bid and place 48 holter. If predominant rhythm is AFL with refer to EP to consider AFL ablation. Continue Xarelto. - May have component of OSA but refuses  sleep study. At last visit we ordered overnight oximeter.  2) Hypothyroid: Followed by PCP. Recent TSH slightly elevated which may be due to amio.   Glori Bickers MD 4:00 PM 11/02/2014

## 2014-11-03 ENCOUNTER — Encounter (INDEPENDENT_AMBULATORY_CARE_PROVIDER_SITE_OTHER): Payer: Medicare Other

## 2014-11-03 ENCOUNTER — Encounter: Payer: Self-pay | Admitting: *Deleted

## 2014-11-03 DIAGNOSIS — I481 Persistent atrial fibrillation: Secondary | ICD-10-CM

## 2014-11-03 DIAGNOSIS — I4819 Other persistent atrial fibrillation: Secondary | ICD-10-CM

## 2014-11-03 DIAGNOSIS — I4892 Unspecified atrial flutter: Secondary | ICD-10-CM

## 2014-11-03 NOTE — Progress Notes (Signed)
Patient ID: [name], female   DOB: [birth year], 73 y.o.   MRN: [id number] [facility] 48 hour holter monitor applied to patient.

## 2014-11-09 ENCOUNTER — Ambulatory Visit (HOSPITAL_COMMUNITY)
Admission: RE | Admit: 2014-11-09 | Discharge: 2014-11-09 | Disposition: A | Discharge status: Home / Self Care | Payer: Medicare Other | Source: Ambulatory Visit | Attending: Internal Medicine | Admitting: Internal Medicine

## 2014-11-09 VITALS — BP 108/68 | HR 79 | Wt 238.5 lb

## 2014-11-09 DIAGNOSIS — E039 Hypothyroidism, unspecified: Secondary | ICD-10-CM | POA: Diagnosis not present

## 2014-11-09 DIAGNOSIS — I4892 Unspecified atrial flutter: Secondary | ICD-10-CM | POA: Diagnosis not present

## 2014-11-09 DIAGNOSIS — Z79899 Other long term (current) drug therapy: Secondary | ICD-10-CM | POA: Insufficient documentation

## 2014-11-09 DIAGNOSIS — I4891 Unspecified atrial fibrillation: Secondary | ICD-10-CM

## 2014-11-09 DIAGNOSIS — Z7901 Long term (current) use of anticoagulants: Secondary | ICD-10-CM | POA: Insufficient documentation

## 2014-11-09 NOTE — Progress Notes (Addendum)
Patient ID: [name], female   DOB: 1941/03/17, 73 y.o.   MRN: [id number]  HPI:  [name] is a 73 y/o woman who was admitted in 11/13 with acute bilateral PE, rapid AF and heart failure after a long plane trip. .EF 40-45%.  On 10/28/12 Unsuccessful DCCV . Loaded amio and underwent repeat DC-CV on 11/13. Maintained on Xarelto. Ace held to reduce hypotension.   In 11/13 had AFL.  Amio increased to 400 bid. On 12/10/12 successful DC-CV.  Amiodarone was eventually stopped.   04/22/13 ECHO EF 65%, mild LVH, normal RV size and systolic function, PA systolic pressure 35 mmHg .  Had recurrent AF in 9/15. Amiodarone was restarted and she was cardioverted to NSR on 09/14/14. Seen again on Oct 16. Asymptomatic. Was back in AFL with RVR at 150. Underwent DC-CV but reverted to AFL.   Follow-up: Have decided to pursue rate control strategy. At last visit (11/02/14) was in AFL at 117.Metoprolol 25 bid added to Diltiazem 360 daily. 48 hour holter placed to look at burden of AFL vs AF and overall rate. On Fit Bit HR ranging 70-115.  Continues to feel well  No palpitations.  No chest pain.  No exertional dyspnea or fatigue.   She has been on Xarelto long-term without missing any doses.    48-hour holter: Persistent AFL/AF Minimum rate 68. Max rate 120. Mean 100 ECG: AFL 58  Labs (7/15): K 4.2, creatinine 0.8  ROS: All systems negative except as listed in HPI, PMH and Problem List.  Past Medical History  Diagnosis Date  . Hyperlipidemia   . Fasting hyperglycemia     A1C  within normal limits  . Diverticulosis 2002    Dr. Fuller Plan  . Personal history of colonic polyps 2002    tubular adenoma  . Tortuous colon   . Vitamin D deficiency   . Pancreatitis due to common bile duct stone   . Other abnormal glucose 2011    102    Current Outpatient Prescriptions  Medication Sig Dispense Refill  . diltiazem (CARDIZEM CD) 360 MG 24 hr capsule Take 1 capsule (360 mg total) by mouth daily. 30 capsule 3  .  levothyroxine (SYNTHROID, LEVOTHROID) 50 MCG tablet TAKE (1/2) TABLET DAILY. 45 tablet 1  . levothyroxine (SYNTHROID, LEVOTHROID) 50 MCG tablet Take 25 mcg by mouth daily before breakfast.    . metoprolol tartrate (LOPRESSOR) 25 MG tablet Take 1 tablet (25 mg total) by mouth 2 (two) times daily. 60 tablet 3  . rivaroxaban (XARELTO) 20 MG TABS tablet Take 20 mg by mouth daily with supper.     No current facility-administered medications for this encounter.   Filed Vitals:   11/09/14 1456  BP: 108/68  Pulse: 79  Weight: 238 lb 8 oz (108.183 kg)  SpO2: 98%   PHYSICAL EXAM: General:  Fatigued appearing. No resp difficulty (husband present) HEENT: normal Neck: supple. JVP flat. Carotids 2+ bilaterally; no bruits. No lymphadenopathy or thryomegaly appreciated. Cor: PMI normal.Tachy, regular, no rubs, gallops or murmurs. Lungs: clear Abdomen: soft, nontender, nondistended. No hepatosplenomegaly. No bruits or masses. Good bowel sounds. Extremities: no cyanosis, clubbing, rash, edema Neuro: alert & orientedx3, cranial nerves grossly intact. Moves all 4 extremities w/o difficulty. Affect pleasant.  ECG: AFL 58 RBBB  ASSESSMENT & PLAN:  1) Atrial arrhythmias:  - She has failed amiodarone. I have reviewed all her ECGs in computer dating back to 2012. She has both AF and AFL. From Holter it seems like this  is mostly AFL. Rate overall well controlled but there are periods of high rates. Will continue cardizem 360 and lopressor 25 bid and refer to Dr. Rayann Heman to see if there is any role for AFL ablation. Continue Xarelto. - May have component of OSA but refuses sleep study. At last visit we ordered overnight oximeter.  2) Hypothyroid: Followed by PCP. Recent TSH slightly elevated which may be due to amio.   Glori Bickers MD 3:13 PM 11/09/2014

## 2014-11-09 NOTE — Patient Instructions (Signed)
You have been referred to Dr Rayann Heman  Your physician recommends that you schedule a follow-up appointment in: 2 months with Dr Haroldine Laws

## 2014-11-18 ENCOUNTER — Ambulatory Visit (INDEPENDENT_AMBULATORY_CARE_PROVIDER_SITE_OTHER): Payer: Medicare Other | Admitting: Internal Medicine

## 2014-11-18 ENCOUNTER — Encounter: Payer: Self-pay | Admitting: *Deleted

## 2014-11-18 ENCOUNTER — Encounter: Payer: Self-pay | Admitting: Internal Medicine

## 2014-11-18 VITALS — BP 126/88 | HR 118 | Ht 66.0 in | Wt 239.1 lb

## 2014-11-18 DIAGNOSIS — I483 Typical atrial flutter: Secondary | ICD-10-CM

## 2014-11-18 DIAGNOSIS — I4819 Other persistent atrial fibrillation: Secondary | ICD-10-CM

## 2014-11-18 DIAGNOSIS — I481 Persistent atrial fibrillation: Secondary | ICD-10-CM

## 2014-11-18 NOTE — Progress Notes (Signed)
   Primary Care Physician: William Hopper, MD Referring Physician: Dr. Bensimhon   [name] is a 73 y.o. female here today for EP evaluation of recent aflutter with rvr. Has history of being  admitted in 11/13 with acute bilateral PE, rapid AF and heart failure after a long plane trip. .EF 40-45%.  On 10/28/12 Unsuccessful DCCV . Loaded amio and underwent repeat DC-CV on 11/13. Maintained on Xarelto. Ace held to reduce hypotension.   In 11/13 had AFL. Amio increased to 400 bid. On 12/10/12 successful DC-CV. Amiodarone was eventually stopped.   04/22/13 ECHO EF 65%, mild LVH, normal RV size and systolic function, PA systolic pressure 35 mmHg .  Had recurrent AF in 9/15. Amiodarone was restarted and she was cardioverted to NSR on 09/14/14. Seen again on Oct 16. Asymptomatic. Was back in AFL with RVR at 150. Underwent DC-CV but reverted to AFL.   Rate control strategy was then pursued.. 11/02/14, she was in AFL at 117. Metoprolol 25 bid added to Diltiazem 360 daily. 48 hour holter placed to look at burden of AFL vs AF and overall rate. On Fit Bit HR ranging 70-115. Continues to feel well No palpitations. No chest pain. No exertional dyspnea or fatigue. She has been on Xarelto long-term without missing any doses.   Today, she denies symptoms of palpitations, chest pain, shortness of breath, orthopnea, PND, lower extremity edema, dizziness, presyncope, syncope, or neurologic sequela.  Exercises in water aerobics 5 days a week and is attending weight watchers.  Overall she states she feels great, but understands risk of long term  tachycardia on function of heart.The patient is tolerating medications without difficulties and is otherwise without complaint today.   Past Medical History  Diagnosis Date  . Hyperlipidemia   . Fasting hyperglycemia     A1C  within normal limits  . Diverticulosis 2002    Dr. Stark  . Personal history of colonic polyps 2002    tubular adenoma  .  Tortuous colon   . Vitamin D deficiency   . Pancreatitis due to common bile duct stone   . Other abnormal glucose 2011    102   Past Surgical History  Procedure Laterality Date  . Knee surgery       X 5 post accident, right  . Total knee arthroplasty  1997    Dr.Wainer,Right  . Cholecystectomy  1997    Pancreatitis secondary to stone  . Total knee arthroplasty  2009    Dr.Wainer, Left  . Tonsillectomy    . Colonoscopy  2013    negative  . Colonoscopy  2002    adenomatous polyp; Dr Stark  . Tee without cardioversion  10/28/2012    Procedure: TRANSESOPHAGEAL ECHOCARDIOGRAM (TEE);  Surgeon: Daniel R Bensimhon, MD;  Location: [facility];  Service: Cardiovascular;  Laterality: N/A;  . Cardioversion  10/28/2012    Procedure: CARDIOVERSION;  Surgeon: Daniel R Bensimhon, MD;  Location: [facility];  Service: Cardiovascular;  Laterality: N/A;  . Cardioversion  11/06/2012    Procedure: CARDIOVERSION;  Surgeon: Daniel R Bensimhon, MD;  Location: [facility];  Service: Cardiovascular;  Laterality: N/A;  . Cardioversion  12/10/2012    Procedure: CARDIOVERSION;  Surgeon: Daniel R Bensimhon, MD;  Location: [facility];  Service: Cardiovascular;  Laterality: N/A;  . Cardioversion N/A 09/14/2014    Procedure: CARDIOVERSION;  Surgeon: Daniel R Bensimhon, MD;  Location: [facility];  Service: Cardiovascular;  Laterality: N/A;  . Cardioversion N/A 10/09/2014    Procedure: CARDIOVERSION;    Surgeon: Mihai Croitoru, MD;  Location: [facility];  Service: Cardiovascular;  Laterality: N/A;    Current Outpatient Prescriptions  Medication Sig Dispense Refill  . diltiazem (CARDIZEM CD) 360 MG 24 hr capsule Take 1 capsule (360 mg total) by mouth daily. 30 capsule 3  . levothyroxine (SYNTHROID, LEVOTHROID) 50 MCG tablet TAKE (1/2) TABLET DAILY. (Patient taking differently: TAKE (1/2) TABLET BY MOUTH DAILY.) 45 tablet 1  . metoprolol tartrate (LOPRESSOR) 25 MG tablet Take 1 tablet (25 mg total) by mouth 2  (two) times daily. 60 tablet 3  . rivaroxaban (XARELTO) 20 MG TABS tablet Take 20 mg by mouth daily with supper.     No current facility-administered medications for this visit.    Allergies  Allergen Reactions  . Meperidine Hcl Rash  . Penicillins Rash    Rash as child    History   Social History  . Marital Status: Married    Spouse Name: N/A    Number of Children: 4  . Years of Education: N/A   Occupational History  . retired     RN   Social History Main Topics  . Smoking status: Never Smoker   . Smokeless tobacco: Never Used  . Alcohol Use: 0.0 oz/week     Comment:  1 drink /week or less  . Drug Use: No  . Sexual Activity: No   Other Topics Concern  . Not on file   Social History Narrative    Family History  Problem Relation Age of Onset  . Cancer Father     Oral, smoker  . Hypertension Mother   . Stroke Mother 79  . Lung cancer Maternal Uncle     smoker  . Cancer Brother     CNS Cancer  . Colon cancer Brother     late 60's  . Alcohol abuse Brother   . Diabetes Neg Hx   . Clotting disorder Neg Hx     ROS- All systems are reviewed and negative except as per the HPI above  Physical Exam: Filed Vitals:   11/18/14 1118  BP: 126/88  Pulse: 118  Height: 5' 6" (1.676 m)  Weight: 239 lb 1.9 oz (108.464 kg)    GEN- The patient is well appearing, alert and oriented x 3 today.   Head- normocephalic, atraumatic Eyes-  Sclera clear, conjunctiva pink Ears- hearing intact Oropharynx- clear Neck- supple, no JVP Lymph- no cervical lymphadenopathy Lungs- Clear to ausculation bilaterally, normal work of breathing Heart-Rapid, Irregular rate and rhythm, no murmurs, rubs or gallops, PMI not laterally displaced GI- soft, NT, ND, + BS Extremities- no clubbing, cyanosis, or edema MS- no significant deformity or atrophy Skin- no rash or lesion Psych- euthymic mood, full affect Neuro- strength and sensation are intact  EKG- Typical aflutter at 118 bpm.RBBB,  Twave abnomality.  Recent monitor reviewed which revealed aflutter/afib.  Echo-Left ventricle: Diastolic function measuremens are nondiagnostic. The cavity size was normal. Wall thickness was increased in a pattern of mild LVH. The estimated ejection fraction was 65%. Wall motion was normal; there were no regional wall motion abnormalities. - Right ventricle: The cavity size was normal. Systolic function was normal. - Pulmonary arteries: PA peak pressure: 35mm Hg (S). - Systemic veins: Mild dilitation of the IVC with partial collapse suggests mild increase in CVP.  Monitor reviewed, showing afib/flutter with rates 68-120 bpm.    Assessment and Plan:  1. Typical aflutter with RVR but with h/o afib  Patient is currently in aflutter and asymptomatic but   is concerned re long term  tachycardia on function of heart.  She has very difficult to control rates with atrial flutter.  V rates with AF seem better controlled.  Today, I discussed treatment options for both afib and atrial flutter.  I offered ablation for both.  She is interested in pursuing an aflutter ablation only-->  not afib ablation. She feels like she could live in afib with easier rarte control after an atrial flutter ablation.   Risk, benefits, and alternatives to EP study and radiofrequency ablation were discussed in detail today. These risks include but are not limited to stroke, bleeding, vascular damage, tamponade, perforation, damage to the heart and other structures, AV block requiring pacemaker, worsening renal function, and death. The patient understands these risk and wishes to proceed.  We will therefore proceed with catheter ablation for atrial flutter at the next available time.  Continue xarelto which pt has been on since 2013 without interruption.       

## 2014-11-18 NOTE — Patient Instructions (Addendum)
Your physician has recommended that you have an ablation. Catheter ablation is a medical procedure used to treat some cardiac arrhythmias (irregular heartbeats). During catheter ablation, a long, thin, flexible tube is put into a blood vessel in your groin (upper thigh), or neck. This tube is called an ablation catheter. It is then guided to your heart through the blood vessel. Radio frequency waves destroy small areas of heart tissue where abnormal heartbeats may cause an arrhythmia to start. Please see the instruction sheet given to you today.    See instruction sheet for ablation 

## 2014-12-08 ENCOUNTER — Other Ambulatory Visit (INDEPENDENT_AMBULATORY_CARE_PROVIDER_SITE_OTHER): Payer: Medicare Other | Admitting: *Deleted

## 2014-12-08 DIAGNOSIS — I483 Typical atrial flutter: Secondary | ICD-10-CM

## 2014-12-08 LAB — BASIC METABOLIC PANEL
BUN: 28 mg/dL — ABNORMAL HIGH (ref 6–23)
CO2: 25 mEq/L (ref 19–32)
CREATININE: 1 mg/dL (ref 0.4–1.2)
Calcium: 9.2 mg/dL (ref 8.4–10.5)
Chloride: 105 mEq/L (ref 96–112)
GFR: 57.53 mL/min — AB (ref >60.00)
Glucose, Bld: 109 mg/dL — ABNORMAL HIGH (ref 70–99)
Potassium: 4.3 mEq/L (ref 3.5–5.1)
SODIUM: 136 meq/L (ref 135–145)

## 2014-12-08 LAB — CBC WITH DIFFERENTIAL/PLATELET
BASOS ABS: 0 10*3/uL (ref 0.0–0.1)
Basophils Relative: 0.4 % (ref 0.0–3.0)
Eosinophils Absolute: 0.2 10*3/uL (ref 0.0–0.7)
Eosinophils Relative: 2.3 % (ref 0.0–5.0)
HCT: 38.5 % (ref 36.0–46.0)
HEMOGLOBIN: 12.6 g/dL (ref 12.0–15.0)
Lymphocytes Relative: 13 % (ref 12.0–46.0)
Lymphs Abs: 0.8 10*3/uL (ref 0.7–4.0)
MCHC: 32.8 g/dL (ref 30.0–36.0)
MCV: 91.3 fl (ref 78.0–100.0)
MONOS PCT: 5.9 % (ref 3.0–12.0)
Monocytes Absolute: 0.4 10*3/uL (ref 0.1–1.0)
NEUTROS ABS: 5.1 10*3/uL (ref 1.4–7.7)
NEUTROS PCT: 78.4 % — AB (ref 43.0–77.0)
Platelets: 301 10*3/uL (ref 150.0–400.0)
RBC: 4.22 Mil/uL (ref 3.87–5.11)
RDW: 14.3 % (ref 11.5–15.5)
WBC: 6.5 10*3/uL (ref 4.0–10.5)

## 2014-12-15 ENCOUNTER — Ambulatory Visit (HOSPITAL_COMMUNITY): Payer: Medicare Other | Admitting: Certified Registered"

## 2014-12-15 ENCOUNTER — Encounter (HOSPITAL_COMMUNITY): Payer: Self-pay | Admitting: Certified Registered"

## 2014-12-15 ENCOUNTER — Encounter (HOSPITAL_COMMUNITY)
Admission: RE | Disposition: A | Discharge status: Home / Self Care | Payer: Self-pay | Source: Ambulatory Visit | Attending: Internal Medicine

## 2014-12-15 ENCOUNTER — Ambulatory Visit (HOSPITAL_COMMUNITY)
Admission: RE | Admit: 2014-12-15 | Discharge: 2014-12-16 | Disposition: A | Discharge status: Home / Self Care | Payer: Medicare Other | Source: Ambulatory Visit | Attending: Internal Medicine | Admitting: Internal Medicine

## 2014-12-15 DIAGNOSIS — E039 Hypothyroidism, unspecified: Secondary | ICD-10-CM | POA: Diagnosis not present

## 2014-12-15 DIAGNOSIS — K5790 Diverticulosis of intestine, part unspecified, without perforation or abscess without bleeding: Secondary | ICD-10-CM | POA: Insufficient documentation

## 2014-12-15 DIAGNOSIS — Z86711 Personal history of pulmonary embolism: Secondary | ICD-10-CM | POA: Diagnosis not present

## 2014-12-15 DIAGNOSIS — E785 Hyperlipidemia, unspecified: Secondary | ICD-10-CM | POA: Diagnosis not present

## 2014-12-15 DIAGNOSIS — Z86010 Personal history of colonic polyps: Secondary | ICD-10-CM | POA: Diagnosis not present

## 2014-12-15 DIAGNOSIS — I4892 Unspecified atrial flutter: Secondary | ICD-10-CM | POA: Diagnosis present

## 2014-12-15 DIAGNOSIS — I483 Typical atrial flutter: Secondary | ICD-10-CM

## 2014-12-15 DIAGNOSIS — I4891 Unspecified atrial fibrillation: Secondary | ICD-10-CM | POA: Diagnosis not present

## 2014-12-15 DIAGNOSIS — Z880 Allergy status to penicillin: Secondary | ICD-10-CM | POA: Insufficient documentation

## 2014-12-15 DIAGNOSIS — Z7901 Long term (current) use of anticoagulants: Secondary | ICD-10-CM | POA: Insufficient documentation

## 2014-12-15 DIAGNOSIS — E669 Obesity, unspecified: Secondary | ICD-10-CM | POA: Diagnosis present

## 2014-12-15 HISTORY — PX: ATRIAL FLUTTER ABLATION: SHX5733

## 2014-12-15 HISTORY — DX: Paroxysmal atrial fibrillation: I48.0

## 2014-12-15 HISTORY — DX: Unspecified atrial flutter: I48.92

## 2014-12-15 HISTORY — DX: Acute embolism and thrombosis of unspecified deep veins of unspecified lower extremity: I82.409

## 2014-12-15 HISTORY — PX: SUPRAVENTRICULAR TACHYCARDIA ABLATION: SHX6106

## 2014-12-15 HISTORY — DX: Other pulmonary embolism without acute cor pulmonale: I26.99

## 2014-12-15 SURGERY — ATRIAL FLUTTER ABLATION
Anesthesia: General

## 2014-12-15 MED ORDER — SODIUM CHLORIDE 0.9 % IV SOLN: 250.0000 mL | INTRAVENOUS | Status: DC | PRN

## 2014-12-15 MED ORDER — ONDANSETRON HCL 4 MG/2ML IJ SOLN: 4.0000 mg | Freq: Once | INTRAMUSCULAR | Status: AC | PRN

## 2014-12-15 MED ORDER — ONDANSETRON HCL 4 MG/2ML IJ SOLN
INTRAMUSCULAR | Status: DC | PRN
  Administered 2014-12-15: 4 mg via INTRAVENOUS

## 2014-12-15 MED ORDER — ACETAMINOPHEN 325 MG PO TABS: 650.0000 mg | ORAL_TABLET | ORAL | Status: DC | PRN

## 2014-12-15 MED ORDER — LACTATED RINGERS IV SOLN
INTRAVENOUS | Status: DC | PRN
  Administered 2014-12-15: 13:00:00 via INTRAVENOUS

## 2014-12-15 MED ORDER — ONDANSETRON HCL 4 MG/2ML IJ SOLN: 4.0000 mg | Freq: Four times a day (QID) | INTRAMUSCULAR | Status: DC | PRN

## 2014-12-15 MED ORDER — RIVAROXABAN 20 MG PO TABS
20.0000 mg | ORAL_TABLET | Freq: Every day | ORAL | Status: DC
  Administered 2014-12-15: 20 mg via ORAL
  Filled 2014-12-15 (×2): qty 1

## 2014-12-15 MED ORDER — LIDOCAINE HCL (CARDIAC) 20 MG/ML IV SOLN
INTRAVENOUS | Status: DC | PRN
  Administered 2014-12-15: 40 mg via INTRAVENOUS

## 2014-12-15 MED ORDER — BUPIVACAINE HCL (PF) 0.25 % IJ SOLN
INTRAMUSCULAR | Status: AC
  Filled 2014-12-15: qty 30

## 2014-12-15 MED ORDER — OXYCODONE HCL 5 MG PO TABS: 5.0000 mg | ORAL_TABLET | Freq: Once | ORAL | Status: AC | PRN

## 2014-12-15 MED ORDER — LEVOTHYROXINE SODIUM 25 MCG PO TABS
25.0000 ug | ORAL_TABLET | Freq: Every day | ORAL | Status: DC
  Administered 2014-12-15 – 2014-12-16 (×2): 25 ug via ORAL
  Filled 2014-12-15 (×3): qty 1

## 2014-12-15 MED ORDER — PROPOFOL 10 MG/ML IV BOLUS
INTRAVENOUS | Status: DC | PRN
  Administered 2014-12-15: 180 mg via INTRAVENOUS

## 2014-12-15 MED ORDER — ARTIFICIAL TEARS OP OINT
TOPICAL_OINTMENT | OPHTHALMIC | Status: DC | PRN
  Administered 2014-12-15: 1 via OPHTHALMIC

## 2014-12-15 MED ORDER — OXYCODONE HCL 5 MG/5ML PO SOLN: 5.0000 mg | Freq: Once | ORAL | Status: AC | PRN

## 2014-12-15 MED ORDER — HYDROCODONE-ACETAMINOPHEN 5-325 MG PO TABS: 1.0000 | ORAL_TABLET | ORAL | Status: DC | PRN

## 2014-12-15 MED ORDER — FENTANYL CITRATE 0.05 MG/ML IJ SOLN
INTRAMUSCULAR | Status: DC | PRN
  Administered 2014-12-15: 100 ug via INTRAVENOUS

## 2014-12-15 MED ORDER — SODIUM CHLORIDE 0.9 % IJ SOLN: 3.0000 mL | INTRAMUSCULAR | Status: DC | PRN

## 2014-12-15 MED ORDER — FENTANYL CITRATE 0.05 MG/ML IJ SOLN: 25.0000 ug | INTRAMUSCULAR | Status: DC | PRN

## 2014-12-15 MED ORDER — OFF THE BEAT BOOK
Freq: Once | Status: AC
  Administered 2014-12-15
  Filled 2014-12-15: qty 1

## 2014-12-15 MED ORDER — SODIUM CHLORIDE 0.9 % IJ SOLN: 3.0000 mL | Freq: Two times a day (BID) | INTRAMUSCULAR | Status: DC

## 2014-12-15 NOTE — Progress Notes (Signed)
Site area: right groin a 11fr, 59fr, 70fr venous sheath was removed  Site Prior to Removal:  Level 0  Pressure Applied For 15 MINUTES    Minutes Beginning at 1505  Manual:   Yes.    Patient Status During Pull:  stable  Post Pull Groin Site:  Level 0  Post Pull Instructions Given:  Yes.    Post Pull Pulses Present:  Yes.    Dressing Applied:  Yes.    Comments:  VS remain stable thru sheath pull.  Pt has complaint of soreness at site.

## 2014-12-15 NOTE — Discharge Summary (Signed)
ELECTROPHYSIOLOGY PROCEDURE DISCHARGE SUMMARY    Patient ID: [name],  MRN: [id number], DOB/AGE: December 12, 1941 73 y.o.  Admit date: 12/15/2014 Discharge date: 12/16/2014  Primary Care Physician: [name], MD Primary Cardiologist: [place] Electrophysiologist: Mahari Vankirk  Primary Discharge Diagnosis:  Atrial flutter status post ablation this admission  Secondary Discharge Diagnosis:  1.  Atrial fibrillation 2.  Hyperlipidemia 3.  Diverticulosis 4.  Prior pancreatitis 5.  Tubular adenoma  Allergies  Allergen Reactions  . Meperidine Hcl Rash  . Penicillins Rash    Rash as child     Procedures This Admission: 1.  Electrophysiology study and radiofrequency catheter ablation on 12-15-2014 by Dr Rayann Heman.  This study demonstrated typical atrial flutter with successful CTI ablation.  There were no inducible arrhythmias following ablation and no early apparent complications.   Brief HPI: [name] is a 73 y.o. female with a past medical history as outlined above.  She has documented atrial flutter as well as atrial fibrillation.  She feels that the majority of her symptoms are related to atrial flutter and that her atrial fibrillation is well tolerated. She has been appropriately anticoagulated for >4 weeks.  Risks, benefits, and alternatives to ablation were reviewed with the patient who wished to proceed.   Hospital Course:  The patient was admitted and underwent EPS/RFCA with details as outlined above. She was monitored on telemetry overnight which demonstrated sinus rhythm.  Groin incision was without complication.  They were examined by Dr Rayann Heman who considered them stable for discharge to home.  Follow up will be arranged in 4 weeks.  Wound care and restrictions were reviewed with the patient prior to discharge.   Discharge Vitals: Blood pressure 128/63, pulse 68, temperature 97.8 F (36.6 C), temperature source Oral, resp. rate 20, height 5\' 6"  (1.676 m),  weight 240 lb 4.8 oz (109 kg), SpO2 98 %.   Physical Exam: Filed Vitals:   12/15/14 1942 12/15/14 2000 12/16/14 0026 12/16/14 0557  BP: 102/56 100/54 96/61 128/63  Pulse: 67  63 68  Temp: 98 F (36.7 C)  97.6 F (36.4 C) 97.8 F (36.6 C)  TempSrc: Oral  Oral Oral  Resp: 16 18 18 20   Height:      Weight:   240 lb 4.8 oz (109 kg)   SpO2: 98% 98% 97% 98%    GEN- The patient is well appearing, alert and oriented x 3 today.   Head- normocephalic, atraumatic Eyes-  Sclera clear, conjunctiva pink Ears- hearing intact Oropharynx- clear Neck- supple, Lungs- Clear to ausculation bilaterally, normal work of breathing Heart- Regular rate and rhythm, no murmurs, rubs or gallops, PMI not laterally displaced GI- soft, NT, ND, + BS Extremities- no clubbing, cyanosis, or edema, groin is without hematoma/ bruit MS- no significant deformity or atrophy Skin- no rash or lesion Psych- euthymic mood, full affect Neuro- strength and sensation are intact   Labs:   Lab Results  Component Value Date   WBC 6.5 12/08/2014   HGB 12.6 12/08/2014   HCT 38.5 12/08/2014   MCV 91.3 12/08/2014   PLT 301.0 12/08/2014   No results for input(s): NA, K, CL, CO2, BUN, CREATININE, CALCIUM, PROT, BILITOT, ALKPHOS, ALT, AST, GLUCOSE in the last 168 hours.  Invalid input(s): LABALBU  Discharge Medications:    Medication List    STOP taking these medications        diltiazem 360 MG 24 hr capsule  Commonly known as:  CARDIZEM CD     metoprolol  tartrate 25 MG tablet  Commonly known as:  LOPRESSOR      TAKE these medications        levothyroxine 50 MCG tablet  Commonly known as:  SYNTHROID, LEVOTHROID  TAKE (1/2) TABLET DAILY.     rivaroxaban 20 MG Tabs tablet  Commonly known as:  XARELTO  Take 20 mg by mouth daily with supper.        Disposition:     Duration of Discharge Encounter: Greater than 30 minutes including physician time.  Signed,   Thompson Grayer MD

## 2014-12-15 NOTE — Anesthesia Preprocedure Evaluation (Addendum)
Anesthesia Evaluation  Patient identified by MRN, date of birth, ID band Patient awake    Reviewed: Allergy & Precautions, H&P , NPO status , Patient's Chart, lab work & pertinent test results  Airway Mallampati: II  TM Distance: >3 FB Neck ROM: Full    Dental  (+) Teeth Intact, Dental Advisory Given   Pulmonary  breath sounds clear to auscultation        Cardiovascular Rhythm:Irregular Rate:Normal     Neuro/Psych    GI/Hepatic   Endo/Other  Hypothyroidism   Renal/GU      Musculoskeletal  (+) Arthritis -,   Abdominal (+) + obese,   Peds  Hematology   Anesthesia Other Findings   Reproductive/Obstetrics                            Anesthesia Physical Anesthesia Plan  ASA: III  Anesthesia Plan: General   Post-op Pain Management:    Induction: Intravenous  Airway Management Planned: LMA  Additional Equipment:   Intra-op Plan:   Post-operative Plan:   Informed Consent: I have reviewed the patients History and Physical, chart, labs and discussed the procedure including the risks, benefits and alternatives for the proposed anesthesia with the patient or authorized representative who has indicated his/her understanding and acceptance.   Dental advisory given  Plan Discussed with: CRNA and Anesthesiologist  Anesthesia Plan Comments:         Anesthesia Quick Evaluation

## 2014-12-15 NOTE — Transfer of Care (Signed)
Immediate Anesthesia Transfer of Care Note  Patient: [name]  Procedure(s) Performed: Procedure(s): ATRIAL FLUTTER ABLATION (N/A)  Patient Location: Endoscopy Unit  Anesthesia Type:General  Level of Consciousness: awake, alert  and oriented  Airway & Oxygen Therapy: Patient Spontanous Breathing and Patient connected to nasal cannula oxygen  Post-op Assessment: Report given to PACU RN  Post vital signs: Reviewed and stable  Complications: No apparent anesthesia complications

## 2014-12-15 NOTE — H&P (View-Only) (Signed)
Primary Care Physician: [name], MD Referring Physician: Dr. Leandra Kern is a 73 y.o. female here today for EP evaluation of recent aflutter with rvr. Has history of being  admitted in 11/13 with acute bilateral PE, rapid AF and heart failure after a long plane trip. .EF 40-45%.  On 10/28/12 Unsuccessful DCCV . Loaded amio and underwent repeat DC-CV on 11/13. Maintained on Xarelto. Ace held to reduce hypotension.   In 11/13 had AFL. Amio increased to 400 bid. On 12/10/12 successful DC-CV. Amiodarone was eventually stopped.   04/22/13 ECHO EF 65%, mild LVH, normal RV size and systolic function, PA systolic pressure 35 mmHg .  Had recurrent AF in 9/15. Amiodarone was restarted and she was cardioverted to NSR on 09/14/14. Seen again on Oct 16. Asymptomatic. Was back in AFL with RVR at 150. Underwent DC-CV but reverted to AFL.   Rate control strategy was then pursued.. 11/02/14, she was in AFL at 117. Metoprolol 25 bid added to Diltiazem 360 daily. 48 hour holter placed to look at burden of AFL vs AF and overall rate. On Fit Bit HR ranging 70-115. Continues to feel well No palpitations. No chest pain. No exertional dyspnea or fatigue. She has been on Xarelto long-term without missing any doses.   Today, she denies symptoms of palpitations, chest pain, shortness of breath, orthopnea, PND, lower extremity edema, dizziness, presyncope, syncope, or neurologic sequela.  Exercises in water aerobics 5 days a week and is attending weight watchers.  Overall she states she feels great, but understands risk of long term  tachycardia on function of heart.The patient is tolerating medications without difficulties and is otherwise without complaint today.   Past Medical History  Diagnosis Date  . Hyperlipidemia   . Fasting hyperglycemia     A1C  within normal limits  . Diverticulosis 2002    Dr. Fuller Plan  . Personal history of colonic polyps 2002    tubular adenoma  .  Tortuous colon   . Vitamin D deficiency   . Pancreatitis due to common bile duct stone   . Other abnormal glucose 2011    102   Past Surgical History  Procedure Laterality Date  . Knee surgery       X 5 post accident, right  . Total knee arthroplasty  1997    Dr.Wainer,Right  . Cholecystectomy  1997    Pancreatitis secondary to stone  . Total knee arthroplasty  2009    Dr.Wainer, Left  . Tonsillectomy    . Colonoscopy  2013    negative  . Colonoscopy  2002    adenomatous polyp; Dr Fuller Plan  . Tee without cardioversion  10/28/2012    Procedure: TRANSESOPHAGEAL ECHOCARDIOGRAM (TEE);  Surgeon: Jolaine Artist, MD;  Location: [place] [facility];  Service: Cardiovascular;  Laterality: N/A;  . Cardioversion  10/28/2012    Procedure: CARDIOVERSION;  Surgeon: Jolaine Artist, MD;  Location: [facility] ENDOSCOPY;  Service: Cardiovascular;  Laterality: N/A;  . Cardioversion  11/06/2012    Procedure: CARDIOVERSION;  Surgeon: Jolaine Artist, MD;  Location: [facility] ENDOSCOPY;  Service: Cardiovascular;  Laterality: N/A;  . Cardioversion  12/10/2012    Procedure: CARDIOVERSION;  Surgeon: Jolaine Artist, MD;  Location: [facility] ENDOSCOPY;  Service: Cardiovascular;  Laterality: N/A;  . Cardioversion N/A 09/14/2014    Procedure: CARDIOVERSION;  Surgeon: Jolaine Artist, MD;  Location: [facility] ENDOSCOPY;  Service: Cardiovascular;  Laterality: N/A;  . Cardioversion N/A 10/09/2014    Procedure: CARDIOVERSION;  Surgeon: Sanda Klein, MD;  Location: [facility] ENDOSCOPY;  Service: Cardiovascular;  Laterality: N/A;    Current Outpatient Prescriptions  Medication Sig Dispense Refill  . diltiazem (CARDIZEM CD) 360 MG 24 hr capsule Take 1 capsule (360 mg total) by mouth daily. 30 capsule 3  . levothyroxine (SYNTHROID, LEVOTHROID) 50 MCG tablet TAKE (1/2) TABLET DAILY. (Patient taking differently: TAKE (1/2) TABLET BY MOUTH DAILY.) 45 tablet 1  . metoprolol tartrate (LOPRESSOR) 25 MG tablet Take 1 tablet (25 mg total) by mouth 2  (two) times daily. 60 tablet 3  . rivaroxaban (XARELTO) 20 MG TABS tablet Take 20 mg by mouth daily with supper.     No current facility-administered medications for this visit.    Allergies  Allergen Reactions  . Meperidine Hcl Rash  . Penicillins Rash    Rash as child    History   Social History  . Marital Status: Married    Spouse Name: N/A    Number of Children: 4  . Years of Education: N/A   Occupational History  . retired     Therapist, sports   Social History Main Topics  . Smoking status: Never Smoker   . Smokeless tobacco: Never Used  . Alcohol Use: 0.0 oz/week     Comment:  1 drink /week or less  . Drug Use: No  . Sexual Activity: No   Other Topics Concern  . Not on file   Social History Narrative    Family History  Problem Relation Age of Onset  . Cancer Father     Oral, smoker  . Hypertension Mother   . Stroke Mother 58  . Lung cancer Maternal Uncle     smoker  . Cancer Brother     CNS Cancer  . Colon cancer Brother     late 74's  . Alcohol abuse Brother   . Diabetes Neg Hx   . Clotting disorder Neg Hx     ROS- All systems are reviewed and negative except as per the HPI above  Physical Exam: Filed Vitals:   11/18/14 1118  BP: 126/88  Pulse: 118  Height: 5\' 6"  (1.676 m)  Weight: 239 lb 1.9 oz (108.464 kg)    GEN- The patient is well appearing, alert and oriented x 3 today.   Head- normocephalic, atraumatic Eyes-  Sclera clear, conjunctiva pink Ears- hearing intact Oropharynx- clear Neck- supple, no JVP Lymph- no cervical lymphadenopathy Lungs- Clear to ausculation bilaterally, normal work of breathing Heart-Rapid, Irregular rate and rhythm, no murmurs, rubs or gallops, PMI not laterally displaced GI- soft, NT, ND, + BS Extremities- no clubbing, cyanosis, or edema MS- no significant deformity or atrophy Skin- no rash or lesion Psych- euthymic mood, full affect Neuro- strength and sensation are intact  EKG- Typical aflutter at 118 bpm.RBBB,  Twave abnomality.  Recent monitor reviewed which revealed aflutter/afib.  Echo-Left ventricle: Diastolic function measuremens are nondiagnostic. The cavity size was normal. Wall thickness was increased in a pattern of mild LVH. The estimated ejection fraction was 65%. Wall motion was normal; there were no regional wall motion abnormalities. - Right ventricle: The cavity size was normal. Systolic function was normal. - Pulmonary arteries: PA peak pressure: 51mm Hg (S). - Systemic veins: Mild dilitation of the IVC with partial collapse suggests mild increase in CVP.  Monitor reviewed, showing afib/flutter with rates 68-120 bpm.    Assessment and Plan:  1. Typical aflutter with RVR but with h/o afib  Patient is currently in aflutter and asymptomatic but  is concerned re long term  tachycardia on function of heart.  She has very difficult to control rates with atrial flutter.  V rates with AF seem better controlled.  Today, I discussed treatment options for both afib and atrial flutter.  I offered ablation for both.  She is interested in pursuing an aflutter ablation only-->  not afib ablation. She feels like she could live in afib with easier rarte control after an atrial flutter ablation.   Risk, benefits, and alternatives to EP study and radiofrequency ablation were discussed in detail today. These risks include but are not limited to stroke, bleeding, vascular damage, tamponade, perforation, damage to the heart and other structures, AV block requiring pacemaker, worsening renal function, and death. The patient understands these risk and wishes to proceed.  We will therefore proceed with catheter ablation for atrial flutter at the next available time.  Continue xarelto which pt has been on since 2013 without interruption.

## 2014-12-15 NOTE — Interval H&P Note (Signed)
History and Physical Interval Note:  12/15/2014 11:35 AM  [name]  has presented today for surgery, with the diagnosis of atrial flutter  The various methods of treatment have been discussed with the patient and family. After consideration of risks, benefits and other options for treatment, the patient has consented to  Procedure(s): ATRIAL FLUTTER ABLATION (N/A) as a surgical intervention .  The patient's history has been reviewed, patient examined, no change in status, stable for surgery.  I have reviewed the patient's chart and labs.  Questions were answered to the patient's satisfaction.     Thompson Grayer

## 2014-12-15 NOTE — Anesthesia Procedure Notes (Signed)
Procedure Name: LMA Insertion Date/Time: 12/15/2014 12:49 PM Performed by: Sampson Si E Pre-anesthesia Checklist: Patient identified, Emergency Drugs available, Suction available, Patient being monitored and Timeout performed Patient Re-evaluated:Patient Re-evaluated prior to inductionOxygen Delivery Method: Circle system utilized Preoxygenation: Pre-oxygenation with 100% oxygen Intubation Type: IV induction Ventilation: Mask ventilation without difficulty and Two handed mask ventilation required LMA: LMA inserted LMA Size: 4.0 Number of attempts: 1 Placement Confirmation: positive ETCO2 and breath sounds checked- equal and bilateral Tube secured with: Tape Dental Injury: Teeth and Oropharynx as per pre-operative assessment

## 2014-12-15 NOTE — Op Note (Signed)
PREPROCEDURE DIAGNOSIS: Typical-appearing atrial flutter.   POSTPROCEDURE DIAGNOSIS: Isthmus-dependent counter clockwise right atrial flutter.   PROCEDURES:  1. Comprehensive EP study.  2. Coronary sinus pacing and recording.  3. Mapping of SVT.  4. Ablation of SVT.   INTRODUCTION:  [name] is a 73 y.o. female with a history of symptomatic typical-appearing atrial flutter. She presents today for EP study and radiofrequency ablation.   DESCRIPTION OF THE PROCEDURE: Informed written consent was obtained, and the patient was brought to the electrophysiology lab in the fasting state.  She reports compliance with xarelto, without interruption for > 4 weeks. The patient was then adequately sedated with anesthesia as outlined in the anesthesia report. The patient's right groin was prepped and draped in the usual sterile fashion by the EP lab staff. Using a percutaneous Seldinger technique a 6, 7, and 8-French  hemostasis sheaths were placed into the right common femoral vein. A 7- [employer] decapolar coronary sinus catheter was introduced through the right common femoral vein and advanced into the coronary sinus for recording and pacing from this location. A 6-French quadripolar Josephson catheter was introduced through the right common femoral vein and advanced into the right ventricle for recording and pacing. This catheter was then pulled back to the His bundle location. The patient presented to the electrophysiology lab in atrial flutter. The surface electrocardiogram was consistent with typical atrial flutter. The coronary sinus activation sequence was proximal to distal and suggestive of right atrial flutter.  The atrial flutter cycle length was 260 msec. Atrial entrainment mapping was then performed. When pacing from the left atrium, a long post pacing interval was observed. With entrainment mapping from the cavotricuspid isthmus, the post pacing interval was equal to the  tachycardia cycle length and therefore suggestive of isthmus-dependent right atrial flutter. The patient's QRS duration measured 92 msec with an RR interval of 511 msec and an HV interval of 47 msec.  A 7 F Duodecapolar Halo catheter was introduced through the right common femoral vein and advanced into the right atrium.  The catheter was positioned around the tricuspid valve annulus.  This demonstrated counter clockwise annular reentry around the tricuspid valve.  I elected to perform cavotricuspid isthmus ablation today.  A [employer] 7-French  60mm ablation catheter was introduced through the right common femoral vein and advanced into the right atrium. Mapping of the cavotricuspid isthmus was performed which revealed a standard isthmus. A series of 20 radiofrequency applications were delivered along the cavotricuspid isthmus with a target temperature of 60 degrees with power of 70 watts. During the 2nd radiofrequency ablation lesion, the tachycardia slowed and then terminated. The patient remained in sinus rhythm thereafter. An additional 18 radiofrequency applications were then delivered with similar parameters in order to obtain complete bidirectional cavotricuspid isthmus block. A 3F RAMP sheath was used after the 15th RF lesion in order to improve catheter contact force.    Following ablation, differential atrial pacing was performed from the low lateral right atrium with a Duodecapolar halo catheter in place. This confirmed complete bidirectional cavotricuspid isthmus block with a stimulus to earliest atrial activation recorded bidirectional across the isthmus measuring 160 msec. The patient was observed for 20 minutes without return of conduction through the isthmus.  Following ablation, the AH interval measured 66 msec with an HV interval of 46 msec. Rapid atrial pacing was performed, which revealed an AV Wenckebach cycle length of 380 msec with no evidence of PR greater than RR and no  tachycardias induced when pacing down to a cycle length of 200 msec. Ventricular pacing was then performed which revealed midline concentric VA conduction with a VA WCL of 670 msec.  The procedure was therefore considered completed. All catheters were removed, and the sheaths were aspirated and flushed. The sheaths were removed and hemostasis was assured. EBL<26ml.  A limited bedside echocardiogram revealed no pericardial effusion. There were no early apparent complications.   CONCLUSIONS:  1. Isthmus-dependent counter clockwise right atrial flutter upon presentation, successfully ablated along the usual cavotricuspid isthmus.  2. Complete bidirectional cavotricuspid isthmus block achieved.  3. No inducible arrhythmias following ablation.  4. No early apparent complications.  Jeneen Rinks Callie Bunyard,MD 12/15/2014 2:52 PM

## 2014-12-15 NOTE — Anesthesia Postprocedure Evaluation (Signed)
  Anesthesia Post-op Note  Patient: [name]  Procedure(s) Performed: Procedure(s): ATRIAL FLUTTER ABLATION (N/A)  Patient Location: Cath Lab  Anesthesia Type:General  Level of Consciousness: awake, alert  and oriented  Airway and Oxygen Therapy: Patient Spontanous Breathing and Patient connected to nasal cannula oxygen  Post-op Pain: none  Post-op Assessment: Post-op Vital signs reviewed, Patient's Cardiovascular Status Stable, Respiratory Function Stable, Patent Airway, No signs of Nausea or vomiting and Pain level controlled  Post-op Vital Signs: stable  Last Vitals:  Filed Vitals:   12/15/14 1509  BP: 113/71  Pulse: 69  Temp:   Resp: 13    Complications: No apparent anesthesia complications

## 2014-12-16 DIAGNOSIS — I4891 Unspecified atrial fibrillation: Secondary | ICD-10-CM | POA: Diagnosis not present

## 2014-12-16 DIAGNOSIS — I4892 Unspecified atrial flutter: Secondary | ICD-10-CM | POA: Diagnosis not present

## 2014-12-16 DIAGNOSIS — E669 Obesity, unspecified: Secondary | ICD-10-CM

## 2014-12-16 DIAGNOSIS — E785 Hyperlipidemia, unspecified: Secondary | ICD-10-CM | POA: Diagnosis not present

## 2014-12-16 DIAGNOSIS — K5790 Diverticulosis of intestine, part unspecified, without perforation or abscess without bleeding: Secondary | ICD-10-CM | POA: Diagnosis not present

## 2014-12-16 NOTE — Discharge Instructions (Signed)
No driving for 5 days. No lifting over 5 lbs for 1 week. No sexual activity for 1 week.  Keep procedure site clean & dry. If you notice increased pain, swelling, bleeding or pus, call/return!  You may shower, but no soaking baths/hot tubs/pools for 1 week.  ° ° °

## 2014-12-27 ENCOUNTER — Encounter (HOSPITAL_COMMUNITY): Payer: Self-pay | Admitting: *Deleted

## 2015-01-07 ENCOUNTER — Other Ambulatory Visit: Payer: Self-pay | Admitting: Internal Medicine

## 2015-01-08 ENCOUNTER — Ambulatory Visit (INDEPENDENT_AMBULATORY_CARE_PROVIDER_SITE_OTHER): Payer: Self-pay | Admitting: Internal Medicine

## 2015-01-08 ENCOUNTER — Encounter: Payer: Self-pay | Admitting: Internal Medicine

## 2015-01-08 VITALS — BP 138/82 | HR 56 | Ht 66.0 in | Wt 234.0 lb

## 2015-01-08 DIAGNOSIS — I481 Persistent atrial fibrillation: Secondary | ICD-10-CM

## 2015-01-08 DIAGNOSIS — I4891 Unspecified atrial fibrillation: Secondary | ICD-10-CM | POA: Diagnosis not present

## 2015-01-08 DIAGNOSIS — I4892 Unspecified atrial flutter: Secondary | ICD-10-CM

## 2015-01-08 DIAGNOSIS — I4819 Other persistent atrial fibrillation: Secondary | ICD-10-CM

## 2015-01-08 MED ORDER — RIVAROXABAN 20 MG PO TABS: 20.0000 mg | ORAL_TABLET | Freq: Every day | ORAL | Status: DC

## 2015-01-08 MED ORDER — DILTIAZEM HCL ER BEADS 360 MG PO CP24: 360.0000 mg | ORAL_CAPSULE | Freq: Every day | ORAL | Status: DC
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
## 2015-01-08 NOTE — Patient Instructions (Signed)
Your physician recommends that you schedule a follow-up appointment in: 3 months with Dr. Bensimhon  

## 2015-01-13 ENCOUNTER — Encounter: Payer: Medicare Other | Admitting: Internal Medicine

## 2015-03-02 ENCOUNTER — Other Ambulatory Visit: Payer: Self-pay

## 2015-03-02 DIAGNOSIS — Z1231 Encounter for screening mammogram for malignant neoplasm of breast: Secondary | ICD-10-CM

## 2015-04-05 ENCOUNTER — Ambulatory Visit
Admission: RE | Admit: 2015-04-05 | Discharge: 2015-04-05 | Disposition: A | Discharge status: Home / Self Care | Payer: Medicare Other | Source: Ambulatory Visit

## 2015-04-05 DIAGNOSIS — Z1231 Encounter for screening mammogram for malignant neoplasm of breast: Secondary | ICD-10-CM | POA: Diagnosis not present

## 2015-06-21 ENCOUNTER — Other Ambulatory Visit: Payer: Self-pay

## 2015-07-12 ENCOUNTER — Encounter: Payer: Self-pay | Admitting: Internal Medicine

## 2015-07-12 ENCOUNTER — Ambulatory Visit (INDEPENDENT_AMBULATORY_CARE_PROVIDER_SITE_OTHER): Payer: Medicare Other | Admitting: Internal Medicine

## 2015-07-12 VITALS — BP 136/80 | HR 55 | Temp 97.9°F | Resp 16 | Ht 66.0 in | Wt 202.0 lb

## 2015-07-12 DIAGNOSIS — E785 Hyperlipidemia, unspecified: Secondary | ICD-10-CM | POA: Diagnosis not present

## 2015-07-12 DIAGNOSIS — E559 Vitamin D deficiency, unspecified: Secondary | ICD-10-CM | POA: Diagnosis not present

## 2015-07-12 DIAGNOSIS — E038 Other specified hypothyroidism: Secondary | ICD-10-CM | POA: Diagnosis not present

## 2015-07-12 DIAGNOSIS — Z86010 Personal history of colonic polyps: Secondary | ICD-10-CM | POA: Diagnosis not present

## 2015-07-12 DIAGNOSIS — G479 Sleep disorder, unspecified: Secondary | ICD-10-CM

## 2015-07-12 MED ORDER — ZOLPIDEM TARTRATE 10 MG PO TABS: ORAL_TABLET | ORAL | Status: DC

## 2015-07-12 NOTE — Patient Instructions (Signed)
  Your next office appointment will be determined based upon review of your pending labs .Those written interpretation of the lab results and instructions will be transmitted to you by My Chart  Critical results will be called.   Followup as needed for any active or acute issue. Please report any significant change in your symptoms.   To prevent sleep dysfunction follow these instructions for sleep hygiene. Do not read, watch TV, or eat in bed. Do not get into bed until you are ready to turn off the light &  to go to sleep. Do not ingest stimulants ( decongestants, diet pills, nicotine, caffeine) after the evening meal.Do not take daytime naps.Cardiovascular exercise, this can be as simple a program as walking, is recommended 30-45 minutes 3-4 times per week. If you're not exercising you should take 6-8 weeks to build up to this level.

## 2015-07-12 NOTE — Progress Notes (Signed)
   Subjective:    Patient ID: [name], female    DOB: May 01, 1941, 74 y.o.   MRN: [id number]  HPI The patient is here to assess status of active health conditions.  PMH, FH, & Social History reviewed & updated.  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 She remains on Xarelto because of pulmonary thromboemboli sustained in the Fall of 2013 in the context of international flight.  She has been compliant with her medicines without adverse effects. She is on a heart healthy, low-salt diet. She is in water aerobics 5 times a week and walks 10,000 steps per day without cardiopulmonary symptoms. Blood pressure averages 120/80.  Last colonoscopy was 2013 and was negative. She has a history of adenomatous polyps in 2002. She has no active GI symptoms.  She's never smoked. She will have 2-3 drinks per month.  She continues to have early awakening after going to sleep and is requesting Ambien.   Her last TSH was 6.97 on 10/09/14. At that time her LDL was 151 and HDL 57.6.  Review of Systems  Chest pain, palpitations, tachycardia, exertional dyspnea, paroxysmal nocturnal dyspnea, claudication or edema are absent. No unexplained weight loss, abdominal pain, significant dyspepsia, dysphagia, melena, rectal bleeding, or persistently small caliber stools. Dysuria, pyuria, hematuria, frequency, nocturia or polyuria are denied. Change in hair, skin, nails denied. No bowel changes of constipation or diarrhea. No intolerance to heat or cold.      Objective:   Physical Exam Pertinent or positive findings include: She has decreased hearing bilaterally, greater on the left than the right. 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 She has fusiform changes in the knees with marked crepitus, right greater than left.  General appearance :adequately nourished; in no distress.  Eyes: No conjunctival inflammation or scleral icterus is present.  Oral exam:  Lips and gums  are healthy appearing.There is no oropharyngeal erythema or exudate noted. Dental hygiene is good.  Heart:  Normal rate and regular rhythm. S1 and S2 normal without gallop, murmur, click, rub or other extra sounds    Lungs:Chest clear to auscultation; no wheezes, rhonchi,rales ,or rubs present.No increased work of breathing.   Abdomen: bowel sounds normal, soft and non-tender without masses, organomegaly or hernias noted.  No guarding or rebound.   Vascular : all pulses equal ; no bruits present.  Skin:Warm & dry.  Intact without suspicious lesions or rashes ; no tenting or jaundice   Lymphatic: No lymphadenopathy is noted about the head, neck, axilla.   Neuro: Strength, tone & DTRs normal.         Assessment & Plan:  See Current Assessment & Plan in Problem List under specific Diagnosis

## 2015-07-12 NOTE — Assessment & Plan Note (Signed)
TSH 

## 2015-07-12 NOTE — Assessment & Plan Note (Signed)
Vitamin D level 

## 2015-07-12 NOTE — Progress Notes (Signed)
Pre visit review using our clinic review tool, if applicable. No additional management support is needed unless otherwise documented below in the visit note. 

## 2015-07-12 NOTE — Assessment & Plan Note (Signed)
CBC and differential 

## 2015-07-12 NOTE — Assessment & Plan Note (Signed)
NMR LipoProfile, hepatic panel

## 2015-07-14 ENCOUNTER — Other Ambulatory Visit (INDEPENDENT_AMBULATORY_CARE_PROVIDER_SITE_OTHER): Payer: Medicare Other

## 2015-07-14 ENCOUNTER — Other Ambulatory Visit: Payer: Self-pay | Admitting: Internal Medicine

## 2015-07-14 DIAGNOSIS — E785 Hyperlipidemia, unspecified: Secondary | ICD-10-CM

## 2015-07-14 DIAGNOSIS — E559 Vitamin D deficiency, unspecified: Secondary | ICD-10-CM

## 2015-07-14 DIAGNOSIS — Z86010 Personal history of colonic polyps: Secondary | ICD-10-CM | POA: Diagnosis not present

## 2015-07-14 LAB — HEPATIC FUNCTION PANEL
ALK PHOS: 67 U/L (ref 39–117)
ALT: 13 U/L (ref 0–35)
AST: 18 U/L (ref 0–37)
Albumin: 4.3 g/dL (ref 3.5–5.2)
BILIRUBIN DIRECT: 0.1 mg/dL (ref 0.0–0.3)
Total Bilirubin: 0.5 mg/dL (ref 0.2–1.2)
Total Protein: 7.1 g/dL (ref 6.0–8.3)

## 2015-07-14 LAB — BASIC METABOLIC PANEL
BUN: 18 mg/dL (ref 6–23)
CALCIUM: 9.7 mg/dL (ref 8.4–10.5)
CO2: 29 mEq/L (ref 19–32)
CREATININE: 0.93 mg/dL (ref 0.40–1.20)
Chloride: 106 mEq/L (ref 96–112)
GFR: 62.45 mL/min (ref >60.00)
Glucose, Bld: 99 mg/dL (ref 70–99)
POTASSIUM: 4.3 meq/L (ref 3.5–5.1)
Sodium: 142 mEq/L (ref 135–145)

## 2015-07-14 LAB — CBC WITH DIFFERENTIAL/PLATELET
BASOS ABS: 0 10*3/uL (ref 0.0–0.1)
Basophils Relative: 0.3 % (ref 0.0–3.0)
EOS ABS: 0 10*3/uL (ref 0.0–0.7)
Eosinophils Relative: 0 % (ref 0.0–5.0)
HEMATOCRIT: 38.4 % (ref 36.0–46.0)
Hemoglobin: 12.7 g/dL (ref 12.0–15.0)
LYMPHS PCT: 16.5 % (ref 12.0–46.0)
Lymphs Abs: 0.8 10*3/uL (ref 0.7–4.0)
MCHC: 33.1 g/dL (ref 30.0–36.0)
MCV: 90.8 fl (ref 78.0–100.0)
Monocytes Absolute: 0.3 10*3/uL (ref 0.1–1.0)
Monocytes Relative: 7.1 % (ref 3.0–12.0)
NEUTROS ABS: 3.7 10*3/uL (ref 1.4–7.7)
Neutrophils Relative %: 76.1 % (ref 43.0–77.0)
Platelets: 311 10*3/uL (ref 150.0–400.0)
RBC: 4.22 Mil/uL (ref 3.87–5.11)
RDW: 13.5 % (ref 11.5–15.5)
WBC: 4.8 10*3/uL (ref 4.0–10.5)

## 2015-07-14 LAB — VITAMIN D 25 HYDROXY (VIT D DEFICIENCY, FRACTURES): VITD: 34.3 ng/mL (ref 30.00–100.00)

## 2015-07-14 LAB — TSH: TSH: 6.12 u[IU]/mL — AB (ref 0.35–4.50)

## 2015-07-15 ENCOUNTER — Other Ambulatory Visit: Payer: Self-pay | Admitting: Internal Medicine

## 2015-07-15 DIAGNOSIS — E038 Other specified hypothyroidism: Secondary | ICD-10-CM

## 2015-07-15 MED ORDER — LEVOTHYROXINE SODIUM 50 MCG PO TABS: ORAL_TABLET | ORAL | Status: DC

## 2015-07-16 LAB — NMR LIPOPROFILE WITH LIPIDS
CHOLESTEROL, TOTAL: 213 mg/dL — AB (ref 100–199)
HDL Particle Number: 32.5 umol/L (ref >=30.5)
HDL Size: 10 nm (ref >=9.2)
HDL-C: 69 mg/dL (ref >39)
LARGE HDL: 11.9 umol/L (ref >=4.8)
LDL CALC: 133 mg/dL — AB (ref 0–99)
LDL Particle Number: 1186 nmol/L — ABNORMAL HIGH (ref <1000)
LDL SIZE: 21.3 nm (ref >=20.8)
LP-IR Score: <25 (ref <=45)
Large VLDL-P: 1.4 nmol/L (ref <=2.7)
Small LDL Particle Number: 249 nmol/L (ref <=527)
TRIGLYCERIDES: 56 mg/dL (ref 0–149)
VLDL Size: 40.1 nm (ref <=46.6)

## 2015-08-23 DIAGNOSIS — H43813 Vitreous degeneration, bilateral: Secondary | ICD-10-CM | POA: Diagnosis not present

## 2015-08-23 DIAGNOSIS — H25813 Combined forms of age-related cataract, bilateral: Secondary | ICD-10-CM | POA: Diagnosis not present

## 2015-08-23 DIAGNOSIS — H524 Presbyopia: Secondary | ICD-10-CM | POA: Diagnosis not present

## 2015-10-08 ENCOUNTER — Other Ambulatory Visit: Payer: Self-pay | Admitting: Internal Medicine

## 2015-10-11 ENCOUNTER — Telehealth: Payer: Self-pay | Admitting: Internal Medicine

## 2015-10-11 NOTE — Telephone Encounter (Signed)
Pt request to transfer from Dr. Linna Darner to Dr. [name]. Pt spouse, KELLA SPLINTER, is a pt of Dr. [name] already. Please advise.   Ok to leave detail massage on home phone [phone]

## 2015-10-11 NOTE — Telephone Encounter (Signed)
Ok with me 

## 2015-10-12 NOTE — Telephone Encounter (Signed)
Left detail massage for pt/PCP changed

## 2015-11-10 ENCOUNTER — Ambulatory Visit (INDEPENDENT_AMBULATORY_CARE_PROVIDER_SITE_OTHER): Payer: Medicare Other | Admitting: Internal Medicine

## 2015-11-10 ENCOUNTER — Other Ambulatory Visit (INDEPENDENT_AMBULATORY_CARE_PROVIDER_SITE_OTHER): Payer: Medicare Other

## 2015-11-10 ENCOUNTER — Encounter: Payer: Self-pay | Admitting: Internal Medicine

## 2015-11-10 VITALS — BP 144/88 | HR 65 | Temp 97.5°F | Resp 16 | Ht 66.0 in | Wt 193.0 lb

## 2015-11-10 DIAGNOSIS — I5022 Chronic systolic (congestive) heart failure: Secondary | ICD-10-CM

## 2015-11-10 DIAGNOSIS — I480 Paroxysmal atrial fibrillation: Secondary | ICD-10-CM | POA: Diagnosis not present

## 2015-11-10 DIAGNOSIS — E038 Other specified hypothyroidism: Secondary | ICD-10-CM | POA: Diagnosis not present

## 2015-11-10 DIAGNOSIS — Z23 Encounter for immunization: Secondary | ICD-10-CM | POA: Diagnosis not present

## 2015-11-10 LAB — BASIC METABOLIC PANEL WITH GFR
BUN: 20 mg/dL (ref 6–23)
CO2: 30 meq/L (ref 19–32)
Calcium: 9.6 mg/dL (ref 8.4–10.5)
Chloride: 103 meq/L (ref 96–112)
Creatinine, Ser: 0.79 mg/dL (ref 0.40–1.20)
GFR: 75.32 mL/min
Glucose, Bld: 87 mg/dL (ref 70–99)
Potassium: 3.9 meq/L (ref 3.5–5.1)
Sodium: 141 meq/L (ref 135–145)

## 2015-11-10 LAB — TSH: TSH: 2.38 u[IU]/mL (ref 0.35–4.50)

## 2015-11-10 NOTE — Progress Notes (Signed)
Pre visit review using our clinic review tool, if applicable. No additional management support is needed unless otherwise documented below in the visit note. 

## 2015-11-10 NOTE — Progress Notes (Signed)
Subjective:  Patient ID: [name], female    DOB: [birth year]  Age: 74 y.o. MRN: [id number]  CC: Hypothyroidism   HPI SHONDELL MANDATO presents for a f/up on hypothyroidism - she feels well and offers no complaints.  Outpatient Prescriptions Prior to Visit  Medication Sig Dispense Refill  . diltiazem (TIAZAC) 360 MG 24 hr capsule Take 1 capsule (360 mg total) by mouth daily. 90 capsule 3  . levothyroxine (SYNTHROID, LEVOTHROID) 50 MCG tablet TAKE 1/2 TABLET DAILY, EXCEPT 1 TABLET ON TUESDAY, THURSDAY AND SATURDAY. 64 tablet 0  . rivaroxaban (XARELTO) 20 MG TABS tablet Take 1 tablet (20 mg total) by mouth daily with supper. 90 tablet 3  . zolpidem (AMBIEN) 10 MG tablet 1 every third night as needed for sleep 10 tablet 1   No facility-administered medications prior to visit.    ROS Review of Systems  Constitutional: Negative.  Negative for fever, chills, diaphoresis, appetite change and fatigue.  HENT: Negative.   Eyes: Negative.   Respiratory: Negative.  Negative for cough, choking, chest tightness, shortness of breath and stridor.   Cardiovascular: Negative.  Negative for chest pain, palpitations and leg swelling.  Gastrointestinal: Negative.  Negative for nausea, vomiting, abdominal pain, diarrhea and constipation.  Endocrine: Negative.   Genitourinary: Negative.   Musculoskeletal: Negative.   Skin: Negative.  Negative for rash.  Allergic/Immunologic: Negative.   Neurological: Negative.  Negative for dizziness, tremors, syncope, light-headedness, numbness and headaches.  Hematological: Negative.  Negative for adenopathy. Does not bruise/bleed easily.  Psychiatric/Behavioral: Negative.     Objective:  BP 144/88 mmHg  Pulse 65  Temp(Src) 97.5 F (36.4 C) (Oral)  Resp 16  Ht 5\' 6"  (1.676 m)  Wt 193 lb (87.544 kg)  BMI 31.17 kg/m2  SpO2 97%  BP Readings from Last 3 Encounters:  11/10/15 144/88  07/12/15 136/80  01/08/15 138/82    Wt Readings from Last 3  Encounters:  11/10/15 193 lb (87.544 kg)  07/12/15 202 lb (91.627 kg)  01/08/15 234 lb (106.142 kg)    Physical Exam  Constitutional: She is oriented to person, place, and time. No distress.  HENT:  Mouth/Throat: Oropharynx is clear and moist. No oropharyngeal exudate.  Eyes: Conjunctivae are normal. Right eye exhibits no discharge. Left eye exhibits no discharge. No scleral icterus.  Neck: Normal range of motion. Neck supple. No JVD present. No tracheal deviation present. No thyromegaly present.  Cardiovascular: Normal rate, regular rhythm, normal heart sounds and intact distal pulses.  Exam reveals no gallop and no friction rub.   No murmur heard. Pulmonary/Chest: Effort normal and breath sounds normal. No stridor. No respiratory distress. She has no wheezes. She has no rales. She exhibits no tenderness.  Abdominal: Soft. Bowel sounds are normal. She exhibits no distension and no mass. There is no tenderness. There is no rebound and no guarding.  Musculoskeletal: Normal range of motion. She exhibits no edema or tenderness.  Lymphadenopathy:    She has no cervical adenopathy.  Neurological: She is oriented to person, place, and time.  Skin: Skin is warm and dry. No rash noted. She is not diaphoretic. No erythema. No pallor.  Vitals reviewed.   Lab Results  Component Value Date   WBC 4.8 07/14/2015   HGB 12.7 07/14/2015   HCT 38.4 07/14/2015   PLT 311.0 07/14/2015   GLUCOSE 87 11/10/2015   CHOL 213* 07/14/2015   TRIG 56 07/14/2015   HDL 69 07/14/2015   LDLDIRECT 127.1 07/08/2013   LDLCALC  133* 07/14/2015   ALT 13 07/14/2015   AST 18 07/14/2015   NA 141 11/10/2015   K 3.9 11/10/2015   CL 103 11/10/2015   CREATININE 0.79 11/10/2015   BUN 20 11/10/2015   CO2 30 11/10/2015   TSH 2.38 11/10/2015   INR 2.79* 10/09/2014   HGBA1C 5.5 03/13/2008   MICROALBUR 0.8 12/03/2006    Mm Screening Breast Tomo Bilateral  04/05/2015  CLINICAL DATA:  Screening. EXAM: DIGITAL SCREENING  BILATERAL MAMMOGRAM WITH 3D TOMO WITH CAD COMPARISON:  Previous exam(s). ACR Breast Density Category b: There are scattered areas of fibroglandular density. FINDINGS: There are no findings suspicious for malignancy. Images were processed with CAD. IMPRESSION: No mammographic evidence of malignancy. A result letter of this screening mammogram will be mailed directly to the patient. RECOMMENDATION: Screening mammogram in one year. (Code:SM-B-01Y) BI-RADS CATEGORY  1: Negative. Electronically Signed   By: Skipper Cliche M.D.   On: 04/05/2015 16:51    Assessment & Plan:   Jacquenette was seen today for hypothyroidism.  Diagnoses and all orders for this visit:  Other specified hypothyroidism- her TSh is on the normal range, will stay on the current synthroid dose -     TSH; Future  Chronic systolic heart failure ([place])- she has a normal volume status, lytes and renal function are stable -     Basic metabolic panel; Future  Need for vaccination with 13-polyvalent pneumococcal conjugate vaccine -     Pneumococcal conjugate vaccine 13-valent  Paroxysmal atrial fibrillation ([place])- she has good rate and rhythm control,  GFR is >60 so will remain on the current xarelto dose  I am having Ms. Konkle maintain her diltiazem, rivaroxaban, zolpidem, levothyroxine, and diltiazem.  Meds ordered this encounter  Medications  . diltiazem (CARDIZEM CD) 360 MG 24 hr capsule    Sig:      Follow-up: Return in about 6 months (around 05/09/2016).  Scarlette Calico, MD

## 2015-11-10 NOTE — Patient Instructions (Signed)

## 2015-11-11 ENCOUNTER — Encounter: Payer: Self-pay | Admitting: Internal Medicine

## 2016-01-05 ENCOUNTER — Encounter (HOSPITAL_COMMUNITY): Payer: Self-pay | Admitting: Internal Medicine

## 2016-01-05 ENCOUNTER — Ambulatory Visit (HOSPITAL_COMMUNITY)
Admission: RE | Admit: 2016-01-05 | Discharge: 2016-01-05 | Disposition: A | Discharge status: Home / Self Care | Payer: Medicare Other | Source: Ambulatory Visit | Attending: Internal Medicine | Admitting: Internal Medicine

## 2016-01-05 VITALS — BP 126/70 | HR 70 | Wt 194.2 lb

## 2016-01-05 DIAGNOSIS — Z7901 Long term (current) use of anticoagulants: Secondary | ICD-10-CM | POA: Diagnosis not present

## 2016-01-05 DIAGNOSIS — I480 Paroxysmal atrial fibrillation: Secondary | ICD-10-CM | POA: Insufficient documentation

## 2016-01-05 DIAGNOSIS — Z86718 Personal history of other venous thrombosis and embolism: Secondary | ICD-10-CM | POA: Diagnosis not present

## 2016-01-05 DIAGNOSIS — I4892 Unspecified atrial flutter: Secondary | ICD-10-CM

## 2016-01-05 DIAGNOSIS — Z86711 Personal history of pulmonary embolism: Secondary | ICD-10-CM | POA: Insufficient documentation

## 2016-01-05 NOTE — Patient Instructions (Signed)
Follow up 1 year.  Do the following things EVERYDAY: 1) Weigh yourself in the morning before breakfast. Write it down and keep it in a log. 2) Take your medicines as prescribed 3) Eat low salt foods-Limit salt (sodium) to 2000 mg per day.  4) Stay as active as you can everyday 5) Limit all fluids for the day to less than 2 liters  

## 2016-01-05 NOTE — Progress Notes (Signed)
Patient ID: DUANE DEBOISE, female   DOB: 06/21/1941, 75 y.o.   MRN: [id number]    Advanced Heart Failure Clinic Note   HPI:  Ms Kull is a 75 y/o woman with h/o of PAF and AFL s/p AFL ablation.   She was admitted in 11/13 with acute bilateral PE, rapid AF and heart failure after a long plane trip. .EF 40-45%.  On 10/28/12 Unsuccessful DCCV . Loaded amio and underwent repeat DC-CV on 11/13. Maintained on Xarelto. In 11/13 had AFL.  Amio increased to 400 bid. On 12/10/12 successful DC-CV.  Amiodarone was eventually stopped.   04/22/13 ECHO EF 65%, mild LVH, normal RV size and systolic function, PA systolic pressure 35 mmHg .Had recurrent AF in 9/15. Amiodarone was restarted and she was cardioverted to NSR on 09/14/14. Seen again on Oct 16. Asymptomatic. Was back in AFL with RVR at 150. Underwent DC-CV but reverted to AFL. S/p A flutter ablation 12/15/2014. Was seen by Dr Rayann Heman in follow up last 01/08/15 and was stable at that time with no symptoms. She was in NSR at that time. Metoprolol stopped.   She returns today for regular follow, s/p aflutter ablation x 1 year. She has come today per Dr Rayann Heman to see if she can get off of her cardiac medications. They winter in [place] and she just recently got back. Wears Fitbit and HR nearly constant 60-70. Has had no symptoms since ablation. No palpitations or chest pain. Denies SOB/Orthopnea/PND. Does water aerobics 3-5 times a week. On Xarelto without missing any dose.  No bleeding or dark stools.    ECG NSR 67 IVCD. PRWP. No ST-T wave abnormalities.    Labs (7/15): K 4.2, creatinine 0.8  ROS: All systems negative except as listed in HPI, PMH and Problem List.  Past Medical History  Diagnosis Date  . Hyperlipidemia   . Fasting hyperglycemia     A1C  within normal limits  . Diverticulosis 2002    Dr. Fuller Plan  . Personal history of colonic polyps 2002    tubular adenoma  . Tortuous colon   . Vitamin D deficiency   . Pancreatitis due to common  bile duct stone   . Pulmonary embolism ([place]) 2013    "S/P a long airplane ride"  . DVT (deep venous thrombosis) ([place]) 2013    "S/P a long airplane ride"  . Atrial flutter ([place])     a. s/p CTI ablation 11/2014  . Paroxysmal atrial fibrillation [facility])     Current Outpatient Prescriptions  Medication Sig Dispense Refill  . diltiazem (TIAZAC) 360 MG 24 hr capsule Take 1 capsule (360 mg total) by mouth daily. 90 capsule 3  . levothyroxine (SYNTHROID, LEVOTHROID) 50 MCG tablet TAKE 1/2 TABLET DAILY, EXCEPT 1 TABLET ON TUESDAY, THURSDAY AND SATURDAY. 64 tablet 0  . rivaroxaban (XARELTO) 20 MG TABS tablet Take 1 tablet (20 mg total) by mouth daily with supper. 90 tablet 3   No current facility-administered medications for this encounter.   Filed Vitals:   01/05/16 1340  BP: 126/70  Pulse: 70  Weight: 194 lb 4 oz (88.111 kg)  SpO2: 99%   PHYSICAL EXAM: General:  Normal appearing.  HEENT: normal Neck: supple. JVP flat. Carotids 2+ bilaterally; no bruits. No lymphadenopathy or thryomegaly appreciated. Cor: PMI normal. Regular, no rubs, gallops or murmurs. Lungs: clear Abdomen: soft, NT, ND, no HSM. No bruits or masses. +BS  Extremities: no cyanosis, clubbing, rash, edema Neuro: alert & orientedx3, cranial nerves grossly intact. Moves  all 4 extremities w/o difficulty. Affect pleasant.  ECG: NSR 67 bpm, Incomplete Right Bundle Branch Block. PRWP  ASSESSMENT & PLAN:  1) Atrial arrhythmias s/p aflutter ablation. With PAF and PAFL - Has had history of recurrent AFL despite DC-CV (x 2 in 2013 and x 1 in 2015), now, as above s/p AFL ablation by Dr Rayann Heman. - Dr. Haroldine Laws previously reviewed all her ECGs in computer dating back to 2012., that shows long history of both AF and AFL.  - Continue Xarelto 20 mg daily. - In past ? component of OSA, as refused sleep study.  2) Hx of DVT and PEs - s/p long flights in 2015 - Recommend that she remain on Xarelto.    Shirley Friar  PA-C 2:01 PM 01/05/2016   Patient seen and examined with [name], PA-C. We discussed all aspects of the encounter. I agree with the assessment and plan as stated above.   Doing great. No evidence of AF/AFL. Will continue Xarelto. Can stop diltiazem. RTC in 1 year.   Dolores Mcgovern,MD 11:46 PM

## 2016-01-06 ENCOUNTER — Other Ambulatory Visit: Payer: Self-pay | Admitting: Internal Medicine

## 2016-03-16 ENCOUNTER — Other Ambulatory Visit: Payer: Self-pay

## 2016-03-16 DIAGNOSIS — Z1231 Encounter for screening mammogram for malignant neoplasm of breast: Secondary | ICD-10-CM

## 2016-04-05 ENCOUNTER — Other Ambulatory Visit: Payer: Self-pay | Admitting: Internal Medicine

## 2016-04-05 ENCOUNTER — Ambulatory Visit: Payer: Medicare Other

## 2016-04-06 ENCOUNTER — Ambulatory Visit
Admission: RE | Admit: 2016-04-06 | Discharge: 2016-04-06 | Disposition: A | Discharge status: Home / Self Care | Payer: Medicare Other | Source: Ambulatory Visit

## 2016-04-06 DIAGNOSIS — Z1231 Encounter for screening mammogram for malignant neoplasm of breast: Secondary | ICD-10-CM

## 2016-04-06 LAB — HM MAMMOGRAPHY

## 2016-05-10 ENCOUNTER — Telehealth: Payer: Self-pay | Admitting: Internal Medicine

## 2016-05-10 NOTE — Telephone Encounter (Signed)
Pt called in and has several questions.  Honestly not really sure what she was asking.  Can you give her a call when you get a chance.    She will be home till 11am and then after 2pm today

## 2016-05-11 NOTE — Telephone Encounter (Signed)
She just wants to know if she has to come in for an appt to get order put in to check her thyriod levels or if Dr [name] can just put order in?

## 2016-05-11 NOTE — Telephone Encounter (Signed)
lmovm and with husband to call office back

## 2016-05-12 NOTE — Telephone Encounter (Signed)
Pt is due for her 6 mo f/u her TSH will be checked then please get on shedule you can make a notation for tsh in appt note as well

## 2016-05-12 NOTE — Telephone Encounter (Signed)
Noted thanks °

## 2016-05-12 NOTE — Telephone Encounter (Signed)
Got scheduled  °

## 2016-05-16 ENCOUNTER — Encounter: Payer: Self-pay | Admitting: Internal Medicine

## 2016-05-16 ENCOUNTER — Other Ambulatory Visit (INDEPENDENT_AMBULATORY_CARE_PROVIDER_SITE_OTHER): Payer: Medicare Other

## 2016-05-16 ENCOUNTER — Ambulatory Visit (INDEPENDENT_AMBULATORY_CARE_PROVIDER_SITE_OTHER): Payer: Medicare Other | Admitting: Internal Medicine

## 2016-05-16 VITALS — BP 122/82 | HR 78 | Temp 97.7°F | Ht 66.0 in | Wt 187.0 lb

## 2016-05-16 DIAGNOSIS — E785 Hyperlipidemia, unspecified: Secondary | ICD-10-CM | POA: Diagnosis not present

## 2016-05-16 DIAGNOSIS — E038 Other specified hypothyroidism: Secondary | ICD-10-CM | POA: Diagnosis not present

## 2016-05-16 DIAGNOSIS — I480 Paroxysmal atrial fibrillation: Secondary | ICD-10-CM

## 2016-05-16 LAB — BASIC METABOLIC PANEL
BUN: 25 mg/dL — AB (ref 6–23)
CHLORIDE: 107 meq/L (ref 96–112)
CO2: 31 meq/L (ref 19–32)
CREATININE: 0.88 mg/dL (ref 0.40–1.20)
Calcium: 9.7 mg/dL (ref 8.4–10.5)
GFR: 66.41 mL/min (ref >60.00)
Glucose, Bld: 75 mg/dL (ref 70–99)
Potassium: 4.2 mEq/L (ref 3.5–5.1)
Sodium: 143 mEq/L (ref 135–145)

## 2016-05-16 LAB — CBC WITH DIFFERENTIAL/PLATELET
BASOS PCT: 0.2 % (ref 0.0–3.0)
Basophils Absolute: 0 10*3/uL (ref 0.0–0.1)
EOS ABS: 0.2 10*3/uL (ref 0.0–0.7)
Eosinophils Relative: 2.5 % (ref 0.0–5.0)
HEMATOCRIT: 37.3 % (ref 36.0–46.0)
Hemoglobin: 12.3 g/dL (ref 12.0–15.0)
LYMPHS ABS: 1 10*3/uL (ref 0.7–4.0)
Lymphocytes Relative: 15.1 % (ref 12.0–46.0)
MCHC: 33 g/dL (ref 30.0–36.0)
MCV: 90.3 fl (ref 78.0–100.0)
MONO ABS: 0.5 10*3/uL (ref 0.1–1.0)
Monocytes Relative: 7.7 % (ref 3.0–12.0)
NEUTROS ABS: 4.8 10*3/uL (ref 1.4–7.7)
Neutrophils Relative %: 74.5 % (ref 43.0–77.0)
PLATELETS: 294 10*3/uL (ref 150.0–400.0)
RBC: 4.14 Mil/uL (ref 3.87–5.11)
RDW: 13.8 % (ref 11.5–15.5)
WBC: 6.4 10*3/uL (ref 4.0–10.5)

## 2016-05-16 LAB — LIPID PANEL
CHOL/HDL RATIO: 3
Cholesterol: 192 mg/dL (ref 0–200)
HDL: 61.8 mg/dL (ref >39.00)
LDL Cholesterol: 118 mg/dL — ABNORMAL HIGH (ref 0–99)
NONHDL: 130.25
Triglycerides: 62 mg/dL (ref 0.0–149.0)
VLDL: 12.4 mg/dL (ref 0.0–40.0)

## 2016-05-16 LAB — TSH: TSH: 0.4 u[IU]/mL (ref 0.35–4.50)

## 2016-05-16 MED ORDER — LEVOTHYROXINE SODIUM 50 MCG PO TABS: ORAL_TABLET | ORAL | Status: DC

## 2016-05-16 NOTE — Progress Notes (Signed)
Subjective:  Patient ID: [name], female    DOB: June 15, 1941  Age: 75 y.o. MRN: [id number]  CC: Hypothyroidism; Hyperlipidemia; and Atrial Fibrillation   HPI [name] presents for follow-up on the above listed medical problems. She feels well today and offers no complaints. She reports no palpitations, chest pain, shortness of breath, dyspnea on exertion, near syncope, fatigue, or edema.  Outpatient Prescriptions Prior to Visit  Medication Sig Dispense Refill  . XARELTO 20 MG TABS tablet TAKE 1 TABLET ONCE DAILY. 90 tablet 3  . levothyroxine (SYNTHROID, LEVOTHROID) 50 MCG tablet TAKE 1/2 TABLET DAILY, EXCEPT 1 TABLET ON TUESDAY, THURSDAY AND SATURDAY. 64 tablet 5  . diltiazem (TIAZAC) 360 MG 24 hr capsule Take 1 capsule (360 mg total) by mouth daily. (Patient not taking: Reported on 05/16/2016) 90 capsule 3   No facility-administered medications prior to visit.    ROS Review of Systems  Constitutional: Negative.  Negative for diaphoresis and fatigue.  HENT: Negative.   Eyes: Negative.   Respiratory: Negative.  Negative for cough, choking, chest tightness, shortness of breath and stridor.   Cardiovascular: Negative.  Negative for chest pain, palpitations and leg swelling.  Gastrointestinal: Negative.  Negative for nausea, vomiting, abdominal pain, diarrhea and constipation.  Endocrine: Negative.   Genitourinary: Negative.  Negative for urgency, decreased urine volume, enuresis and difficulty urinating.  Musculoskeletal: Negative.  Negative for myalgias, back pain, joint swelling and arthralgias.  Skin: Negative.  Negative for color change and rash.  Allergic/Immunologic: Negative.   Neurological: Negative.  Negative for dizziness, tremors, weakness, light-headedness and numbness.  Hematological: Negative.  Negative for adenopathy. Does not bruise/bleed easily.  Psychiatric/Behavioral: Negative.     Objective:  BP 122/82 mmHg  Pulse 78  Temp(Src) 97.7 F (36.5 C)   Ht 5\' 6"  (1.676 m)  Wt 187 lb (84.823 kg)  BMI 30.20 kg/m2  SpO2 98%  BP Readings from Last 3 Encounters:  05/16/16 122/82  01/05/16 126/70  11/10/15 144/88    Wt Readings from Last 3 Encounters:  05/16/16 187 lb (84.823 kg)  01/05/16 194 lb 4 oz (88.111 kg)  11/10/15 193 lb (87.544 kg)    Physical Exam  Constitutional: She is oriented to person, place, and time. No distress.  HENT:  Mouth/Throat: Oropharynx is clear and moist. No oropharyngeal exudate.  Eyes: Conjunctivae are normal. Right eye exhibits no discharge. Left eye exhibits no discharge. No scleral icterus.  Neck: Normal range of motion. Neck supple. No JVD present. No tracheal deviation present. No thyromegaly present.  Cardiovascular: Normal rate, regular rhythm, normal heart sounds and intact distal pulses.  Exam reveals no gallop and no friction rub.   No murmur heard. Pulmonary/Chest: Effort normal and breath sounds normal. No stridor. No respiratory distress. She has no wheezes. She has no rales. She exhibits no tenderness.  Abdominal: Soft. Bowel sounds are normal. She exhibits no distension and no mass. There is no tenderness. There is no rebound and no guarding.  Musculoskeletal: Normal range of motion. She exhibits no edema or tenderness.  Lymphadenopathy:    She has no cervical adenopathy.  Neurological: She is oriented to person, place, and time.  Skin: Skin is warm and dry. No rash noted. She is not diaphoretic. No erythema. No pallor.  Vitals reviewed.   Lab Results  Component Value Date   WBC 6.4 05/16/2016   HGB 12.3 05/16/2016   HCT 37.3 05/16/2016   PLT 294.0 05/16/2016   GLUCOSE 75 05/16/2016   CHOL  192 05/16/2016   TRIG 62.0 05/16/2016   HDL 61.80 05/16/2016   LDLDIRECT 127.1 07/08/2013   LDLCALC 118* 05/16/2016   ALT 13 07/14/2015   AST 18 07/14/2015   NA 143 05/16/2016   K 4.2 05/16/2016   CL 107 05/16/2016   CREATININE 0.88 05/16/2016   BUN 25* 05/16/2016   CO2 31 05/16/2016    TSH 0.40 05/16/2016   INR 2.79* 10/09/2014   HGBA1C 5.5 03/13/2008   MICROALBUR 0.8 12/03/2006    Mm Screening Breast Tomo Bilateral  04/07/2016  CLINICAL DATA:  Screening. EXAM: 2D DIGITAL SCREENING BILATERAL MAMMOGRAM WITH CAD AND ADJUNCT TOMO COMPARISON:  Previous exam(s). ACR Breast Density Category b: There are scattered areas of fibroglandular density. FINDINGS: There are no findings suspicious for malignancy. Images were processed with CAD. IMPRESSION: No mammographic evidence of malignancy. A result letter of this screening mammogram will be mailed directly to the patient. RECOMMENDATION: Screening mammogram in one year. (Code:SM-B-01Y) BI-RADS CATEGORY  1: Negative. Electronically Signed   By: Dorise Bullion III M.D   On: 04/07/2016 08:15    Assessment & Plan:   Norrene was seen today for hypothyroidism, hyperlipidemia and atrial fibrillation.  Diagnoses and all orders for this visit:  Other specified hypothyroidism- her TSH is in the normal range, she will remain on the current dose of levothyroxine. -     CBC with Differential/Platelet; Future -     Lipid panel; Future -     TSH; Future -     levothyroxine (SYNTHROID, LEVOTHROID) 50 MCG tablet; TAKE 1/2 TABLET DAILY, EXCEPT 1 TABLET ON TUESDAY, THURSDAY AND SATURDAY.  Hyperlipidemia- her Framingham risk score is only 5% so I do not recommend that she start a statin. -     CBC with Differential/Platelet; Future -     Lipid panel; Future -     TSH; Future  Paroxysmal atrial fibrillation ([place])- She has good rate and rhythm control, will continue the calcium channel blocker and NOAC -     Basic metabolic panel; Future -     CBC with Differential/Platelet; Future   I am having Ms. Wagenaar maintain her diltiazem, XARELTO, and levothyroxine.  Meds ordered this encounter  Medications  . levothyroxine (SYNTHROID, LEVOTHROID) 50 MCG tablet    Sig: TAKE 1/2 TABLET DAILY, EXCEPT 1 TABLET ON TUESDAY, THURSDAY AND SATURDAY.     Dispense:  64 tablet    Refill:  5     Follow-up: Return in about 6 months (around 11/16/2016).  Scarlette Calico, MD

## 2016-05-16 NOTE — Progress Notes (Signed)
Pre visit review using our clinic review tool, if applicable. No additional management support is needed unless otherwise documented below in the visit note. 

## 2016-05-16 NOTE — Patient Instructions (Signed)

## 2016-05-17 ENCOUNTER — Encounter: Payer: Self-pay | Admitting: Internal Medicine

## 2016-07-13 ENCOUNTER — Ambulatory Visit (INDEPENDENT_AMBULATORY_CARE_PROVIDER_SITE_OTHER): Payer: Medicare Other | Admitting: Internal Medicine

## 2016-07-13 ENCOUNTER — Encounter: Payer: Self-pay | Admitting: Internal Medicine

## 2016-07-13 VITALS — BP 122/78 | HR 98 | Temp 97.9°F | Ht 66.0 in | Wt 181.2 lb

## 2016-07-13 DIAGNOSIS — N3281 Overactive bladder: Secondary | ICD-10-CM | POA: Insufficient documentation

## 2016-07-13 DIAGNOSIS — I482 Chronic atrial fibrillation: Secondary | ICD-10-CM | POA: Diagnosis not present

## 2016-07-13 DIAGNOSIS — Z0000 Encounter for general adult medical examination without abnormal findings: Secondary | ICD-10-CM | POA: Diagnosis not present

## 2016-07-13 DIAGNOSIS — E038 Other specified hypothyroidism: Secondary | ICD-10-CM | POA: Diagnosis not present

## 2016-07-13 DIAGNOSIS — E669 Obesity, unspecified: Secondary | ICD-10-CM

## 2016-07-13 DIAGNOSIS — E2839 Other primary ovarian failure: Secondary | ICD-10-CM | POA: Diagnosis not present

## 2016-07-13 DIAGNOSIS — I4821 Permanent atrial fibrillation: Secondary | ICD-10-CM

## 2016-07-13 MED ORDER — MIRABEGRON ER 50 MG PO TB24: 50.0000 mg | ORAL_TABLET | Freq: Every day | ORAL | Status: DC

## 2016-07-13 MED ORDER — LEVOTHYROXINE SODIUM 50 MCG PO TABS: ORAL_TABLET | ORAL | Status: DC

## 2016-07-13 NOTE — Patient Instructions (Signed)
Preventive Care for Adults, Female A healthy lifestyle and preventive care can promote health and wellness. Preventive health guidelines for women include the following key practices.  A routine yearly physical is a good way to check with your health care provider about your health and preventive screening. It is a chance to share any concerns and updates on your health and to receive a thorough exam.  Visit your dentist for a routine exam and preventive care every 6 months. Brush your teeth twice a day and floss once a day. Good oral hygiene prevents tooth decay and gum disease.  The frequency of eye exams is based on your age, health, family medical history, use of contact lenses, and other factors. Follow your health care provider's recommendations for frequency of eye exams.  Eat a healthy diet. Foods like vegetables, fruits, whole grains, low-fat dairy products, and lean protein foods contain the nutrients you need without too many calories. Decrease your intake of foods high in solid fats, added sugars, and salt. Eat the right amount of calories for you.Get information about a proper diet from your health care provider, if necessary.  Regular physical exercise is one of the most important things you can do for your health. Most adults should get at least 150 minutes of moderate-intensity exercise (any activity that increases your heart rate and causes you to sweat) each week. In addition, most adults need muscle-strengthening exercises on 2 or more days a week.  Maintain a healthy weight. The body mass index (BMI) is a screening tool to identify possible weight problems. It provides an estimate of body fat based on height and weight. Your health care provider can find your BMI and can help you achieve or maintain a healthy weight.For adults 20 years and older:  A BMI below 18.5 is considered underweight.  A BMI of 18.5 to 24.9 is normal.  A BMI of 25 to 29.9 is considered overweight.  A  BMI of 30 and above is considered obese.  Maintain normal blood lipids and cholesterol levels by exercising and minimizing your intake of saturated fat. Eat a balanced diet with plenty of fruit and vegetables. Blood tests for lipids and cholesterol should begin at age 45 and be repeated every 5 years. If your lipid or cholesterol levels are high, you are over 50, or you are at high risk for heart disease, you may need your cholesterol levels checked more frequently.Ongoing high lipid and cholesterol levels should be treated with medicines if diet and exercise are not working.  If you smoke, find out from your health care provider how to quit. If you do not use tobacco, do not start.  Lung cancer screening is recommended for adults aged 45-80 years who are at high risk for developing lung cancer because of a history of smoking. A yearly low-dose CT scan of the lungs is recommended for people who have at least a 30-pack-year history of smoking and are a current smoker or have quit within the past 15 years. A pack year of smoking is smoking an average of 1 pack of cigarettes a day for 1 year (for example: 1 pack a day for 30 years or 2 packs a day for 15 years). Yearly screening should continue until the smoker has stopped smoking for at least 15 years. Yearly screening should be stopped for people who develop a health problem that would prevent them from having lung cancer treatment.  If you are pregnant, do not drink alcohol. If you are  breastfeeding, be very cautious about drinking alcohol. If you are not pregnant and choose to drink alcohol, do not have more than 1 drink per day. One drink is considered to be 12 ounces (355 mL) of beer, 5 ounces (148 mL) of wine, or 1.5 ounces (44 mL) of liquor.  Avoid use of street drugs. Do not share needles with anyone. Ask for help if you need support or instructions about stopping the use of drugs.  High blood pressure causes heart disease and increases the risk  of stroke. Your blood pressure should be checked at least every 1 to 2 years. Ongoing high blood pressure should be treated with medicines if weight loss and exercise do not work.  If you are 71-56 years old, ask your health care provider if you should take aspirin to prevent strokes.  Diabetes screening is done by taking a blood sample to check your blood glucose level after you have not eaten for a certain period of time (fasting). If you are not overweight and you do not have risk factors for diabetes, you should be screened once every 3 years starting at age 62. If you are overweight or obese and you are 23-5 years of age, you should be screened for diabetes every year as part of your cardiovascular risk assessment.  Breast cancer screening is essential preventive care for women. You should practice "breast self-awareness." This means understanding the normal appearance and feel of your breasts and may include breast self-examination. Any changes detected, no matter how small, should be reported to a health care provider. Women in their 39s and 30s should have a clinical breast exam (CBE) by a health care provider as part of a regular health exam every 1 to 3 years. After age 68, women should have a CBE every year. Starting at age 19, women should consider having a mammogram (breast X-ray test) every year. Women who have a family history of breast cancer should talk to their health care provider about genetic screening. Women at a high risk of breast cancer should talk to their health care providers about having an MRI and a mammogram every year.  Breast cancer gene (BRCA)-related cancer risk assessment is recommended for women who have family members with BRCA-related cancers. BRCA-related cancers include breast, ovarian, tubal, and peritoneal cancers. Having family members with these cancers may be associated with an increased risk for harmful changes (mutations) in the breast cancer genes BRCA1 and  BRCA2. Results of the assessment will determine the need for genetic counseling and BRCA1 and BRCA2 testing.  Your health care provider may recommend that you be screened regularly for cancer of the pelvic organs (ovaries, uterus, and vagina). This screening involves a pelvic examination, including checking for microscopic changes to the surface of your cervix (Pap test). You may be encouraged to have this screening done every 3 years, beginning at age 67.  For women ages 56-65, health care providers may recommend pelvic exams and Pap testing every 3 years, or they may recommend the Pap and pelvic exam, combined with testing for human papilloma virus (HPV), every 5 years. Some types of HPV increase your risk of cervical cancer. Testing for HPV may also be done on women of any age with unclear Pap test results.  Other health care providers may not recommend any screening for nonpregnant women who are considered low risk for pelvic cancer and who do not have symptoms. Ask your health care provider if a screening pelvic exam is right for  you.  If you have had past treatment for cervical cancer or a condition that could lead to cancer, you need Pap tests and screening for cancer for at least 20 years after your treatment. If Pap tests have been discontinued, your risk factors (such as having a new sexual partner) need to be reassessed to determine if screening should resume. Some women have medical problems that increase the chance of getting cervical cancer. In these cases, your health care provider may recommend more frequent screening and Pap tests.  Colorectal cancer can be detected and often prevented. Most routine colorectal cancer screening begins at the age of 50 years and continues through age 75 years. However, your health care provider may recommend screening at an earlier age if you have risk factors for colon cancer. On a yearly basis, your health care provider may provide home test kits to check  for hidden blood in the stool. Use of a small camera at the end of a tube, to directly examine the colon (sigmoidoscopy or colonoscopy), can detect the earliest forms of colorectal cancer. Talk to your health care provider about this at age 50, when routine screening begins. Direct exam of the colon should be repeated every 5-10 years through age 75 years, unless early forms of precancerous polyps or small growths are found.  People who are at an increased risk for hepatitis B should be screened for this virus. You are considered at high risk for hepatitis B if:  You were born in a country where hepatitis B occurs often. Talk with your health care provider about which countries are considered high risk.  Your parents were born in a high-risk country and you have not received a shot to protect against hepatitis B (hepatitis B vaccine).  You have HIV or AIDS.  You use needles to inject street drugs.  You live with, or have sex with, someone who has hepatitis B.  You get hemodialysis treatment.  You take certain medicines for conditions like cancer, organ transplantation, and autoimmune conditions.  Hepatitis C blood testing is recommended for all people born from 1945 through 1965 and any individual with known risks for hepatitis C.  Practice safe sex. Use condoms and avoid high-risk sexual practices to reduce the spread of sexually transmitted infections (STIs). STIs include gonorrhea, chlamydia, syphilis, trichomonas, herpes, HPV, and human immunodeficiency virus (HIV). Herpes, HIV, and HPV are viral illnesses that have no cure. They can result in disability, cancer, and death.  You should be screened for sexually transmitted illnesses (STIs) including gonorrhea and chlamydia if:  You are sexually active and are younger than 24 years.  You are older than 24 years and your health care provider tells you that you are at risk for this type of infection.  Your sexual activity has changed  since you were last screened and you are at an increased risk for chlamydia or gonorrhea. Ask your health care provider if you are at risk.  If you are at risk of being infected with HIV, it is recommended that you take a prescription medicine daily to prevent HIV infection. This is called preexposure prophylaxis (PrEP). You are considered at risk if:  You are sexually active and do not regularly use condoms or know the HIV status of your partner(s).  You take drugs by injection.  You are sexually active with a partner who has HIV.  Talk with your health care provider about whether you are at high risk of being infected with HIV. If   you choose to begin PrEP, you should first be tested for HIV. You should then be tested every 3 months for as long as you are taking PrEP.  Osteoporosis is a disease in which the bones lose minerals and strength with aging. This can result in serious bone fractures or breaks. The risk of osteoporosis can be identified using a bone density scan. Women ages 67 years and over and women at risk for fractures or osteoporosis should discuss screening with their health care providers. Ask your health care provider whether you should take a calcium supplement or vitamin D to reduce the rate of osteoporosis.  Menopause can be associated with physical symptoms and risks. Hormone replacement therapy is available to decrease symptoms and risks. You should talk to your health care provider about whether hormone replacement therapy is right for you.  Use sunscreen. Apply sunscreen liberally and repeatedly throughout the day. You should seek shade when your shadow is shorter than you. Protect yourself by wearing long sleeves, pants, a wide-brimmed hat, and sunglasses year round, whenever you are outdoors.  Once a month, do a whole body skin exam, using a mirror to look at the skin on your back. Tell your health care provider of new moles, moles that have irregular borders, moles that  are larger than a pencil eraser, or moles that have changed in shape or color.  Stay current with required vaccines (immunizations).  Influenza vaccine. All adults should be immunized every year.  Tetanus, diphtheria, and acellular pertussis (Td, Tdap) vaccine. Pregnant women should receive 1 dose of Tdap vaccine during each pregnancy. The dose should be obtained regardless of the length of time since the last dose. Immunization is preferred during the 27th-36th week of gestation. An adult who has not previously received Tdap or who does not know her vaccine status should receive 1 dose of Tdap. This initial dose should be followed by tetanus and diphtheria toxoids (Td) booster doses every 10 years. Adults with an unknown or incomplete history of completing a 3-dose immunization series with Td-containing vaccines should begin or complete a primary immunization series including a Tdap dose. Adults should receive a Td booster every 10 years.  Varicella vaccine. An adult without evidence of immunity to varicella should receive 2 doses or a second dose if she has previously received 1 dose. Pregnant females who do not have evidence of immunity should receive the first dose after pregnancy. This first dose should be obtained before leaving the health care facility. The second dose should be obtained 4-8 weeks after the first dose.  Human papillomavirus (HPV) vaccine. Females aged 13-26 years who have not received the vaccine previously should obtain the 3-dose series. The vaccine is not recommended for use in pregnant females. However, pregnancy testing is not needed before receiving a dose. If a female is found to be pregnant after receiving a dose, no treatment is needed. In that case, the remaining doses should be delayed until after the pregnancy. Immunization is recommended for any person with an immunocompromised condition through the age of 61 years if she did not get any or all doses earlier. During the  3-dose series, the second dose should be obtained 4-8 weeks after the first dose. The third dose should be obtained 24 weeks after the first dose and 16 weeks after the second dose.  Zoster vaccine. One dose is recommended for adults aged 30 years or older unless certain conditions are present.  Measles, mumps, and rubella (MMR) vaccine. Adults born  before 1957 generally are considered immune to measles and mumps. Adults born in [birth year] or later should have 1 or more doses of MMR vaccine unless there is a contraindication to the vaccine or there is laboratory evidence of immunity to each of the three diseases. A routine second dose of MMR vaccine should be obtained at least 28 days after the first dose for students attending postsecondary schools, health care workers, or international travelers. People who received inactivated measles vaccine or an unknown type of measles vaccine during 1963-1967 should receive 2 doses of MMR vaccine. People who received inactivated mumps vaccine or an unknown type of mumps vaccine before 1979 and are at high risk for mumps infection should consider immunization with 2 doses of MMR vaccine. For females of childbearing age, rubella immunity should be determined. If there is no evidence of immunity, females who are not pregnant should be vaccinated. If there is no evidence of immunity, females who are pregnant should delay immunization until after pregnancy. Unvaccinated health care workers born before [birth year] who lack laboratory evidence of measles, mumps, or rubella immunity or laboratory confirmation of disease should consider measles and mumps immunization with 2 doses of MMR vaccine or rubella immunization with 1 dose of MMR vaccine.  Pneumococcal 13-valent conjugate (PCV13) vaccine. When indicated, a person who is uncertain of his immunization history and has no record of immunization should receive the PCV13 vaccine. All adults 39 years of age and older should receive this  vaccine. An adult aged 6 years or older who has certain medical conditions and has not been previously immunized should receive 1 dose of PCV13 vaccine. This PCV13 should be followed with a dose of pneumococcal polysaccharide (PPSV23) vaccine. Adults who are at high risk for pneumococcal disease should obtain the PPSV23 vaccine at least 8 weeks after the dose of PCV13 vaccine. Adults older than 76 years of age who have normal immune system function should obtain the PPSV23 vaccine dose at least 1 year after the dose of PCV13 vaccine.  Pneumococcal polysaccharide (PPSV23) vaccine. When PCV13 is also indicated, PCV13 should be obtained first. All adults aged 13 years and older should be immunized. An adult younger than age 82 years who has certain medical conditions should be immunized. Any person who resides in a nursing home or long-term care facility should be immunized. An adult smoker should be immunized. People with an immunocompromised condition and certain other conditions should receive both PCV13 and PPSV23 vaccines. People with human immunodeficiency virus (HIV) infection should be immunized as soon as possible after diagnosis. Immunization during chemotherapy or radiation therapy should be avoided. Routine use of PPSV23 vaccine is not recommended for American Indians, [place] Natives, or people younger than 65 years unless there are medical conditions that require PPSV23 vaccine. When indicated, people who have unknown immunization and have no record of immunization should receive PPSV23 vaccine. One-time revaccination 5 years after the first dose of PPSV23 is recommended for people aged 19-64 years who have chronic kidney failure, nephrotic syndrome, asplenia, or immunocompromised conditions. People who received 1-2 doses of PPSV23 before age 71 years should receive another dose of PPSV23 vaccine at age 98 years or later if at least 5 years have passed since the previous dose. Doses of PPSV23 are not  needed for people immunized with PPSV23 at or after age 56 years.  Meningococcal vaccine. Adults with asplenia or persistent complement component deficiencies should receive 2 doses of quadrivalent meningococcal conjugate (MenACWY-D) vaccine. The doses should be obtained  at least 2 months apart. Microbiologists working with certain meningococcal bacteria, [employer] recruits, people at risk during an outbreak, and people who travel to or live in countries with a high rate of meningitis should be immunized. A first-year college student up through age 19 years who is living in a residence hall should receive a dose if she did not receive a dose on or after her 16th birthday. Adults who have certain high-risk conditions should receive one or more doses of vaccine.  Hepatitis A vaccine. Adults who wish to be protected from this disease, have certain high-risk conditions, work with hepatitis A-infected animals, work in hepatitis A research labs, or travel to or work in countries with a high rate of hepatitis A should be immunized. Adults who were previously unvaccinated and who anticipate close contact with an international adoptee during the first 60 days after arrival in the [place] from a country with a high rate of hepatitis A should be immunized.  Hepatitis B vaccine. Adults who wish to be protected from this disease, have certain high-risk conditions, may be exposed to blood or other infectious body fluids, are household contacts or sex partners of hepatitis B positive people, are clients or workers in certain care facilities, or travel to or work in countries with a high rate of hepatitis B should be immunized.  Haemophilus influenzae type b (Hib) vaccine. A previously unvaccinated person with asplenia or sickle cell disease or having a scheduled splenectomy should receive 1 dose of Hib vaccine. Regardless of previous immunization, a recipient of a hematopoietic stem cell transplant should receive a  3-dose series 6-12 months after her successful transplant. Hib vaccine is not recommended for adults with HIV infection. Preventive Services / Frequency Ages 83 to 57 years  Blood pressure check.** / Every 3-5 years.  Lipid and cholesterol check.** / Every 5 years beginning at age 59.  Clinical breast exam.** / Every 3 years for women in their 47s and 73s.  BRCA-related cancer risk assessment.** / For women who have family members with a BRCA-related cancer (breast, ovarian, tubal, or peritoneal cancers).  Pap test.** / Every 2 years from ages 71 through 36. Every 3 years starting at age 69 through age 93 or 4 with a history of 3 consecutive normal Pap tests.  HPV screening.** / Every 3 years from ages 73 through ages 35 to 56 with a history of 3 consecutive normal Pap tests.  Hepatitis C blood test.** / For any individual with known risks for hepatitis C.  Skin self-exam. / Monthly.  Influenza vaccine. / Every year.  Tetanus, diphtheria, and acellular pertussis (Tdap, Td) vaccine.** / Consult your health care provider. Pregnant women should receive 1 dose of Tdap vaccine during each pregnancy. 1 dose of Td every 10 years.  Varicella vaccine.** / Consult your health care provider. Pregnant females who do not have evidence of immunity should receive the first dose after pregnancy.  HPV vaccine. / 3 doses over 6 months, if 79 and younger. The vaccine is not recommended for use in pregnant females. However, pregnancy testing is not needed before receiving a dose.  Measles, mumps, rubella (MMR) vaccine.** / You need at least 1 dose of MMR if you were born in 1957 or later. You may also need a 2nd dose. For females of childbearing age, rubella immunity should be determined. If there is no evidence of immunity, females who are not pregnant should be vaccinated. If there is no evidence of immunity, females who are  pregnant should delay immunization until after pregnancy.  Pneumococcal  13-valent conjugate (PCV13) vaccine.** / Consult your health care provider.  Pneumococcal polysaccharide (PPSV23) vaccine.** / 1 to 2 doses if you smoke cigarettes or if you have certain conditions.  Meningococcal vaccine.** / 1 dose if you are age 76 to 39 years and a Market researcher living in a residence hall, or have one of several medical conditions, you need to get vaccinated against meningococcal disease. You may also need additional booster doses.  Hepatitis A vaccine.** / Consult your health care provider.  Hepatitis B vaccine.** / Consult your health care provider.  Haemophilus influenzae type b (Hib) vaccine.** / Consult your health care provider. Ages 67 to 44 years  Blood pressure check.** / Every year.  Lipid and cholesterol check.** / Every 5 years beginning at age 37 years.  Lung cancer screening. / Every year if you are aged 43-80 years and have a 30-pack-year history of smoking and currently smoke or have quit within the past 15 years. Yearly screening is stopped once you have quit smoking for at least 15 years or develop a health problem that would prevent you from having lung cancer treatment.  Clinical breast exam.** / Every year after age 64 years.  BRCA-related cancer risk assessment.** / For women who have family members with a BRCA-related cancer (breast, ovarian, tubal, or peritoneal cancers).  Mammogram.** / Every year beginning at age 65 years and continuing for as long as you are in good health. Consult with your health care provider.  Pap test.** / Every 3 years starting at age 22 years through age 28 or 16 years with a history of 3 consecutive normal Pap tests.  HPV screening.** / Every 3 years from ages 74 years through ages 89 to 44 years with a history of 3 consecutive normal Pap tests.  Fecal occult blood test (FOBT) of stool. / Every year beginning at age 44 years and continuing until age 42 years. You may not need to do this test if you get  a colonoscopy every 10 years.  Flexible sigmoidoscopy or colonoscopy.** / Every 5 years for a flexible sigmoidoscopy or every 10 years for a colonoscopy beginning at age 93 years and continuing until age 26 years.  Hepatitis C blood test.** / For all people born from [birth year] through 1966 and any individual with known risks for hepatitis C.  Skin self-exam. / Monthly.  Influenza vaccine. / Every year.  Tetanus, diphtheria, and acellular pertussis (Tdap/Td) vaccine.** / Consult your health care provider. Pregnant women should receive 1 dose of Tdap vaccine during each pregnancy. 1 dose of Td every 10 years.  Varicella vaccine.** / Consult your health care provider. Pregnant females who do not have evidence of immunity should receive the first dose after pregnancy.  Zoster vaccine.** / 1 dose for adults aged 22 years or older.  Measles, mumps, rubella (MMR) vaccine.** / You need at least 1 dose of MMR if you were born in 1957 or later. You may also need a second dose. For females of childbearing age, rubella immunity should be determined. If there is no evidence of immunity, females who are not pregnant should be vaccinated. If there is no evidence of immunity, females who are pregnant should delay immunization until after pregnancy.  Pneumococcal 13-valent conjugate (PCV13) vaccine.** / Consult your health care provider.  Pneumococcal polysaccharide (PPSV23) vaccine.** / 1 to 2 doses if you smoke cigarettes or if you have certain conditions.  Meningococcal vaccine.** /  Consult your health care provider.  Hepatitis A vaccine.** / Consult your health care provider.  Hepatitis B vaccine.** / Consult your health care provider.  Haemophilus influenzae type b (Hib) vaccine.** / Consult your health care provider. Ages 64 years and over  Blood pressure check.** / Every year.  Lipid and cholesterol check.** / Every 5 years beginning at age 23 years.  Lung cancer screening. / Every year if you  are aged 16-80 years and have a 30-pack-year history of smoking and currently smoke or have quit within the past 15 years. Yearly screening is stopped once you have quit smoking for at least 15 years or develop a health problem that would prevent you from having lung cancer treatment.  Clinical breast exam.** / Every year after age 74 years.  BRCA-related cancer risk assessment.** / For women who have family members with a BRCA-related cancer (breast, ovarian, tubal, or peritoneal cancers).  Mammogram.** / Every year beginning at age 44 years and continuing for as long as you are in good health. Consult with your health care provider.  Pap test.** / Every 3 years starting at age 58 years through age 22 or 39 years with 3 consecutive normal Pap tests. Testing can be stopped between 65 and 70 years with 3 consecutive normal Pap tests and no abnormal Pap or HPV tests in the past 10 years.  HPV screening.** / Every 3 years from ages 64 years through ages 70 or 61 years with a history of 3 consecutive normal Pap tests. Testing can be stopped between 65 and 70 years with 3 consecutive normal Pap tests and no abnormal Pap or HPV tests in the past 10 years.  Fecal occult blood test (FOBT) of stool. / Every year beginning at age 40 years and continuing until age 27 years. You may not need to do this test if you get a colonoscopy every 10 years.  Flexible sigmoidoscopy or colonoscopy.** / Every 5 years for a flexible sigmoidoscopy or every 10 years for a colonoscopy beginning at age 7 years and continuing until age 32 years.  Hepatitis C blood test.** / For all people born from [birth year] through 1966 and any individual with known risks for hepatitis C.  Osteoporosis screening.** / A one-time screening for women ages 30 years and over and women at risk for fractures or osteoporosis.  Skin self-exam. / Monthly.  Influenza vaccine. / Every year.  Tetanus, diphtheria, and acellular pertussis (Tdap/Td)  vaccine.** / 1 dose of Td every 10 years.  Varicella vaccine.** / Consult your health care provider.  Zoster vaccine.** / 1 dose for adults aged 35 years or older.  Pneumococcal 13-valent conjugate (PCV13) vaccine.** / Consult your health care provider.  Pneumococcal polysaccharide (PPSV23) vaccine.** / 1 dose for all adults aged 46 years and older.  Meningococcal vaccine.** / Consult your health care provider.  Hepatitis A vaccine.** / Consult your health care provider.  Hepatitis B vaccine.** / Consult your health care provider.  Haemophilus influenzae type b (Hib) vaccine.** / Consult your health care provider. ** Family history and personal history of risk and conditions may change your health care provider's recommendations.   This information is not intended to replace advice given to you by your health care provider. Make sure you discuss any questions you have with your health care provider.   Document Released: 02/06/2002 Document Revised: 01/01/2015 Document Reviewed: 05/08/2011 Elsevier Interactive Patient Education [employer].
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
## 2016-07-13 NOTE — Progress Notes (Signed)
Pre visit review using our clinic review tool, if applicable. No additional management support is needed unless otherwise documented below in the visit note. 

## 2016-07-16 DIAGNOSIS — Z0000 Encounter for general adult medical examination without abnormal findings: Secondary | ICD-10-CM | POA: Insufficient documentation

## 2016-07-16 NOTE — Assessment & Plan Note (Signed)

## 2016-08-22 ENCOUNTER — Other Ambulatory Visit: Payer: Self-pay | Admitting: Internal Medicine

## 2016-08-22 ENCOUNTER — Telehealth: Payer: Self-pay

## 2016-08-22 DIAGNOSIS — F5105 Insomnia due to other mental disorder: Principal | ICD-10-CM

## 2016-08-22 DIAGNOSIS — F409 Phobic anxiety disorder, unspecified: Secondary | ICD-10-CM | POA: Insufficient documentation

## 2016-08-22 MED ORDER — ZOLPIDEM TARTRATE 10 MG PO TABS: 10.0000 mg | ORAL_TABLET | Freq: Every evening | ORAL | 3 refills | Status: DC | PRN

## 2016-08-22 NOTE — Telephone Encounter (Signed)
Pt informed that fax has been sent.

## 2016-08-22 NOTE — Telephone Encounter (Signed)
rx faxed to [facility].

## 2016-08-22 NOTE — Telephone Encounter (Signed)
Pt called and requested rx for ambien. rq is for 10 mg but insurance will not cover.   She has not taken in 2 years.  LOV with PCP was 07/13/2016.

## 2016-08-22 NOTE — Telephone Encounter (Signed)
RX written 

## 2016-08-23 DIAGNOSIS — H25813 Combined forms of age-related cataract, bilateral: Secondary | ICD-10-CM | POA: Diagnosis not present

## 2016-08-23 DIAGNOSIS — H04123 Dry eye syndrome of bilateral lacrimal glands: Secondary | ICD-10-CM | POA: Diagnosis not present

## 2016-08-23 DIAGNOSIS — H5213 Myopia, bilateral: Secondary | ICD-10-CM | POA: Diagnosis not present

## 2016-08-23 DIAGNOSIS — H16103 Unspecified superficial keratitis, bilateral: Secondary | ICD-10-CM | POA: Diagnosis not present

## 2016-09-21 ENCOUNTER — Telehealth (HOSPITAL_COMMUNITY): Payer: Self-pay | Admitting: Cardiology

## 2016-09-21 ENCOUNTER — Encounter (HOSPITAL_COMMUNITY): Payer: Self-pay | Admitting: Internal Medicine

## 2016-09-21 ENCOUNTER — Telehealth (HOSPITAL_COMMUNITY): Payer: Self-pay | Admitting: Vascular Surgery

## 2016-09-21 ENCOUNTER — Ambulatory Visit (HOSPITAL_COMMUNITY)
Admission: RE | Admit: 2016-09-21 | Discharge: 2016-09-21 | Disposition: A | Discharge status: Home / Self Care | Payer: Medicare Other | Source: Ambulatory Visit | Attending: Cardiology | Admitting: Cardiology

## 2016-09-21 VITALS — BP 120/86 | HR 120 | Wt 189.4 lb

## 2016-09-21 DIAGNOSIS — I4510 Unspecified right bundle-branch block: Secondary | ICD-10-CM | POA: Insufficient documentation

## 2016-09-21 DIAGNOSIS — Z7901 Long term (current) use of anticoagulants: Secondary | ICD-10-CM | POA: Insufficient documentation

## 2016-09-21 DIAGNOSIS — R0602 Shortness of breath: Secondary | ICD-10-CM | POA: Diagnosis not present

## 2016-09-21 DIAGNOSIS — I480 Paroxysmal atrial fibrillation: Secondary | ICD-10-CM | POA: Diagnosis not present

## 2016-09-21 DIAGNOSIS — I4891 Unspecified atrial fibrillation: Secondary | ICD-10-CM

## 2016-09-21 DIAGNOSIS — E785 Hyperlipidemia, unspecified: Secondary | ICD-10-CM | POA: Diagnosis not present

## 2016-09-21 DIAGNOSIS — Z9889 Other specified postprocedural states: Secondary | ICD-10-CM | POA: Insufficient documentation

## 2016-09-21 DIAGNOSIS — Z86718 Personal history of other venous thrombosis and embolism: Secondary | ICD-10-CM | POA: Diagnosis not present

## 2016-09-21 DIAGNOSIS — I4892 Unspecified atrial flutter: Secondary | ICD-10-CM | POA: Diagnosis not present

## 2016-09-21 DIAGNOSIS — E559 Vitamin D deficiency, unspecified: Secondary | ICD-10-CM | POA: Insufficient documentation

## 2016-09-21 DIAGNOSIS — R739 Hyperglycemia, unspecified: Secondary | ICD-10-CM | POA: Diagnosis not present

## 2016-09-21 DIAGNOSIS — K859 Acute pancreatitis without necrosis or infection, unspecified: Secondary | ICD-10-CM | POA: Insufficient documentation

## 2016-09-21 DIAGNOSIS — Z86711 Personal history of pulmonary embolism: Secondary | ICD-10-CM | POA: Insufficient documentation

## 2016-09-21 DIAGNOSIS — K8050 Calculus of bile duct without cholangitis or cholecystitis without obstruction: Secondary | ICD-10-CM | POA: Insufficient documentation

## 2016-09-21 MED ORDER — DILTIAZEM HCL 60 MG PO TABS
60.0000 mg | ORAL_TABLET | Freq: Once | ORAL | Status: AC
  Administered 2016-09-21: 60 mg via ORAL
  Filled 2016-09-21: qty 1

## 2016-09-21 MED ORDER — FLECAINIDE ACETATE 100 MG PO TABS
300.0000 mg | ORAL_TABLET | Freq: Once | ORAL | Status: AC
Start: 2016-09-21 — End: 2016-09-21
  Administered 2016-09-21: 300 mg via ORAL
  Filled 2016-09-21: qty 3

## 2016-09-21 NOTE — Telephone Encounter (Signed)
No fever or chills??   She has history of recurrent Fib/Flutter, so she could come in for EKG.     Legrand Como [address] [place], PA-C 09/21/2016 10:42 AM

## 2016-09-21 NOTE — Telephone Encounter (Signed)
Patient called with concerns of increased  SOB  Patient reports over the past 2-3 days SOB has increased (unable to walk up stairs or hills as well)  Patient will be leaving for [place] on 10/16.  Denies- edema, cough, changes in weight ( weight stable @185 ), cough or chest pains No new medication changes   Please advise

## 2016-09-21 NOTE — Telephone Encounter (Signed)
PATIENT DENIES FEVER WILL REPORT FOR EKG TODAY

## 2016-09-21 NOTE — Progress Notes (Signed)
Patient ID: [name], female   DOB: [birth year], 75 y.o.   MRN: [id number]    Advanced Heart Failure Clinic Note   HPI:  [name] is a 75 y/o woman with h/o of PAF and AFL s/p AFL ablation.   She was admitted in 11/13 with acute bilateral PE, rapid AF and heart failure after a long plane trip. .EF 40-45%.  On 10/28/12 Unsuccessful DCCV . Loaded amio and underwent repeat DC-CV on 11/13. Maintained on Xarelto. In 11/13 had AFL.  Amio increased to 400 bid. On 12/10/12 successful DC-CV.  Amiodarone was eventually stopped.   04/22/13 ECHO EF 65%, mild LVH, normal RV size and systolic function, PA systolic pressure 35 mmHg .Had recurrent AF in 9/15. Amiodarone was restarted and she was cardioverted to NSR on 09/14/14. Seen again on Oct 16. Asymptomatic. Was back in AFL with RVR at 150. Underwent DC-CV but reverted to AFL. S/p A flutter ablation 12/15/2014. Was seen by Dr Rayann Heman in follow up last 01/08/15 and was stable at that time with no symptoms. She was in NSR at that time. Metoprolol stopped.   Pt returns today for acute add on for SOB.  States only occurs with hills and stairs over the past 2-3 days.  She denies peripheral edema, weight gain, cough, or CP. No fevers or chills.  EKG shows rapid Afib. Still very active.  Plans on taking trip to [place] next month, considering holding off.  She is not SOB with ADLs, dressing, or bathing, only with inclines, stairs, or if she gets into a hurry. Denies bleeding on Xarelto.  Has not missed any doses.   ECG: Afib RVR 154 bpm, Low voltage, Incomplete RBBB  Labs (7/15): K 4.2, creatinine 0.8  ROS: All systems negative except as listed in HPI, PMH and Problem List.  Past Medical History:  Diagnosis Date  . Atrial flutter ([place])    a. s/p CTI ablation 11/2014  . Diverticulosis 2002   Dr. Fuller Plan  . DVT (deep venous thrombosis) ([place]) 2013   "S/P a long airplane ride"  . Fasting hyperglycemia    A1C  within normal limits  . Hyperlipidemia   .  Pancreatitis due to common bile duct stone   . Paroxysmal atrial fibrillation ([facility])   . Personal history of colonic polyps 2002   tubular adenoma  . Pulmonary embolism ([place]) 2013   "S/P a long airplane ride"  . Tortuous colon   . Vitamin D deficiency     Current Outpatient Prescriptions  Medication Sig Dispense Refill  . levothyroxine (SYNTHROID, LEVOTHROID) 50 MCG tablet TAKE 1/2 TABLET DAILY, EXCEPT 1 TABLET ON TUESDAY, THURSDAY AND SATURDAY. 64 tablet 3  . mirabegron ER (MYRBETRIQ) 50 MG TB24 tablet Take 1 tablet (50 mg total) by mouth daily. 90 tablet 3  . XARELTO 20 MG TABS tablet TAKE 1 TABLET ONCE DAILY. 90 tablet 3  . zolpidem (AMBIEN) 10 MG tablet Take 1 tablet (10 mg total) by mouth at bedtime as needed for sleep. 30 tablet 3   No current facility-administered medications for this encounter.    Vitals:   09/21/16 1547  BP: 120/86  Pulse: (!) 120  SpO2: 98%  Weight: 189 lb 6.4 oz (85.9 kg)     PHYSICAL EXAM: General: Appears younger than stated age.   HEENT: normal Neck: supple. JVP flat. Carotids 2+ bilaterally; no bruits. No thyromegaly or nodule noted.  Cor: PMI normal. Irregular, tachy. Lungs: CTAB, normal effort Abdomen: soft, NT, ND, no HSM.  No bruits or masses. +BS  Extremities: no cyanosis, clubbing, rash, edema Neuro: alert & orientedx3, cranial nerves grossly intact. Moves all 4 extremities w/o difficulty. Affect pleasant.  ECG: Afib RVR 154 bpm, Low voltage, Incomplete RBBB  ASSESSMENT & PLAN:  1) Hx of Atrial arrhythmias s/p aflutter ablation. With PAF and PAFL.  Now with Afib RVR - Has had history of recurrent AFL despite DC-CV (x 2 in 2013 and x 1 in 2015), now, as above s/p AFL ablation by Dr Rayann Heman. Long history of AF/AFL by EKGs - Continue Xarelto 20 mg daily. - In past ? component of OSA, Though she has refused sleep study.  - Pt presented today with recurrent Afib.  Given Flecainide 300 mg and Diltiazem 60 mg in office with no structural heart  disease.  2) Hx of DVT and PEs - s/p long flights in 2015 - Has not missed any Xarelto     If flecainide ineffective, will consider for DCCV per Dr. Webb Silversmith PA-C 3:47 PM 09/21/2016   Patient seen and examined with [name], PA-C. We discussed all aspects of the encounter. I agree with the assessment and plan as stated above.   She is back in AF with RVR. We attempted pill-in-the-pocket approach with flecainide and monitored her for over 2 hours but AF persisted. Have arranged for DCCV tomorrow in Endo (has not missed any doses of Xarelto) recently). If AF recurs wil have her f/u with Dr. Rayann Heman.   Coralynn Gaona,MD 8:03 AM

## 2016-09-21 NOTE — Patient Instructions (Signed)
Stay NPO (nothing to eat or drink after midnight) until we are able to arrange a cardioversion on 09/22/16. If you have not heard from [place] by 900am, please give [place] a call to follow up.  Your physician has recommended that you have a Cardioversion (DCCV). Electrical Cardioversion uses a jolt of electricity to your heart either through paddles or wired patches attached to your chest. This is a controlled, usually prescheduled, procedure. Defibrillation is done under light anesthesia in the hospital, and you usually go home the day of the procedure. This is done to get your heart back into a normal rhythm. You are not awake for the procedure. Please see the instruction sheet given to you today.

## 2016-09-22 ENCOUNTER — Ambulatory Visit (HOSPITAL_COMMUNITY)
Admission: RE | Admit: 2016-09-22 | Discharge: 2016-09-22 | Disposition: A | Discharge status: Home / Self Care | Payer: Medicare Other | Source: Ambulatory Visit | Attending: Internal Medicine | Admitting: Internal Medicine

## 2016-09-22 ENCOUNTER — Ambulatory Visit (HOSPITAL_COMMUNITY): Payer: Medicare Other | Admitting: Certified Registered Nurse Anesthetist

## 2016-09-22 ENCOUNTER — Encounter (HOSPITAL_COMMUNITY): Payer: Self-pay | Admitting: *Deleted

## 2016-09-22 ENCOUNTER — Encounter (HOSPITAL_COMMUNITY)
Admission: RE | Disposition: A | Discharge status: Home / Self Care | Payer: Self-pay | Source: Ambulatory Visit | Attending: Internal Medicine

## 2016-09-22 DIAGNOSIS — Z86718 Personal history of other venous thrombosis and embolism: Secondary | ICD-10-CM | POA: Diagnosis not present

## 2016-09-22 DIAGNOSIS — E039 Hypothyroidism, unspecified: Secondary | ICD-10-CM | POA: Diagnosis not present

## 2016-09-22 DIAGNOSIS — I4891 Unspecified atrial fibrillation: Secondary | ICD-10-CM | POA: Diagnosis not present

## 2016-09-22 DIAGNOSIS — Z86711 Personal history of pulmonary embolism: Secondary | ICD-10-CM | POA: Diagnosis not present

## 2016-09-22 DIAGNOSIS — Z7901 Long term (current) use of anticoagulants: Secondary | ICD-10-CM | POA: Insufficient documentation

## 2016-09-22 DIAGNOSIS — I480 Paroxysmal atrial fibrillation: Secondary | ICD-10-CM | POA: Insufficient documentation

## 2016-09-22 DIAGNOSIS — Z79899 Other long term (current) drug therapy: Secondary | ICD-10-CM | POA: Insufficient documentation

## 2016-09-22 DIAGNOSIS — E785 Hyperlipidemia, unspecified: Secondary | ICD-10-CM | POA: Diagnosis not present

## 2016-09-22 HISTORY — PX: CARDIOVERSION: SHX1299

## 2016-09-22 LAB — POCT I-STAT, CHEM 8
BUN: 33 mg/dL — ABNORMAL HIGH (ref 6–20)
CREATININE: 0.8 mg/dL (ref 0.44–1.00)
Calcium, Ion: 0.97 mmol/L — ABNORMAL LOW (ref 1.15–1.40)
Chloride: 111 mmol/L (ref 101–111)
Glucose, Bld: 95 mg/dL (ref 65–99)
HEMATOCRIT: 40 % (ref 36.0–46.0)
HEMOGLOBIN: 13.6 g/dL (ref 12.0–15.0)
POTASSIUM: 4.2 mmol/L (ref 3.5–5.1)
Sodium: 139 mmol/L (ref 135–145)
TCO2: 21 mmol/L (ref 0–100)

## 2016-09-22 SURGERY — CARDIOVERSION
Anesthesia: Monitor Anesthesia Care

## 2016-09-22 MED ORDER — LIDOCAINE HCL (CARDIAC) 20 MG/ML IV SOLN
INTRAVENOUS | Status: DC | PRN
  Administered 2016-09-22: 20 mg via INTRAVENOUS

## 2016-09-22 MED ORDER — SODIUM CHLORIDE 0.9 % IV SOLN
INTRAVENOUS | Status: DC
  Administered 2016-09-22 (×2): via INTRAVENOUS

## 2016-09-22 MED ORDER — PROPOFOL 10 MG/ML IV BOLUS
INTRAVENOUS | Status: DC | PRN
  Administered 2016-09-22: 90 mg via INTRAVENOUS

## 2016-09-22 NOTE — Interval H&P Note (Signed)
History and Physical Interval Note:  09/22/2016 1:19 PM  [name]  has presented today for surgery, with the diagnosis of a fib  The various methods of treatment have been discussed with the patient and family. After consideration of risks, benefits and other options for treatment, the patient has consented to  Procedure(s): CARDIOVERSION (N/A) as a surgical intervention .  The patient's history has been reviewed, patient examined, no change in status, stable for surgery.  I have reviewed the patient's chart and labs.  Questions were answered to the patient's satisfaction.     [name], Quillian Quince

## 2016-09-22 NOTE — Telephone Encounter (Signed)
Open in error

## 2016-09-22 NOTE — H&P (View-Only) (Signed)
Patient ID: [name], female   DOB: [birth year], 75 y.o.   MRN: [id number]    Advanced Heart Failure Clinic Note   HPI:  [name] is a 75 y/o woman with h/o of PAF and AFL s/p AFL ablation.   She was admitted in 11/13 with acute bilateral PE, rapid AF and heart failure after a long plane trip. .EF 40-45%.  On 10/28/12 Unsuccessful DCCV . Loaded amio and underwent repeat DC-CV on 11/13. Maintained on Xarelto. In 11/13 had AFL.  Amio increased to 400 bid. On 12/10/12 successful DC-CV.  Amiodarone was eventually stopped.   04/22/13 ECHO EF 65%, mild LVH, normal RV size and systolic function, PA systolic pressure 35 mmHg .Had recurrent AF in 9/15. Amiodarone was restarted and she was cardioverted to NSR on 09/14/14. Seen again on Oct 16. Asymptomatic. Was back in AFL with RVR at 150. Underwent DC-CV but reverted to AFL. S/p A flutter ablation 12/15/2014. Was seen by Dr Rayann Heman in follow up last 01/08/15 and was stable at that time with no symptoms. She was in NSR at that time. Metoprolol stopped.   Pt returns today for acute add on for SOB.  States only occurs with hills and stairs over the past 2-3 days.  She denies peripheral edema, weight gain, cough, or CP. No fevers or chills.  EKG shows rapid Afib. Still very active.  Plans on taking trip to [place] next month, considering holding off.  She is not SOB with ADLs, dressing, or bathing, only with inclines, stairs, or if she gets into a hurry. Denies bleeding on Xarelto.  Has not missed any doses.   ECG: Afib RVR 154 bpm, Low voltage, Incomplete RBBB  Labs (7/15): K 4.2, creatinine 0.8  ROS: All systems negative except as listed in HPI, PMH and Problem List.  Past Medical History:  Diagnosis Date  . Atrial flutter ([place])    a. s/p CTI ablation 11/2014  . Diverticulosis 2002   Dr. Fuller Plan  . DVT (deep venous thrombosis) ([place]) 2013   "S/P a long airplane ride"  . Fasting hyperglycemia    A1C  within normal limits  . Hyperlipidemia   .  Pancreatitis due to common bile duct stone   . Paroxysmal atrial fibrillation ([facility])   . Personal history of colonic polyps 2002   tubular adenoma  . Pulmonary embolism ([place]) 2013   "S/P a long airplane ride"  . Tortuous colon   . Vitamin D deficiency     Current Outpatient Prescriptions  Medication Sig Dispense Refill  . levothyroxine (SYNTHROID, LEVOTHROID) 50 MCG tablet TAKE 1/2 TABLET DAILY, EXCEPT 1 TABLET ON TUESDAY, THURSDAY AND SATURDAY. 64 tablet 3  . mirabegron ER (MYRBETRIQ) 50 MG TB24 tablet Take 1 tablet (50 mg total) by mouth daily. 90 tablet 3  . XARELTO 20 MG TABS tablet TAKE 1 TABLET ONCE DAILY. 90 tablet 3  . zolpidem (AMBIEN) 10 MG tablet Take 1 tablet (10 mg total) by mouth at bedtime as needed for sleep. 30 tablet 3   No current facility-administered medications for this encounter.    Vitals:   09/21/16 1547  BP: 120/86  Pulse: (!) 120  SpO2: 98%  Weight: 189 lb 6.4 oz (85.9 kg)     PHYSICAL EXAM: General: Appears younger than stated age.   HEENT: normal Neck: supple. JVP flat. Carotids 2+ bilaterally; no bruits. No thyromegaly or nodule noted.  Cor: PMI normal. Irregular, tachy. Lungs: CTAB, normal effort Abdomen: soft, NT, ND, no HSM.  No bruits or masses. +BS  Extremities: no cyanosis, clubbing, rash, edema Neuro: alert & orientedx3, cranial nerves grossly intact. Moves all 4 extremities w/o difficulty. Affect pleasant.  ECG: Afib RVR 154 bpm, Low voltage, Incomplete RBBB  ASSESSMENT & PLAN:  1) Hx of Atrial arrhythmias s/p aflutter ablation. With PAF and PAFL.  Now with Afib RVR - Has had history of recurrent AFL despite DC-CV (x 2 in 2013 and x 1 in 2015), now, as above s/p AFL ablation by Dr Rayann Heman. Long history of AF/AFL by EKGs - Continue Xarelto 20 mg daily. - In past ? component of OSA, Though she has refused sleep study.  - Pt presented today with recurrent Afib.  Given Flecainide 300 mg and Diltiazem 60 mg in office with no structural heart  disease.  2) Hx of DVT and PEs - s/p long flights in 2015 - Has not missed any Xarelto     If flecainide ineffective, will consider for DCCV per Dr. Webb Silversmith PA-C 3:47 PM 09/21/2016   Patient seen and examined with [name], PA-C. We discussed all aspects of the encounter. I agree with the assessment and plan as stated above.   She is back in AF with RVR. We attempted pill-in-the-pocket approach with flecainide and monitored her for over 2 hours but AF persisted. Have arranged for DCCV tomorrow in Endo (has not missed any doses of Xarelto) recently). If AF recurs wil have her f/u with Dr. Rayann Heman.   Ival Pacer,MD 8:03 AM

## 2016-09-22 NOTE — Transfer of Care (Signed)
Immediate Anesthesia Transfer of Care Note  Patient: [name]  Procedure(s) Performed: Procedure(s): CARDIOVERSION (N/A)  Patient Location: Endoscopy Unit  Anesthesia Type:General  Level of Consciousness: awake, alert , oriented and patient cooperative  Airway & Oxygen Therapy: Patient Spontanous Breathing and Patient connected to nasal cannula oxygen  Post-op Assessment: Report given to RN and Post -op Vital signs reviewed and stable  Post vital signs: Reviewed and stable  Last Vitals:  Vitals:   09/22/16 1022  BP: (!) 170/106  Pulse: (!) 133  Resp: 20    Last Pain:  Vitals:   09/22/16 1022  TempSrc: Oral         Complications: No apparent anesthesia complications

## 2016-09-22 NOTE — CV Procedure (Signed)
    DIRECT CURRENT CARDIOVERSION  NAME:  [name]   MRN: [id number] DOB:  09-15-1941   ADMIT DATE: 09/22/2016   INDICATIONS: Atrial fibrillation    PROCEDURE:   Informed consent was obtained prior to the procedure. The risks, benefits and alternatives for the procedure were discussed and the patient comprehended these risks. Once an appropriate time out was taken, the patient had the defibrillator pads placed in the anterior and posterior position. The patient then underwent sedation by the anesthesia service. Once an appropriate level of sedation was achieved, the patient received a single biphasic, synchronized 200J shock with prompt conversion to sinus rhythm. No apparent complications.  Bensimhon, Daniel,MD 11:38 AM

## 2016-09-22 NOTE — Discharge Instructions (Signed)
Electrical Cardioversion, Care After °Refer to this sheet in the next few weeks. These instructions provide you with information on caring for yourself after your procedure. Your health care provider may also give you more specific instructions. Your treatment has been planned according to current medical practices, but problems sometimes occur. Call your health care provider if you have any problems or questions after your procedure. °WHAT TO EXPECT AFTER THE PROCEDURE °After your procedure, it is typical to have the following sensations: °· Some redness on the skin where the shocks were delivered. If this is tender, a sunburn lotion or hydrocortisone cream may help. °· Possible return of an abnormal heart rhythm within hours or days after the procedure. °HOME CARE INSTRUCTIONS °· Take medicines only as directed by your health care provider. Be sure you understand how and when to take your medicine. °· Learn how to feel your pulse and check it often. °· Limit your activity for 48 hours after the procedure or as directed by your health care provider. °· Avoid or minimize caffeine and other stimulants as directed by your health care provider. °SEEK MEDICAL CARE IF: °· You feel like your heart is beating too fast or your pulse is not regular. °· You have any questions about your medicines. °· You have bleeding that will not stop. °SEEK IMMEDIATE MEDICAL CARE IF: °· You are dizzy or feel faint. °· It is hard to breathe or you feel short of breath. °· There is a change in discomfort in your chest. °· Your speech is slurred or you have trouble moving an arm or leg on one side of your body. °· You get a serious muscle cramp that does not go away. °· Your fingers or toes turn cold or blue. °  °This information is not intended to replace advice given to you by your health care provider. Make sure you discuss any questions you have with your health care provider. °  °Document Released: 10/01/2013 Document Revised: 01/01/2015  Document Reviewed: 10/01/2013 °Elsevier Interactive Patient Education ©2016 [employer] ° °

## 2016-09-22 NOTE — Anesthesia Preprocedure Evaluation (Addendum)
Anesthesia Evaluation  Patient identified by MRN, date of birth, ID band Patient awake    Reviewed: Allergy & Precautions, NPO status , Patient's Chart, lab work & pertinent test results  Airway Mallampati: II  TM Distance: >3 FB     Dental  (+) Teeth Intact, Dental Advisory Given   Pulmonary    breath sounds clear to auscultation       Cardiovascular  Rhythm:Irregular Rate:Normal     Neuro/Psych    GI/Hepatic   Endo/Other    Renal/GU      Musculoskeletal   Abdominal   Peds  Hematology   Anesthesia Other Findings   Reproductive/Obstetrics                            Anesthesia Physical Anesthesia Plan  ASA: III  Anesthesia Plan: General   Post-op Pain Management:    Induction:   Airway Management Planned: Mask  Additional Equipment:   Intra-op Plan:   Post-operative Plan:   Informed Consent: I have reviewed the patients History and Physical, chart, labs and discussed the procedure including the risks, benefits and alternatives for the proposed anesthesia with the patient or authorized representative who has indicated his/her understanding and acceptance.     Plan Discussed with: CRNA and Anesthesiologist  Anesthesia Plan Comments:         Anesthesia Quick Evaluation

## 2016-09-23 NOTE — Anesthesia Postprocedure Evaluation (Signed)
Anesthesia Post Note  Patient: [name]  Procedure(s) Performed: Procedure(s) (LRB): CARDIOVERSION (N/A)  Patient location during evaluation: Endoscopy Anesthesia Type: General Level of consciousness: awake, awake and alert and oriented Pain management: pain level controlled Vital Signs Assessment: post-procedure vital signs reviewed and stable Respiratory status: spontaneous breathing Cardiovascular status: blood pressure returned to baseline Postop Assessment: no headache Anesthetic complications: no    Last Vitals:  Vitals:   09/22/16 1140 09/22/16 1150  BP: 128/79 121/76  Pulse: 66 64  Resp: (!) 24 16    Last Pain:  Vitals:   09/22/16 1022  TempSrc: Oral                 Yonathan Perrow COKER

## 2016-10-30 ENCOUNTER — Telehealth (HOSPITAL_COMMUNITY): Payer: Self-pay

## 2016-10-30 NOTE — Telephone Encounter (Signed)
Patient called to report increased SOB since yesterday. Patient states "I'm pretty sure I'm back in afib" (had DCCV 1 month ago with Dr. Haroldine Laws. Patient reports weight stable. Denies palpitations, dizziness, CP, fever, chills, sweats.  No other s/s. Made apt for first available tomorrow at 2:40 pm. Advised she may come in to urgent Care or ED for EKG and workup. Patient states she feels ok for now and would like to wait to see Dr. Haroldine Laws. Advised if symptoms worsen to report to ED. Aware and agreeable to plan as stated above.  Renee Pain, RN

## 2016-10-31 ENCOUNTER — Inpatient Hospital Stay (HOSPITAL_COMMUNITY)
Admission: AD | Admit: 2016-10-31 | Discharge: 2016-11-02 | DRG: 310 | Disposition: A | Discharge status: Home / Self Care | Payer: Medicare Other | Source: Ambulatory Visit | Attending: Internal Medicine | Admitting: Internal Medicine

## 2016-10-31 ENCOUNTER — Encounter (HOSPITAL_COMMUNITY): Payer: Self-pay | Admitting: Internal Medicine

## 2016-10-31 ENCOUNTER — Ambulatory Visit (HOSPITAL_COMMUNITY)
Admission: RE | Admit: 2016-10-31 | Discharge: 2016-10-31 | Disposition: A | Discharge status: Home / Self Care | Payer: Medicare Other | Source: Ambulatory Visit | Attending: Internal Medicine | Admitting: Internal Medicine

## 2016-10-31 VITALS — BP 124/73 | HR 143 | Wt 186.5 lb

## 2016-10-31 DIAGNOSIS — I4891 Unspecified atrial fibrillation: Secondary | ICD-10-CM

## 2016-10-31 DIAGNOSIS — E039 Hypothyroidism, unspecified: Secondary | ICD-10-CM | POA: Diagnosis not present

## 2016-10-31 DIAGNOSIS — Z96653 Presence of artificial knee joint, bilateral: Secondary | ICD-10-CM | POA: Diagnosis not present

## 2016-10-31 DIAGNOSIS — Z7901 Long term (current) use of anticoagulants: Secondary | ICD-10-CM | POA: Diagnosis not present

## 2016-10-31 DIAGNOSIS — Z79899 Other long term (current) drug therapy: Secondary | ICD-10-CM | POA: Diagnosis not present

## 2016-10-31 DIAGNOSIS — Z885 Allergy status to narcotic agent status: Secondary | ICD-10-CM | POA: Diagnosis not present

## 2016-10-31 DIAGNOSIS — I509 Heart failure, unspecified: Secondary | ICD-10-CM | POA: Diagnosis not present

## 2016-10-31 DIAGNOSIS — Z880 Allergy status to penicillin: Secondary | ICD-10-CM

## 2016-10-31 DIAGNOSIS — I480 Paroxysmal atrial fibrillation: Secondary | ICD-10-CM | POA: Diagnosis not present

## 2016-10-31 DIAGNOSIS — Z86711 Personal history of pulmonary embolism: Secondary | ICD-10-CM

## 2016-10-31 DIAGNOSIS — Z8249 Family history of ischemic heart disease and other diseases of the circulatory system: Secondary | ICD-10-CM

## 2016-10-31 DIAGNOSIS — Z86718 Personal history of other venous thrombosis and embolism: Secondary | ICD-10-CM

## 2016-10-31 DIAGNOSIS — I4892 Unspecified atrial flutter: Secondary | ICD-10-CM | POA: Diagnosis not present

## 2016-10-31 LAB — COMPREHENSIVE METABOLIC PANEL
ALK PHOS: 70 U/L (ref 38–126)
ALT: 31 U/L (ref 14–54)
ANION GAP: 9 (ref 5–15)
AST: 25 U/L (ref 15–41)
Albumin: 3.6 g/dL (ref 3.5–5.0)
BUN: 22 mg/dL — ABNORMAL HIGH (ref 6–20)
CALCIUM: 9.3 mg/dL (ref 8.9–10.3)
CHLORIDE: 105 mmol/L (ref 101–111)
CO2: 23 mmol/L (ref 22–32)
CREATININE: 1.08 mg/dL — AB (ref 0.44–1.00)
GFR, EST AFRICAN AMERICAN: 56 mL/min — AB (ref >60)
GFR, EST NON AFRICAN AMERICAN: 49 mL/min — AB (ref >60)
Glucose, Bld: 104 mg/dL — ABNORMAL HIGH (ref 65–99)
Potassium: 4.5 mmol/L (ref 3.5–5.1)
SODIUM: 137 mmol/L (ref 135–145)
Total Bilirubin: 1.2 mg/dL (ref 0.3–1.2)
Total Protein: 6.3 g/dL — ABNORMAL LOW (ref 6.5–8.1)

## 2016-10-31 LAB — CBC WITH DIFFERENTIAL/PLATELET
Basophils Absolute: 0 10*3/uL (ref 0.0–0.1)
Basophils Relative: 0 %
EOS ABS: 0.1 10*3/uL (ref 0.0–0.7)
EOS PCT: 2 %
HCT: 39.8 % (ref 36.0–46.0)
Hemoglobin: 13 g/dL (ref 12.0–15.0)
LYMPHS ABS: 1 10*3/uL (ref 0.7–4.0)
LYMPHS PCT: 15 %
MCH: 29.3 pg (ref 26.0–34.0)
MCHC: 32.7 g/dL (ref 30.0–36.0)
MCV: 89.6 fL (ref 78.0–100.0)
MONOS PCT: 8 %
Monocytes Absolute: 0.5 10*3/uL (ref 0.1–1.0)
Neutro Abs: 4.9 10*3/uL (ref 1.7–7.7)
Neutrophils Relative %: 75 %
PLATELETS: 231 10*3/uL (ref 150–400)
RBC: 4.44 MIL/uL (ref 3.87–5.11)
RDW: 15 % (ref 11.5–15.5)
WBC: 6.6 10*3/uL (ref 4.0–10.5)

## 2016-10-31 LAB — TSH: TSH: 3.842 u[IU]/mL (ref 0.350–4.500)

## 2016-10-31 LAB — BRAIN NATRIURETIC PEPTIDE: B NATRIURETIC PEPTIDE 5: 277.4 pg/mL — AB (ref 0.0–100.0)

## 2016-10-31 LAB — MRSA PCR SCREENING: MRSA by PCR: NEGATIVE

## 2016-10-31 LAB — MAGNESIUM: MAGNESIUM: 1.9 mg/dL (ref 1.7–2.4)

## 2016-10-31 MED ORDER — ONDANSETRON HCL 4 MG/2ML IJ SOLN: 4.0000 mg | Freq: Four times a day (QID) | INTRAMUSCULAR | Status: DC | PRN

## 2016-10-31 MED ORDER — LEVOTHYROXINE SODIUM 50 MCG PO TABS
50.0000 ug | ORAL_TABLET | ORAL | Status: DC
  Administered 2016-11-02: 50 ug via ORAL
  Filled 2016-10-31: qty 1

## 2016-10-31 MED ORDER — MIRABEGRON ER 25 MG PO TB24
50.0000 mg | ORAL_TABLET | Freq: Every day | ORAL | Status: DC
  Administered 2016-11-01 – 2016-11-02 (×2): 50 mg via ORAL
  Filled 2016-10-31 (×2): qty 2

## 2016-10-31 MED ORDER — LEVOTHYROXINE SODIUM 25 MCG PO TABS
25.0000 ug | ORAL_TABLET | Freq: Every morning | ORAL | Status: DC
Start: 2016-11-01 — End: 2016-10-31

## 2016-10-31 MED ORDER — FLECAINIDE ACETATE 100 MG PO TABS
100.0000 mg | ORAL_TABLET | Freq: Two times a day (BID) | ORAL | Status: DC
  Administered 2016-10-31 – 2016-11-02 (×4): 100 mg via ORAL
  Filled 2016-10-31 (×4): qty 1

## 2016-10-31 MED ORDER — DILTIAZEM HCL 100 MG IV SOLR
5.0000 mg/h | INTRAVENOUS | Status: DC
  Administered 2016-10-31: 5 mg/h via INTRAVENOUS
  Filled 2016-10-31 (×2): qty 100

## 2016-10-31 MED ORDER — DILTIAZEM HCL 100 MG IV SOLR
5.0000 mg/h | INTRAVENOUS | Status: DC
  Administered 2016-10-31: 10 mg/h via INTRAVENOUS
  Administered 2016-11-01 (×2): 15 mg/h via INTRAVENOUS
  Filled 2016-10-31 (×4): qty 100

## 2016-10-31 MED ORDER — SODIUM CHLORIDE 0.9% FLUSH
3.0000 mL | Freq: Two times a day (BID) | INTRAVENOUS | Status: DC
  Administered 2016-11-01 (×2): 3 mL via INTRAVENOUS

## 2016-10-31 MED ORDER — ZOLPIDEM TARTRATE 5 MG PO TABS
5.0000 mg | ORAL_TABLET | Freq: Every evening | ORAL | Status: DC | PRN
  Administered 2016-10-31: 5 mg via ORAL
  Filled 2016-10-31: qty 1

## 2016-10-31 MED ORDER — RIVAROXABAN 20 MG PO TABS
20.0000 mg | ORAL_TABLET | Freq: Every day | ORAL | Status: DC
  Administered 2016-10-31 – 2016-11-01 (×2): 20 mg via ORAL
  Filled 2016-10-31 (×2): qty 1

## 2016-10-31 MED ORDER — SODIUM CHLORIDE 0.9% FLUSH: 3.0000 mL | INTRAVENOUS | Status: DC | PRN

## 2016-10-31 MED ORDER — METOPROLOL TARTRATE 5 MG/5ML IV SOLN
5.0000 mg | Freq: Once | INTRAVENOUS | Status: AC
  Administered 2016-10-31: 5 mg via INTRAVENOUS
  Filled 2016-10-31: qty 5

## 2016-10-31 MED ORDER — LEVOTHYROXINE SODIUM 25 MCG PO TABS
25.0000 ug | ORAL_TABLET | ORAL | Status: DC
  Administered 2016-11-01: 25 ug via ORAL
  Filled 2016-10-31: qty 1

## 2016-10-31 MED ORDER — ACETAMINOPHEN 325 MG PO TABS: 650.0000 mg | ORAL_TABLET | ORAL | Status: DC | PRN

## 2016-10-31 MED ORDER — DILTIAZEM LOAD VIA INFUSION
10.0000 mg | Freq: Once | INTRAVENOUS | Status: AC
  Administered 2016-10-31: 10 mg via INTRAVENOUS
  Filled 2016-10-31: qty 10

## 2016-10-31 MED ORDER — SODIUM CHLORIDE 0.9 % IV SOLN: 250.0000 mL | INTRAVENOUS | Status: DC | PRN
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
## 2016-10-31 NOTE — Progress Notes (Signed)
Called 3W and report given, patient transported to room.

## 2016-10-31 NOTE — Progress Notes (Addendum)
Patient ID: [name], female   DOB: 09/16/1941, 75 y.o.   MRN: [id number]    Advanced Heart Failure Clinic Note   HPI:  [name] is a 75 y/o woman with h/o of PAF and AFL s/p AFL ablation.   She was admitted in 11/13 with acute bilateral PE, rapid AF and heart failure after a long plane trip. .EF 40-45%.  On 10/28/12 Unsuccessful DCCV . Loaded amio and underwent repeat DC-CV on 11/13. Maintained on Xarelto. In 11/13 had AFL.  Amio increased to 400 bid. On 12/10/12 successful DC-CV.  Amiodarone was eventually stopped.   04/22/13 ECHO EF 65%, mild LVH, normal RV size and systolic function, PA systolic pressure 35 mmHg .Had recurrent AF in 9/15. Amiodarone was restarted and she was cardioverted to NSR on 09/14/14. Seen again on Oct 16. Asymptomatic. Was back in AFL with RVR at 150. Underwent DC-CV but reverted to AFL. S/p A flutter ablation 12/15/2014. Was seen by Dr Rayann Heman in follow up last 01/08/15 and was stable at that time with no symptoms. She was in NSR at that time. Metoprolol stopped.   Pt returns today for add on for worsening SOB and feeling like she is back out of rhythm. Worsening SOB started Sunday.  Has been having tachy-palpitations. Mild SOB with ADLs. No bleeding on Xarelto.  Has not missed any doses.   ECG: Afib RVR 185 bpm, Low voltage QRS  Labs (7/15): K 4.2, creatinine 0.8  ROS: All systems negative except as listed in HPI, PMH and Problem List.  Past Medical History:  Diagnosis Date  . Atrial flutter ([place])    a. s/p CTI ablation 11/2014  . Diverticulosis 2002   Dr. Fuller Plan  . DVT (deep venous thrombosis) ([place]) 2013   "S/P a long airplane ride"  . Fasting hyperglycemia    A1C  within normal limits  . Hyperlipidemia   . Pancreatitis due to common bile duct stone   . Paroxysmal atrial fibrillation ([facility])   . Personal history of colonic polyps 2002   tubular adenoma  . Pulmonary embolism ([place]) 2013   "S/P a long airplane ride"  . Tortuous colon   . Vitamin D  deficiency     Current Outpatient Prescriptions  Medication Sig Dispense Refill  . levothyroxine (SYNTHROID, LEVOTHROID) 50 MCG tablet TAKE 1/2 TABLET DAILY, EXCEPT 1 TABLET ON TUESDAY, THURSDAY AND SATURDAY. 64 tablet 3  . mirabegron ER (MYRBETRIQ) 50 MG TB24 tablet Take 1 tablet (50 mg total) by mouth daily. 90 tablet 3  . XARELTO 20 MG TABS tablet TAKE 1 TABLET ONCE DAILY. 90 tablet 3  . zolpidem (AMBIEN) 10 MG tablet Take 1 tablet (10 mg total) by mouth at bedtime as needed for sleep. 30 tablet 3   No current facility-administered medications for this encounter.    Vitals:   10/31/16 1448  BP: 132/90  Pulse: 69  SpO2: 98%  Weight: 186 lb 8 oz (84.6 kg)     PHYSICAL EXAM: General: Appears younger than stated age.   HEENT: normal Neck: supple. JVP flat. Carotids 2+ bilaterally; no bruits. No thyromegaly or nodule noted.  Cor: PMI normal. Irregular, tachy. Lungs: CTAB, normal effort Abdomen: soft, NT, ND, no HSM. No bruits or masses. +BS  Extremities: no cyanosis, clubbing, rash, edema Neuro: alert & orientedx3, cranial nerves grossly intact. Moves all 4 extremities w/o difficulty. Affect pleasant.  ECG: Afib RVR 154 bpm, Low voltage, Incomplete RBBB  ASSESSMENT & PLAN:  1) Afib RVR with Hx  of Atrial arrhythmias s/p aflutter ablation. With PAF and PAFL.  - Has had history of recurrent AFL despite DC-CV (x 2 in 2013 and x 1 in 2015), now, as above s/p AFL ablation by Dr Rayann Heman. Long history of AF/AFL by EKGs - She is back in Afib again despite DCCV at end of 09/22/16. - Continue Xarelto 20 mg daily. - In past ? component of OSA, Though she has refused sleep study.  - Pt presented today with recurrent Afib.  Give 5 mg of lopressor IV now.  2) Hx of DVT and PEs - s/p long flights in 2015 - Has not missed any Xarelto     Will give 5 mg of lopressor IV now. Will admit with marked RVR.  Will likely need diltiazem gtt on admission.  Will have EP see.  For alternate rhythm control  strategy.  ?Tikosyn   Shirley Friar PA-C 3:03 PM 10/31/2016    Patient seen and examined with [name], PA-C. We discussed all aspects of the encounter. I agree with the assessment and plan as stated above.   She has recurrent AF with very fast VR. Admit for rate control followed by DC-CV.   Sissy Goetzke,MD 11:11 PM

## 2016-10-31 NOTE — Progress Notes (Signed)
24g inserted in RAC per MD order

## 2016-11-01 ENCOUNTER — Encounter (HOSPITAL_COMMUNITY): Payer: Self-pay

## 2016-11-01 LAB — BASIC METABOLIC PANEL
Anion gap: 11 (ref 5–15)
BUN: 20 mg/dL (ref 6–20)
CALCIUM: 9.2 mg/dL (ref 8.9–10.3)
CHLORIDE: 106 mmol/L (ref 101–111)
CO2: 21 mmol/L — ABNORMAL LOW (ref 22–32)
CREATININE: 0.91 mg/dL (ref 0.44–1.00)
GFR calc Af Amer: >60 mL/min (ref >60)
GFR calc non Af Amer: 60 mL/min — ABNORMAL LOW (ref >60)
Glucose, Bld: 86 mg/dL (ref 65–99)
Potassium: 4.5 mmol/L (ref 3.5–5.1)
SODIUM: 138 mmol/L (ref 135–145)

## 2016-11-01 MED ORDER — SODIUM CHLORIDE 0.9% FLUSH: 3.0000 mL | Freq: Two times a day (BID) | INTRAVENOUS | Status: DC

## 2016-11-01 MED ORDER — METOPROLOL TARTRATE 5 MG/5ML IV SOLN: 5.0000 mg | Freq: Three times a day (TID) | INTRAVENOUS | Status: DC

## 2016-11-01 MED ORDER — MAGNESIUM SULFATE 2 GM/50ML IV SOLN
2.0000 g | Freq: Once | INTRAVENOUS | Status: AC
Start: 2016-11-01 — End: 2016-11-01
  Administered 2016-11-01: 2 g via INTRAVENOUS
  Filled 2016-11-01: qty 50

## 2016-11-01 MED ORDER — SODIUM CHLORIDE 0.9 % IV SOLN: 250.0000 mL | INTRAVENOUS | Status: DC

## 2016-11-01 MED ORDER — METOPROLOL TARTRATE 5 MG/5ML IV SOLN
5.0000 mg | Freq: Once | INTRAVENOUS | Status: AC
  Administered 2016-11-01: 5 mg via INTRAVENOUS
  Filled 2016-11-01: qty 5

## 2016-11-01 MED ORDER — SODIUM CHLORIDE 0.9% FLUSH: 3.0000 mL | INTRAVENOUS | Status: DC | PRN

## 2016-11-01 NOTE — Progress Notes (Signed)
Advanced Heart Failure Rounding Note   Subjective:    Admitted from HF clinic with recurrent A fib with RVR. Given lopressor and started on flecainide and diltiazem drip. Remains in 110-120s.    Mild dyspnea with exertion.    Objective:   Weight Range:  Vital Signs:   Temp:  [97.5 F (36.4 C)-98.1 F (36.7 C)] 98.1 F (36.7 C) (11/08 0723) Pulse Rate:  [69-131] 131 (11/08 0723) Resp:  [19-25] 25 (11/08 0723) BP: (112-121)/(80-99) 121/90 (11/08 0723) SpO2:  [94 %-99 %] 94 % (11/08 0723) Weight:  [186 lb 6.4 oz (84.6 kg)] 186 lb 6.4 oz (84.6 kg) (11/07 1558)    Weight change: Filed Weights   10/31/16 1558  Weight: 186 lb 6.4 oz (84.6 kg)    Intake/Output:   Intake/Output Summary (Last 24 hours) at 11/01/16 0835 Last data filed at 11/01/16 0400  Gross per 24 hour  Intake           140.33 ml  Output                0 ml  Net           140.33 ml     Physical Exam: General:  Well appearing. No resp difficulty. Walking in the room  HEENT: normal Neck: supple. JVP 6-7 . Carotids 2+ bilat; no bruits. No lymphadenopathy or thryomegaly appreciated. Cor: PMI nondisplaced. Irregular rate & rhythm. No rubs, gallops or murmurs. Lungs: clear Abdomen: soft, nontender, nondistended. No hepatosplenomegaly. No bruits or masses. Good bowel sounds. Extremities: no cyanosis, clubbing, rash, edema Neuro: alert & orientedx3, cranial nerves grossly intact. moves all 4 extremities w/o difficulty. Affect pleasant  Telemetry:  Afib 110-120s   Labs: Basic Metabolic Panel:  Recent Labs Lab 10/31/16 1647 11/01/16 0436  NA 137 138  K 4.5 4.5  CL 105 106  CO2 23 21*  GLUCOSE 104* 86  BUN 22* 20  CREATININE 1.08* 0.91  CALCIUM 9.3 9.2  MG 1.9  --     Liver Function Tests:  Recent Labs Lab 10/31/16 1647  AST 25  ALT 31  ALKPHOS 70  BILITOT 1.2  PROT 6.3*  ALBUMIN 3.6   No results for input(s): LIPASE, AMYLASE in the last 168 hours. No results for input(s): AMMONIA  in the last 168 hours.  CBC:  Recent Labs Lab 10/31/16 1647  WBC 6.6  NEUTROABS 4.9  HGB 13.0  HCT 39.8  MCV 89.6  PLT 231    Cardiac Enzymes: No results for input(s): CKTOTAL, CKMB, CKMBINDEX, TROPONINI in the last 168 hours.  BNP: BNP (last 3 results)  Recent Labs  10/31/16 1647  BNP 277.4*    ProBNP (last 3 results) No results for input(s): PROBNP in the last 8760 hours.    Other results:  Imaging:  No results found.   Medications:     Scheduled Medications: . flecainide  100 mg Oral Q12H  . levothyroxine  25 mcg Oral Once per day on Sun Mon Wed Fri   And  . [START ON 11/02/2016] levothyroxine  50 mcg Oral Once per day on Tue Thu Sat  . mirabegron ER  50 mg Oral Daily  . rivaroxaban  20 mg Oral Q supper  . sodium chloride flush  3 mL Intravenous Q12H     Infusions: . diltiazem (CARDIZEM) infusion 15 mg/hr (11/01/16 0636)     PRN Medications:  sodium chloride, acetaminophen, ondansetron (ZOFRAN) IV, sodium chloride flush, zolpidem   Assessment/Plan/Discussion  [name] is a 75 y.o. female with hx of atrial arrhythmias s/p a flutter ablation with continued PAF and PAFL, as well as hx of DVT and PEs. She presented to clinic as add on with worsening SOB and tachy-palpitations. Found to be in Afib RVR with rates in 180s. Admitted for rhythm control and EP evaluation.   1) Afib RVR with Hx of Atrial arrhythmias s/p aflutter ablation. With PAF and PAFL.  - Has had history of recurrent AFL despite DC-CV (x 2 in 2013 and x 1 in 2015), now, as above s/p AFL ablation by Dr Rayann Heman. Long history of AF/AFL by EKGs - She is back in Afib again despite DCCV at end of 09/22/16. - Continue Xarelto 20 mg daily. On diltiazem drip + flecanide. Dr Haroldine Laws discussed with EP.  - In past ? component of OSA, Though she has refused sleep study.  - -This patients CHA2DS2-VASc Score and unadjusted Ischemic Stroke Rate (% per year) is equal to 3 (Age > 74,  Female) 2) Hx of DVT and PEs - s/p long flights in 2015 - Has not missed any Xarelto  3) Hypothyroid- TSH 3.8 On Levothyroxine.   Repeat ECHO  Length of Stay: 1  Amy Clegg NP-C  11/01/2016, 8:35 AM  Advanced Heart Failure Team Pager [phone] (M-F; 7a - 4p)  Please contact [facility] for night-coverage after hours (4p -7a ) and weekends on [email or web address]  Patient seen and examined with Darrick Grinder, NP. We discussed all aspects of the encounter. I agree with the assessment and plan as stated above.   She remains in AF. Rate was fast this am but improved with addition of IV metoprolol. Will continue flecainide plan DC-CV in am. Continue Xarelto. Repeat echo.   D/w Dr. Rayann Heman. If fails flecainide will consider AF ablation.   Garrie Elenes,MD 3:43 PM

## 2016-11-01 NOTE — Consult Note (Signed)
[facility] CM Primary Care Navigator  11/01/2016  [name] 10/02/1941 [id number]    Met with patient and husband (William) at the bedside to identify possible discharge needs. Patientreportsfeeling short of breath, "increased and racing heart rate" that had led to hospital admission.  Patient endorses Dr. Thomas Jones from [facility] [place] as her primary care provider.   Patient states[place] [facility] to obtain medications with no difficulty.  She mentioned managing her own medications at home using "pill box"system.   Patient reports being able to drive prior to admission. Her husband and two sons (Eric and Ethan) will be able to providetransportation to her doctors'appointments as stated.  Patient is independent with self care before this admission. Primary caregiver at home will be her husband.  Plan for discharge is home per patient.  Patient voiced concern of need to follow-up with PCP since she will also follow-up with cardiologist after discharge. Explained to patient and husband the importance and benefits of follow-up with primary care provider once discharged.  Both expressed understanding to set-up a schedule for a post discharge follow-up appointment within a week or sooner if needs arise.  Patient denies anyneeds or concerns at this time.    For additional questions please contact:  Lorraine A. Ajel, BSN, RN-BC [facility] PRIMARY CARE Navigator Cell: [phone] 

## 2016-11-01 NOTE — Consult Note (Deleted)
[facility] CM Primary Care Navigator  11/01/2016  [name] 09/01/1941 [id number]    Met with patient and husband ([name]) at the bedside to identify possible discharge needs. Patientreportsfeeling short of breath, "increased and racing heart rate" that had led to hospital admission.  Patient endorses Dr. Thomas Jones from [facility] [place] as her primary care provider.   Patient statesusing [facility] pharmacy to obtain medications with no difficulty.  She mentioned managing her own medications at home using "pill box"system.   Patient reports being able to drive prior to admission. Her husband and two sons ([name] and [name]) will be able to providetransportation to her doctors'appointments as stated.  Patient is independent with self care before this admission. Primary caregiver at home will be her husband.  Plan for discharge is home per patient.  Patient voiced concern of need to follow-up with PCP since she will also follow-up with cardiologist after discharge. Explained to patient and husband the importance and benefits of follow-up with primary care provider once discharged.  Both expressed understanding to set-up a schedule for a post discharge follow-up appointment within a week or sooner if needs arise.  Patient denies anyneeds or concerns at this time.    For additional questions please contact:  Clorinda Wyble A. Lei Dower, BSN, RN-BC [facility] PRIMARY CARE Navigator Cell: [phone]   [facility] CM Primary Care Navigator  11/01/2016  [name] June 13, 1941 [id number]   Met with patient and husband [name]) at the bedside to identify possible discharge needs. Patient reports feeling short of breath, "increased and racing heart rate" that had led to hospital admission.  Patient endorses Dr. Scarlette Calico from [facility] [place] as her primary care provider.   Patient states using [facility] pharmacy  to obtain medications with no difficulty.  She mentioned managing her own medications at home using "pill box" system.   Patient reports being able to drive prior to admission. Her husband and two sons [name] and [name]) will be able to provide transportation to her doctors' appointments as stated.  Patient is independent with self care before this admission. Primary caregiver at home will be her husband.  Plan for discharge is home per patient.  Patient voiced concern of need to follow-up with PCP since she will also follow-up with cardiologist after discharge. Explained to patient and husband the importance and benefits of follow-up with primary care provider once discharged.  Both expressed understanding to set-up a schedule for a post discharge follow-up appointment within a week or sooner if needs arise.  Patient denies any needs or concerns at this time.   For additional questions please contact:  Edwena Felty A. Chinenye Katzenberger, BSN, RN-BC [facility] PRIMARY CARE Navigator Cell: [phone]

## 2016-11-01 NOTE — Progress Notes (Signed)
Patient converted to NSR, EKG obtained rhythm confirmed SB at 53. Ignacia Bayley, NP notified, cardizem gtt stopped. Will continue to monitor. Wardell Honour, RN

## 2016-11-02 ENCOUNTER — Other Ambulatory Visit (HOSPITAL_COMMUNITY): Payer: Medicare Other

## 2016-11-02 ENCOUNTER — Encounter (HOSPITAL_COMMUNITY)
Admission: AD | Disposition: A | Discharge status: Home / Self Care | Payer: Self-pay | Source: Ambulatory Visit | Attending: Internal Medicine

## 2016-11-02 ENCOUNTER — Encounter (HOSPITAL_COMMUNITY): Payer: Self-pay | Admitting: Anesthesiology

## 2016-11-02 LAB — BASIC METABOLIC PANEL
Anion gap: 9 (ref 5–15)
BUN: 20 mg/dL (ref 6–20)
CHLORIDE: 104 mmol/L (ref 101–111)
CO2: 25 mmol/L (ref 22–32)
CREATININE: 1.13 mg/dL — AB (ref 0.44–1.00)
Calcium: 9.2 mg/dL (ref 8.9–10.3)
GFR calc Af Amer: 53 mL/min — ABNORMAL LOW (ref >60)
GFR calc non Af Amer: 46 mL/min — ABNORMAL LOW (ref >60)
GLUCOSE: 96 mg/dL (ref 65–99)
Potassium: 4.4 mmol/L (ref 3.5–5.1)
Sodium: 138 mmol/L (ref 135–145)

## 2016-11-02 LAB — MAGNESIUM: Magnesium: 2.2 mg/dL (ref 1.7–2.4)

## 2016-11-02 SURGERY — CARDIOVERSION
Anesthesia: Monitor Anesthesia Care

## 2016-11-02 MED ORDER — RIVAROXABAN 20 MG PO TABS: 20.0000 mg | ORAL_TABLET | Freq: Every day | ORAL | 6 refills | Status: DC

## 2016-11-02 MED ORDER — METOPROLOL TARTRATE 25 MG PO TABS: 12.5000 mg | ORAL_TABLET | Freq: Two times a day (BID) | ORAL | 6 refills | Status: DC

## 2016-11-02 MED ORDER — FLECAINIDE ACETATE 100 MG PO TABS: 100.0000 mg | ORAL_TABLET | Freq: Two times a day (BID) | ORAL | 6 refills | Status: DC

## 2016-11-02 MED ORDER — METOPROLOL TARTRATE 12.5 MG HALF TABLET: 12.5000 mg | ORAL_TABLET | Freq: Two times a day (BID) | ORAL | Status: DC

## 2016-11-02 NOTE — Discharge Summary (Signed)
Advanced Heart Failure Team  Discharge Summary   Patient ID: [name] MRN: [id number], DOB/AGE: Nov 25, 1941 75 y.o. Admit date: 10/31/2016 D/C date:     11/02/2016   Primary Discharge Diagnoses:    Hospital Course:  1) Afib RVR  2) Hx of DVT and PEs 3) Hypothyroidism  [name] a 75 y.o.femalewith hx of atrial arrhythmias s/p a flutter ablation with continued PAF and PAFL, as well as hx of DVT and PEs.   She presented to HF  Clinic for an acute visit with worsening SOB and tachy-palpitations. Found to be in Afib RVR with rates in 180s. Admitted for rhythm control and EP evaluation.   1) Afib RVR with Hx of Atrial arrhythmias s/p aflutter ablation. With PAF and PAFL.  - Has had history of recurrent AFL despite DC-CV (x 2 in 2013 and x 1 in 2015),  s/p AFL ablation by Dr Rayann Heman. Long history of AF/AFL by EKGs - On admit she was back in Afib again despite DCCV at end of 09/22/16. On admit placed on diltiazem drip + flecainide and also received IV metoprolol.  Chemically converted to NSR. Diltiazem stopped due to low heart rate, On the day discharge she was in a NSR in the 60s. .  - Continue Xarelto 20 mg daily. Continue  flecanide 100 mg twice a day and add 12.5 mg lopressor twice a day. - In past ? component of OSA, Though she has refused sleep study.  - -This patients CHA2DS2-VASc Score and unadjusted Ischemic Stroke Rate (% per year) is equal to 3 (Age > 83, Female) 2) Hx of DVT and PEs - s/p long flights in 2015 - Has not missed any Xarelto  - continue Xarelto.  3) Hypothyroid- TSH 3.8 On Levothyroxine.   We have set up treadmill test as an outpatient while on flecainide to observe for arrhythmias. Follow up with Dr Haroldine Laws in a few weeks.   Discharge Vitals: Blood pressure 139/88, pulse 69, temperature 98.2 F (36.8 C), temperature source Oral, resp. rate (!) 26, height 5\' 6"  (1.676 m), weight 185 lb (83.9 kg), SpO2 96 %.  Labs: Lab Results  Component Value  Date   WBC 6.6 10/31/2016   HGB 13.0 10/31/2016   HCT 39.8 10/31/2016   MCV 89.6 10/31/2016   PLT 231 10/31/2016    Recent Labs Lab 10/31/16 1647  11/02/16 0329  NA 137  < > 138  K 4.5  < > 4.4  CL 105  < > 104  CO2 23  < > 25  BUN 22*  < > 20  CREATININE 1.08*  < > 1.13*  CALCIUM 9.3  < > 9.2  PROT 6.3*  --   --   BILITOT 1.2  --   --   ALKPHOS 70  --   --   ALT 31  --   --   AST 25  --   --   GLUCOSE 104*  < > 96  < > = values in this interval not displayed. Lab Results  Component Value Date   CHOL 192 05/16/2016   HDL 61.80 05/16/2016   LDLCALC 118 (H) 05/16/2016   TRIG 62.0 05/16/2016   BNP (last 3 results)  Recent Labs  10/31/16 1647  BNP 277.4*    ProBNP (last 3 results) No results for input(s): PROBNP in the last 8760 hours.   Diagnostic Studies/Procedures   No results found.  Discharge Medications     Medication List  TAKE these medications   flecainide 100 MG tablet Commonly known as:  TAMBOCOR Take 1 tablet (100 mg total) by mouth every 12 (twelve) hours.   levothyroxine 50 MCG tablet Commonly known as:  SYNTHROID, LEVOTHROID TAKE 1/2 TABLET DAILY, EXCEPT 1 TABLET ON TUESDAY, THURSDAY AND SATURDAY.   metoprolol tartrate 25 MG tablet Commonly known as:  LOPRESSOR Take 0.5 tablets (12.5 mg total) by mouth 2 (two) times daily.   rivaroxaban 20 MG Tabs tablet Commonly known as:  XARELTO Take 1 tablet (20 mg total) by mouth daily with supper. What changed:  See the new instructions.   zolpidem 10 MG tablet Commonly known as:  AMBIEN Take 1 tablet (10 mg total) by mouth at bedtime as needed for sleep.       Disposition   The patient will be discharged in stable condition to home. Discharge Instructions    Diet - low sodium heart healthy    Complete by:  As directed    Increase activity slowly    Complete by:  As directed      Follow-up Information    Glori Bickers, MD Follow up on 11/23/2016.   Specialty:   Cardiology Why:  at 2:40 [place] information: [place] [address] [phone]        Glori Bickers, MD Follow up on 11/14/2016.   Specialty:  Cardiology Why:  For Treadmill Test . At 11:00. Arrive at [employer] information: [address] [place] [address] [phone]             Duration of Discharge Encounter: Greater than 35 minutes   Signed, Darrick Grinder NP-C  11/02/2016, 1:35 PM   Patient seen and examined with Darrick Grinder, NP. We discussed all aspects of the encounter. I agree with the assessment and plan as stated above.   Patient admitted with recurrent AF with RVR. Converted with flecainide. Continue Xarelto. Case discussed with Dr. Rayann Heman and if she fails flecainide will consider AF ablation. ETT scheduled to rule-out pro-arrhythmia effect.   Dewie Ahart,MD 2:47 PM

## 2016-11-02 NOTE — Progress Notes (Signed)
Advanced Heart Failure Rounding Note   Subjective:    Admitted from HF clinic with recurrent A fib with RVR. Given lopressor and started on flecainide and diltiazem drip.   Over night converted to NSR.   Denies SOB.       Objective:   Weight Range:  Vital Signs:   Temp:  [97.7 F (36.5 C)-98.2 F (36.8 C)] 98.2 F (36.8 C) (11/09 0740) Pulse Rate:  [46-94] 69 (11/09 0740) Resp:  [18-27] 26 (11/09 0740) BP: (103-139)/(68-92) 139/88 (11/09 0740) SpO2:  [95 %-97 %] 96 % (11/09 0740) Weight:  [185 lb (83.9 kg)] 185 lb (83.9 kg) (11/09 0541) Last BM Date: 11/01/16  Weight change: Filed Weights   10/31/16 1558 11/02/16 0541  Weight: 186 lb 6.4 oz (84.6 kg) 185 lb (83.9 kg)    Intake/Output:   Intake/Output Summary (Last 24 hours) at 11/02/16 0908 Last data filed at 11/01/16 1819  Gross per 24 hour  Intake              120 ml  Output                0 ml  Net              120 ml     Physical Exam: General:  Well appearing. No resp difficulty. Walking in the room  HEENT: normal Neck: supple. JVP 6-7 . Carotids 2+ bilat; no bruits. No lymphadenopathy or thryomegaly appreciated. Cor: PMI nondisplaced. Regular rate & rhythm. No rubs, gallops or murmurs. Lungs: clear Abdomen: soft, nontender, nondistended. No hepatosplenomegaly. No bruits or masses. Good bowel sounds. Extremities: no cyanosis, clubbing, rash, edema Neuro: alert & orientedx3, cranial nerves grossly intact. moves all 4 extremities w/o difficulty. Affect pleasant  Telemetry:  NSR 60s    Labs: Basic Metabolic Panel:  Recent Labs Lab 10/31/16 1647 11/01/16 0436 11/02/16 0329  NA 137 138 138  K 4.5 4.5 4.4  CL 105 106 104  CO2 23 21* 25  GLUCOSE 104* 86 96  BUN 22* 20 20  CREATININE 1.08* 0.91 1.13*  CALCIUM 9.3 9.2 9.2  MG 1.9  --  2.2    Liver Function Tests:  Recent Labs Lab 10/31/16 1647  AST 25  ALT 31  ALKPHOS 70  BILITOT 1.2  PROT 6.3*  ALBUMIN 3.6   No results for  input(s): LIPASE, AMYLASE in the last 168 hours. No results for input(s): AMMONIA in the last 168 hours.  CBC:  Recent Labs Lab 10/31/16 1647  WBC 6.6  NEUTROABS 4.9  HGB 13.0  HCT 39.8  MCV 89.6  PLT 231    Cardiac Enzymes: No results for input(s): CKTOTAL, CKMB, CKMBINDEX, TROPONINI in the last 168 hours.  BNP: BNP (last 3 results)  Recent Labs  10/31/16 1647  BNP 277.4*    ProBNP (last 3 results) No results for input(s): PROBNP in the last 8760 hours.    Other results:  Imaging: No results found.   Medications:     Scheduled Medications: . flecainide  100 mg Oral Q12H  . levothyroxine  25 mcg Oral Once per day on Sun Mon Wed Fri   And  . levothyroxine  50 mcg Oral Once per day on Tue Thu Sat  . metoprolol  5 mg Intravenous Q8H  . mirabegron ER  50 mg Oral Daily  . rivaroxaban  20 mg Oral Q supper  . sodium chloride flush  3 mL Intravenous Q12H  . sodium chloride flush  3 mL Intravenous Q12H    Infusions: . sodium chloride Stopped (11/01/16 1701)    PRN Medications: sodium chloride, acetaminophen, ondansetron (ZOFRAN) IV, sodium chloride flush, sodium chloride flush, zolpidem   Assessment/Plan/Discussion   [name] is a 75 y.o. female with hx of atrial arrhythmias s/p a flutter ablation with continued PAF and PAFL, as well as hx of DVT and PEs. She presented to clinic as add on with worsening SOB and tachy-palpitations. Found to be in Afib RVR with rates in 180s. Admitted for rhythm control and EP evaluation.   1) Afib RVR with Hx of Atrial arrhythmias s/p aflutter ablation. With PAF and PAFL.  - Has had history of recurrent AFL despite DC-CV (x 2 in 2013 and x 1 in 2015), now, as above s/p AFL ablation by Dr Rayann Heman. Long history of AF/AFL by EKGs - On admit she was back in Afib again despite DCCV at end of 09/22/16. Chemically converted to NSR. Off diltiazem heart rate went down to the 40s.  - Continue Xarelto 20 mg daily.  -Continue   flecanide 100 mg twice a day and add 12.5 mg lopressor twice a day. - In past ? component of OSA, Though she has refused sleep study.  - -This patients CHA2DS2-VASc Score and unadjusted Ischemic Stroke Rate (% per year) is equal to 3 (Age > 24, Female) 2) Hx of DVT and PEs - s/p long flights in 2015 - Has not missed any Xarelto  3) Hypothyroid- TSH 3.8 On Levothyroxine.    Set up treadmill test at [facility] D/C home   Length of Stay: 2  Amy Clegg NP-C  11/02/2016, 9:08 AM  Advanced Heart Failure Team Pager [phone] (M-F; 7a - 4p)  Please contact [facility] for night-coverage after hours (4p -7a ) and weekends on [email or web address]   Patient seen and examined with Darrick Grinder, NP. We discussed all aspects of the encounter. I agree with the assessment and plan as stated above.   Back in NSR on flecainide. Can go home today. Continue flecainide and low dose metoprolol. Arrange ETT next week or 2 to rule out proarrhythmia. If AF recurs on flecainide will need AF ablation. D/W Dr. Rayann Heman.   Seon Gaertner,MD 11:54 AM

## 2016-11-03 ENCOUNTER — Telehealth: Payer: Self-pay | Admitting: *Deleted

## 2016-11-03 NOTE — Telephone Encounter (Signed)
Pt is on TCM list admitted for A-fib. Pt was d/c 11/9, and will be f/u w/cardiologist Dr. Haroldine Laws on 11/23/16...Johny Chess

## 2016-11-14 ENCOUNTER — Encounter (INDEPENDENT_AMBULATORY_CARE_PROVIDER_SITE_OTHER): Payer: Medicare Other

## 2016-11-14 DIAGNOSIS — I4891 Unspecified atrial fibrillation: Secondary | ICD-10-CM | POA: Diagnosis not present

## 2016-11-14 LAB — EXERCISE TOLERANCE TEST
CHL CUP MPHR: 144 {beats}/min
CHL CUP STRESS STAGE 1 GRADE: 0 %
CHL CUP STRESS STAGE 1 HR: 86 {beats}/min
CHL CUP STRESS STAGE 1 SBP: 168 mmHg
CHL CUP STRESS STAGE 1 SPEED: 0 mph
CHL CUP STRESS STAGE 2 GRADE: 0 %
CHL CUP STRESS STAGE 2 HR: 63 {beats}/min
CHL CUP STRESS STAGE 2 SPEED: 1 mph
CHL CUP STRESS STAGE 4 GRADE: 10 %
CHL CUP STRESS STAGE 4 HR: 74 {beats}/min
CHL CUP STRESS STAGE 5 GRADE: 12 %
CHL CUP STRESS STAGE 5 HR: 77 {beats}/min
CHL CUP STRESS STAGE 5 SPEED: 2.5 mph
CHL CUP STRESS STAGE 6 DBP: 90 mmHg
CHL CUP STRESS STAGE 6 HR: 62 {beats}/min
CHL CUP STRESS STAGE 7 HR: 48 {beats}/min
CSEPED: 3 min
CSEPEDS: 24 s
CSEPEW: 5 METS
CSEPPMHR: 53 %
Peak HR: 77 {beats}/min
RPE: 17
Rest HR: 60 {beats}/min
Stage 1 DBP: 92 mmHg
Stage 3 Grade: 0 %
Stage 3 HR: 63 {beats}/min
Stage 3 Speed: 1 mph
Stage 4 Speed: 1.7 mph
Stage 6 Grade: 0 %
Stage 6 SBP: 179 mmHg
Stage 6 Speed: 0 mph
Stage 7 DBP: 87 mmHg
Stage 7 Grade: 0 %
Stage 7 SBP: 162 mmHg
Stage 7 Speed: 0 mph

## 2016-11-23 ENCOUNTER — Encounter (HOSPITAL_COMMUNITY): Payer: Self-pay | Admitting: Internal Medicine

## 2016-11-23 ENCOUNTER — Ambulatory Visit (HOSPITAL_COMMUNITY)
Admission: RE | Admit: 2016-11-23 | Discharge: 2016-11-23 | Disposition: A | Discharge status: Home / Self Care | Payer: Medicare Other | Source: Ambulatory Visit | Attending: Internal Medicine | Admitting: Internal Medicine

## 2016-11-23 VITALS — BP 124/72 | HR 65 | Wt 180.2 lb

## 2016-11-23 DIAGNOSIS — Z7901 Long term (current) use of anticoagulants: Secondary | ICD-10-CM | POA: Diagnosis not present

## 2016-11-23 DIAGNOSIS — I440 Atrioventricular block, first degree: Secondary | ICD-10-CM | POA: Insufficient documentation

## 2016-11-23 DIAGNOSIS — Z9889 Other specified postprocedural states: Secondary | ICD-10-CM | POA: Diagnosis not present

## 2016-11-23 DIAGNOSIS — I4510 Unspecified right bundle-branch block: Secondary | ICD-10-CM | POA: Diagnosis not present

## 2016-11-23 DIAGNOSIS — Z79899 Other long term (current) drug therapy: Secondary | ICD-10-CM | POA: Insufficient documentation

## 2016-11-23 DIAGNOSIS — Z86718 Personal history of other venous thrombosis and embolism: Secondary | ICD-10-CM | POA: Diagnosis not present

## 2016-11-23 DIAGNOSIS — I480 Paroxysmal atrial fibrillation: Secondary | ICD-10-CM | POA: Diagnosis not present

## 2016-11-23 DIAGNOSIS — R001 Bradycardia, unspecified: Secondary | ICD-10-CM | POA: Diagnosis not present

## 2016-11-23 NOTE — Patient Instructions (Signed)
ECG in 2-3 weeks  Follow up in 6 months

## 2016-11-23 NOTE — Progress Notes (Signed)
Patient ID: [name], female   DOB: Oct 24, 1941, 75 y.o.   MRN: [id number]    Advanced Heart Failure Clinic Note   HPI:  [name] is a 75 y/o woman with h/o of PAF and AFL s/p AFL ablation.   She was admitted in 11/13 with acute bilateral PE, rapid AF and heart failure after a long plane trip. .EF 40-45%.  On 10/28/12 Unsuccessful DCCV . Loaded amio and underwent repeat DC-CV on 11/13. Maintained on Xarelto. In 11/13 had AFL.  Amio increased to 400 bid. On 12/10/12 successful DC-CV.  Amiodarone was eventually stopped.   04/22/13 ECHO EF 65%, mild LVH, normal RV size and systolic function, PA systolic pressure 35 mmHg .Had recurrent AF in 9/15. Amiodarone was restarted and she was cardioverted to NSR on 09/14/14. Seen again on Oct 16. Asymptomatic. Was back in AFL with RVR at 150. Underwent DC-CV but reverted to AFL. S/p A flutter ablation 12/15/2014. Was seen by Dr Rayann Heman in follow up last 01/08/15 and was stable at that time with no symptoms. She was in NSR at that time. Metoprolol stopped.   Admitted earlier this month with recurrent AF with RVR. Admitted. Placed on po flecainide and converted. Has been maintaining NSR. Feeling well. No edema or dyspnea on exertion.  ETT normal.  No bleeding on Xarelto.  Has not missed any doses.   ECG: Afib RVR 185 bpm, Low voltage QRS  Labs (7/15): K 4.2, creatinine 0.8  ROS: All systems negative except as listed in HPI, PMH and Problem List.  Past Medical History:  Diagnosis Date  . Atrial flutter ([place])    a. s/p CTI ablation 11/2014  . Diverticulosis 2002   Dr. Fuller Plan  . DVT (deep venous thrombosis) ([place]) 2013   "S/P a long airplane ride"  . Fasting hyperglycemia    A1C  within normal limits  . Hyperlipidemia   . Pancreatitis due to common bile duct stone   . Paroxysmal atrial fibrillation ([facility])   . Personal history of colonic polyps 2002   tubular adenoma  . Pulmonary embolism ([place]) 2013   "S/P a long airplane ride"  . Tortuous colon    . Vitamin D deficiency     Current Outpatient Prescriptions  Medication Sig Dispense Refill  . flecainide (TAMBOCOR) 100 MG tablet Take 1 tablet (100 mg total) by mouth every 12 (twelve) hours. 60 tablet 6  . levothyroxine (SYNTHROID, LEVOTHROID) 50 MCG tablet TAKE 1/2 TABLET DAILY, EXCEPT 1 TABLET ON TUESDAY, THURSDAY AND SATURDAY. 64 tablet 3  . metoprolol tartrate (LOPRESSOR) 25 MG tablet Take 0.5 tablets (12.5 mg total) by mouth 2 (two) times daily. 30 tablet 6  . rivaroxaban (XARELTO) 20 MG TABS tablet Take 1 tablet (20 mg total) by mouth daily with supper. 30 tablet 6  . zolpidem (AMBIEN) 10 MG tablet Take 1 tablet (10 mg total) by mouth at bedtime as needed for sleep. 30 tablet 3   No current facility-administered medications for this encounter.    Vitals:   11/23/16 1436  BP: 124/72  Pulse: 65  SpO2: 98%  Weight: 180 lb 4 oz (81.8 kg)     PHYSICAL EXAM: General: NAD HEENT: normal Neck: supple. JVP flat. Carotids 2+ bilaterally; no bruits. No thyromegaly or nodule noted.  Cor: PMI normal.RRR Lungs: CTAB, normal effort Abdomen: soft, NT, ND, no HSM. No bruits or masses. +BS  Extremities: no cyanosis, clubbing, rash, edema Neuro: alert & orientedx3, cranial nerves grossly intact. Moves all 4 extremities  w/o difficulty. Affect pleasant.  ECG: Sinus brady 58. 1AVB 258ms (previously 175ms) iRBBB  ASSESSMENT & PLAN:  1) Paroxysmal Afib RVR  s/p aflutter ablation. .  - Has had history of recurrent AFL despite DC-CV (x 2 in 2013 and x 1 in 2015), now, as above s/p AFL ablation by Dr Rayann Heman. Long history of AF/AFL by EKGs -  Afib s/p DCCV in 09/22/16. - Recurrent AF in 11/17 now NSR on flecainide 2) Hx of DVT and PEs - s/p long flights in 2015 - Has not missed any Xarelto    Overall doing well. Maintaining NSR on flecainide. PR interval slightly prolonged with flecainide. Will continue current dose. Repeat ECG in 2-3 weeks. If PR interval continues to widen can decrease  flecainide to 75 bid. If has recurrent AF on flecainide will refer back to Dr. Rayann Heman for AF ablation.   Glori Bickers MD 2:59 PM 11/23/2016

## 2016-12-14 ENCOUNTER — Ambulatory Visit (HOSPITAL_COMMUNITY)
Admission: RE | Admit: 2016-12-14 | Discharge: 2016-12-14 | Disposition: A | Discharge status: Home / Self Care | Payer: Medicare Other | Source: Ambulatory Visit | Attending: Internal Medicine | Admitting: Internal Medicine

## 2016-12-14 DIAGNOSIS — R001 Bradycardia, unspecified: Secondary | ICD-10-CM | POA: Diagnosis not present

## 2016-12-14 DIAGNOSIS — I4510 Unspecified right bundle-branch block: Secondary | ICD-10-CM | POA: Insufficient documentation

## 2016-12-14 DIAGNOSIS — I440 Atrioventricular block, first degree: Secondary | ICD-10-CM | POA: Insufficient documentation

## 2016-12-14 DIAGNOSIS — I4891 Unspecified atrial fibrillation: Secondary | ICD-10-CM | POA: Diagnosis not present

## 2016-12-14 NOTE — Patient Instructions (Signed)
Follow up in 6 months 

## 2016-12-20 ENCOUNTER — Telehealth: Payer: Self-pay | Admitting: Internal Medicine

## 2016-12-20 NOTE — Telephone Encounter (Signed)
Spoke with [facility] and let her know we have gotten the fax.  She will resend

## 2016-12-20 NOTE — Telephone Encounter (Signed)
Follow Up:    [name] from [facility] to know if Dr Rayann Heman have received the Query packet on [name]? It was faxed on 12-01-16.

## 2016-12-28 ENCOUNTER — Telehealth: Payer: Self-pay | Admitting: Internal Medicine

## 2016-12-28 NOTE — Telephone Encounter (Signed)
Still have not received the fax and they are going to refax again

## 2016-12-28 NOTE — Telephone Encounter (Signed)
°  Follow Up   Calling to follow up on fax regarding additional coding of pts diagnosis faxed over on 12/21/16. Verifying fax was received. Please call.

## 2016-12-29 NOTE — Telephone Encounter (Signed)
Signed and re faxed

## 2017-02-05 ENCOUNTER — Encounter: Payer: Self-pay | Admitting: Gastroenterology

## 2017-02-19 ENCOUNTER — Other Ambulatory Visit: Payer: Self-pay | Admitting: Internal Medicine

## 2017-02-19 DIAGNOSIS — Z1231 Encounter for screening mammogram for malignant neoplasm of breast: Secondary | ICD-10-CM

## 2017-03-28 ENCOUNTER — Other Ambulatory Visit (HOSPITAL_COMMUNITY): Payer: Self-pay | Admitting: Adult Health

## 2017-04-04 ENCOUNTER — Other Ambulatory Visit (HOSPITAL_COMMUNITY): Payer: Self-pay | Admitting: Internal Medicine

## 2017-04-05 ENCOUNTER — Ambulatory Visit (INDEPENDENT_AMBULATORY_CARE_PROVIDER_SITE_OTHER): Payer: Medicare Other | Admitting: Physician Assistant

## 2017-04-05 ENCOUNTER — Encounter: Payer: Self-pay | Admitting: Physician Assistant

## 2017-04-05 ENCOUNTER — Telehealth: Payer: Self-pay | Admitting: Emergency Medicine

## 2017-04-05 ENCOUNTER — Encounter: Payer: Self-pay | Admitting: Gastroenterology

## 2017-04-05 VITALS — BP 130/70 | HR 60 | Ht 66.0 in | Wt 178.0 lb

## 2017-04-05 DIAGNOSIS — Z7901 Long term (current) use of anticoagulants: Secondary | ICD-10-CM | POA: Diagnosis not present

## 2017-04-05 DIAGNOSIS — Z86010 Personal history of colonic polyps: Secondary | ICD-10-CM | POA: Diagnosis not present

## 2017-04-05 MED ORDER — PEG-KCL-NACL-NASULF-NA ASC-C 100 G PO SOLR
1.0000 | Freq: Once | ORAL | 0 refills | Status: AC
Start: 2017-04-05 — End: 2017-04-05

## 2017-04-05 NOTE — Patient Instructions (Signed)

## 2017-04-05 NOTE — Progress Notes (Signed)
Reviewed and agree with management plan.  Tambria Pfannenstiel T. Haislee Corso, MD FACG 

## 2017-04-05 NOTE — Telephone Encounter (Signed)
JONNI OELKERS 26-Jun-1941 [id number]  Dear Dr. Sung Amabile:  We have scheduled the above named patient for a(n) colonoscopy procedure. Our records show that (s)he is on anticoagulation therapy.  Please advise as to whether the patient may come of their therapy of Xarelto 1 day prior to their procedure which is scheduled for 04-16-17.  Please route your response to Tinnie Gens, CMA or fax response to [phone].  Sincerely,  Tinnie Gens, [facility]

## 2017-04-05 NOTE — Progress Notes (Signed)
Subjective:    Patient ID: [name], female    DOB: 03-21-1941, 76 y.o.   MRN: [id number]  HPI [name] is a very nice 76 year old white female known to Dr. Fuller Plan who comes in today to discuss follow-up colonoscopy in setting of chronic anticoagulation. She has history of adenomatous colon polyps. Last colonoscopy in April 2013 with finding of no polyps, she does have moderate diverticulosis. She was recommended for 5 year interval follow-up. Patient does have history of atrial fibrillation, is maintained on Xarelto. She is followed by Dr. Missy Sabins. She says she had an episode with atrial fibrillation in the fall but has been doing well over the past several months. Other medical problems include remote history of pulmonary embolism which was provoked. Patient has no current GI complaints. Specifically no complaints of abdominal discomfort change in bowel habits melena or hematochezia.  Review of Systems Pertinent positive and negative review of systems were noted in the above HPI section.  All other review of systems was otherwise negative.  Outpatient Encounter Prescriptions as of 04/05/2017  Medication Sig  . flecainide (TAMBOCOR) 100 MG tablet TAKE ONE TABLET EVERY TWELVE HOURS.  Marland Kitchen levothyroxine (SYNTHROID, LEVOTHROID) 50 MCG tablet TAKE 1/2 TABLET DAILY, EXCEPT 1 TABLET ON TUESDAY, THURSDAY AND SATURDAY.  . metoprolol tartrate (LOPRESSOR) 25 MG tablet TAKE (1/2) TABLET TWICE DAILY.  . rivaroxaban (XARELTO) 20 MG TABS tablet Take 1 tablet (20 mg total) by mouth daily with supper.  . peg 3350 powder (MOVIPREP) 100 g SOLR Take 1 kit (200 g total) by mouth once.  . [DISCONTINUED] zolpidem (AMBIEN) 10 MG tablet Take 1 tablet (10 mg total) by mouth at bedtime as needed for sleep.   No facility-administered encounter medications on file as of 04/05/2017.    Allergies  Allergen Reactions  . Meperidine Hcl Rash  . Penicillins Rash    Rash as child   Patient Active Problem List   Diagnosis Date Noted  . Atrial fibrillation with RVR ([place]) 10/31/2016  . Atrial fibrillation with rapid ventricular response ([place]) 09/21/2016  . History of pulmonary embolus (PE) 09/21/2016  . Insomnia due to anxiety and fear 08/22/2016  . Routine general medical examination at a health care facility 07/16/2016  . OAB (overactive bladder) 07/13/2016  . Estrogen deficiency 07/13/2016  . Atrial flutter, paroxysmal ([place]) 12/15/2014  . Obesity (BMI 30-39.9) 07/09/2014  . Hypothyroidism 09/01/2013  . Atrial fibrillation ([place]) 11/04/2012  . Anticoagulation management encounter 10/25/2012  . Vitamin D deficiency 03/02/2009  . Disturbance in sleep behavior 03/02/2009  . Hyperlipidemia 03/13/2008  . DEGENERATIVE JOINT DISEASE, ADVANCED 03/13/2008   Social History   Social History  . Marital status: Married    Spouse name: N/A  . Number of children: 4  . Years of education: N/A   Occupational History  . retired     Therapist, sports   Social History Main Topics  . Smoking status: Never Smoker  . Smokeless tobacco: Never Used  . Alcohol use No     Comment:  2-3 /mo  . Drug use: No  . Sexual activity: No   Other Topics Concern  . Not on file   Social History Narrative  . No narrative on file    [name]'s family history includes Alcohol abuse in her brother; Cancer in her brother and father; Colon cancer in her brother; Hypertension in her mother; Lung cancer in her maternal uncle; Stroke (age of onset: 12) in her mother.      Objective:  Vitals:   04/05/17 0840  BP: 130/70  Pulse: 60    Physical Exam  well-developed older white female in no acute distress, quite pleasant blood pressure 130/70 pulse 60, Height 5 foot 6, weight 178, BMI 28.7. HEENT; nontraumatic normocephalic EOMI PERRLA sclera anicteric, Cardiovascular; regular rate and rhythm with S1-S2 soft murmur, Pulmonary; clear bilaterally, Abdomen; soft, bowel sounds are present she is nontender there is no palpable mass or  hepatosplenomegaly, Rectal ;exam not done, Extremities ;no clubbing cyanosis or edema skin warm and dry, Skin; very tanned, Neuropsych; mood and affect appropriate       Assessment & Plan:   #66 76 year old white female with history of adenomatous colon polyps, last colonoscopy April 2013, no polyps. Recommended for 5 year interval follow-up. Patiently currently asymptomatic. #2 diverticulosis #3 history of atrial fibrillation #4 chronic anticoagulation-on Xarelto #5 remote history of provoked PE  Plan Patient will be scheduled for colonoscopy with Dr. Fuller Plan. Procedure discussed in detail with patient including risks and benefits and she is agreeable to proceed. She will need to hold Xarelto for 24 hours prior to colonoscopy, we will communicate with her cardiologist Dr.Benshimon   To assure that this is reasonable for this patient.  Kanda Deluna Genia Harold PA-C 04/05/2017   Cc: Janith Lima, MD

## 2017-04-08 NOTE — Telephone Encounter (Signed)
That would be fine!  Thanks! 

## 2017-04-09 ENCOUNTER — Ambulatory Visit
Admission: RE | Admit: 2017-04-09 | Discharge: 2017-04-09 | Disposition: A | Discharge status: Home / Self Care | Payer: Medicare Other | Source: Ambulatory Visit | Attending: Internal Medicine | Admitting: Internal Medicine

## 2017-04-09 DIAGNOSIS — Z1231 Encounter for screening mammogram for malignant neoplasm of breast: Secondary | ICD-10-CM | POA: Diagnosis not present

## 2017-04-09 NOTE — Telephone Encounter (Signed)
Patient informed to stop Xarelto 1 day before procedure. She verbalized understanding.

## 2017-04-10 LAB — HM MAMMOGRAPHY

## 2017-04-12 ENCOUNTER — Telehealth: Payer: Self-pay | Admitting: Gastroenterology

## 2017-04-12 NOTE — Telephone Encounter (Signed)
Informed patient that I have a free sample of Suprep that I leave at the front desk if she can pick it up. Pt states she will come by today and pick it up.

## 2017-04-12 NOTE — Telephone Encounter (Signed)
Left a message for patient to return my call. 

## 2017-04-16 ENCOUNTER — Ambulatory Visit (AMBULATORY_SURGERY_CENTER): Payer: Medicare Other | Admitting: Gastroenterology

## 2017-04-16 ENCOUNTER — Encounter: Payer: Self-pay | Admitting: Gastroenterology

## 2017-04-16 VITALS — BP 121/61 | HR 59 | Temp 98.0°F | Resp 16 | Ht 66.0 in | Wt 178.0 lb

## 2017-04-16 DIAGNOSIS — Z86010 Personal history of colonic polyps: Secondary | ICD-10-CM | POA: Diagnosis present

## 2017-04-16 DIAGNOSIS — Z1211 Encounter for screening for malignant neoplasm of colon: Secondary | ICD-10-CM | POA: Diagnosis not present

## 2017-04-16 HISTORY — PX: COLONOSCOPY: SHX174

## 2017-04-16 LAB — HM COLONOSCOPY

## 2017-04-16 MED ORDER — SODIUM CHLORIDE 0.9 % IV SOLN: 500.0000 mL | INTRAVENOUS | Status: DC

## 2017-04-16 NOTE — Op Note (Signed)
[facility] Patient Name: [name] Procedure Date: 04/16/2017 8:34 AM MRN: [id number] Endoscopist: Ladene Artist , MD Age: 76 Referring MD:  Date of Birth: 06/25/1941 Gender: Female Account #: [id number] Procedure:                Colonoscopy Indications:              Surveillance: Personal history of adenomatous                            polyps on last colonoscopy 5 years ago Medicines:                Monitored Anesthesia Care Procedure:                Pre-Anesthesia Assessment:                           - Prior to the procedure, a History and Physical                            was performed, and patient medications and                            allergies were reviewed. The patient's tolerance of                            previous anesthesia was also reviewed. The risks                            and benefits of the procedure and the sedation                            options and risks were discussed with the patient.                            All questions were answered, and informed consent                            was obtained. Prior Anticoagulants: The patient has                            taken Xarelto (rivaroxaban), last dose was 2 days                            prior to procedure. ASA Grade Assessment: III - A                            patient with severe systemic disease. After                            reviewing the risks and benefits, the patient was                            deemed in satisfactory condition to undergo the  procedure.                           After obtaining informed consent, the colonoscope                            was passed under direct vision. Throughout the                            procedure, the patient's blood pressure, pulse, and                            oxygen saturations were monitored continuously. The                            Colonoscope was introduced through the anus and                     advanced to the the cecum, identified by                            appendiceal orifice and ileocecal valve. The                            ileocecal valve, appendiceal orifice, and rectum                            were photographed. The quality of the bowel                            preparation was good. The colonoscopy was performed                            without difficulty. The patient tolerated the                            procedure well. Scope In: 8:46:32 AM Scope Out: 8:59:27 AM Scope Withdrawal Time: 0 hours 8 minutes 45 seconds  Total Procedure Duration: 0 hours 12 minutes 55 seconds  Findings:                 The perianal and digital rectal examinations were                            normal.                           Multiple medium-mouthed diverticula were found in                            the left colon. There was no evidence of                            diverticular bleeding.                           A few small-mouthed diverticula were found in  the                            transverse colon. There was no evidence of                            diverticular bleeding.                           The exam was otherwise without abnormality on                            direct and retroflexion views. Complications:            No immediate complications. Estimated blood loss:                            None. Estimated Blood Loss:     Estimated blood loss: none. Impression:               - Moderate diverticulosis in the left colon.                           - Mild diverticulosis in the transverse colon.                           - The examination was otherwise normal on direct                            and retroflexion views.                           - No specimens collected. Recommendation:           - Resume Xarelto (rivaroxaban) tomorrow at prior                            dose. Refer to managing physician for further                             adjustment of therapy.                           - Patient has a contact number available for                            emergencies. The signs and symptoms of potential                            delayed complications were discussed with the                            patient. Return to normal activities tomorrow.                            Written discharge instructions were provided to the  patient.                           - High fiber diet.                           - Continue present medications.                           - No repeat colonoscopy due to age and the absence                            of colonic polyps. Ladene Artist, MD 04/16/2017 9:03:05 AM This report has been signed electronically.

## 2017-04-16 NOTE — Patient Instructions (Signed)
YOU HAD AN ENDOSCOPIC PROCEDURE TODAY AT [facility]:   Refer to the procedure report that was given to you for any specific questions about what was found during the examination.  If the procedure report does not answer your questions, please call your gastroenterologist to clarify.  If you requested that your care partner not be given the details of your procedure findings, then the procedure report has been included in a sealed envelope for you to review at your convenience later.  YOU SHOULD EXPECT: Some feelings of bloating in the abdomen. Passage of more gas than usual.  Walking can help get rid of the air that was put into your GI tract during the procedure and reduce the bloating. If you had a lower endoscopy (such as a colonoscopy or flexible sigmoidoscopy) you may notice spotting of blood in your stool or on the toilet paper. If you underwent a bowel prep for your procedure, you may not have a normal bowel movement for a few days.  Please Note:  You might notice some irritation and congestion in your nose or some drainage.  This is from the oxygen used during your procedure.  There is no need for concern and it should clear up in a day or so.  SYMPTOMS TO REPORT IMMEDIATELY:   Following lower endoscopy (colonoscopy or flexible sigmoidoscopy):  Excessive amounts of blood in the stool  Significant tenderness or worsening of abdominal pains  Swelling of the abdomen that is new, acute  Fever of 100F or higher  For urgent or emergent issues, a gastroenterologist can be reached at any hour by calling [phone].   DIET:  We do recommend a small meal at first, but then you may proceed to your regular diet.  Drink plenty of fluids but you should avoid alcoholic beverages for 24 hours. Follow a high fiber diet. Please see handout given to you by your recovery nurse on high fiber diet.  MEDICATIONS:  Resume Xarelto (rivaroxaban) tomorrow at prior dose. Refer to managing  physician for further adjustment of therapy.  Please see handouts given to you by your recovery nurse.  ACTIVITY:  You should plan to take it easy for the rest of today and you should NOT DRIVE or use heavy machinery until tomorrow (because of the sedation medicines used during the test).    FOLLOW UP: Our staff will call the number listed on your records the next business day following your procedure to check on you and address any questions or concerns that you may have regarding the information given to you following your procedure. If we do not reach you, we will leave a message.  However, if you are feeling well and you are not experiencing any problems, there is no need to return our call.  We will assume that you have returned to your regular daily activities without incident.  If any biopsies were taken you will be contacted by phone or by letter within the next 1-3 weeks.  Please call us at [phone] if you have not heard about the biopsies in 3 weeks.   Thank you for allowing [place] to provide for your healthcare needs today.   SIGNATURES/CONFIDENTIALITY: You and/or your care partner have signed paperwork which will be entered into your electronic medical record.  These signatures attest to the fact that that the information above on your After Visit Summary has been reviewed and is understood.  Full responsibility of the confidentiality of this discharge information lies  with you and/or your care-partner.

## 2017-04-16 NOTE — Progress Notes (Signed)
A/ox3, pleased with MAC, report to RN 

## 2017-04-17 ENCOUNTER — Telehealth: Payer: Self-pay | Admitting: *Deleted

## 2017-04-17 NOTE — Telephone Encounter (Signed)
  Follow up Call-  Call back number 04/16/2017  Post procedure Call Back phone  # [phone]  Permission to leave phone message Yes  Some recent data might be hidden     Patient questions:  Do you have a fever, pain , or abdominal swelling? No. Pain Score  0 *  Have you tolerated food without any problems? Yes.    Have you been able to return to your normal activities? Yes.    Do you have any questions about your discharge instructions: Diet   No. Medications  No. Follow up visit  No.  Do you have questions or concerns about your Care? No.  Actions: * If pain score is 4 or above: No action needed, pain <4.

## 2017-06-20 ENCOUNTER — Ambulatory Visit (HOSPITAL_COMMUNITY)
Admission: RE | Admit: 2017-06-20 | Discharge: 2017-06-20 | Disposition: A | Discharge status: Home / Self Care | Payer: Medicare Other | Source: Ambulatory Visit | Attending: Internal Medicine | Admitting: Internal Medicine

## 2017-06-20 ENCOUNTER — Encounter (HOSPITAL_COMMUNITY): Payer: Self-pay | Admitting: Internal Medicine

## 2017-06-20 VITALS — BP 130/72 | HR 65 | Wt 183.5 lb

## 2017-06-20 DIAGNOSIS — I5022 Chronic systolic (congestive) heart failure: Secondary | ICD-10-CM | POA: Insufficient documentation

## 2017-06-20 DIAGNOSIS — Z7901 Long term (current) use of anticoagulants: Secondary | ICD-10-CM | POA: Insufficient documentation

## 2017-06-20 DIAGNOSIS — Z79899 Other long term (current) drug therapy: Secondary | ICD-10-CM | POA: Diagnosis not present

## 2017-06-20 DIAGNOSIS — E785 Hyperlipidemia, unspecified: Secondary | ICD-10-CM | POA: Diagnosis not present

## 2017-06-20 DIAGNOSIS — Z86711 Personal history of pulmonary embolism: Secondary | ICD-10-CM | POA: Diagnosis not present

## 2017-06-20 DIAGNOSIS — Z86718 Personal history of other venous thrombosis and embolism: Secondary | ICD-10-CM | POA: Diagnosis not present

## 2017-06-20 DIAGNOSIS — I4519 Other right bundle-branch block: Secondary | ICD-10-CM | POA: Insufficient documentation

## 2017-06-20 DIAGNOSIS — I480 Paroxysmal atrial fibrillation: Secondary | ICD-10-CM

## 2017-06-20 MED ORDER — METOPROLOL TARTRATE 25 MG PO TABS: ORAL_TABLET | ORAL | 3 refills | Status: DC

## 2017-06-20 MED ORDER — FLECAINIDE ACETATE 100 MG PO TABS: ORAL_TABLET | ORAL | 3 refills | Status: DC

## 2017-06-20 NOTE — Progress Notes (Signed)
Patient ID: [name], female   DOB: 25-Oct-1941, 75 y.o.   MRN: [id number]    Advanced Heart Failure Clinic Note   HPI:  [name] is a 76 y/o woman with h/o of PAF and AFL s/p AFL ablation.   She was admitted in 11/13 with acute bilateral PE, rapid AF and heart failure after a long plane trip. .EF 40-45%.  On 10/28/12 Unsuccessful DCCV . Loaded amio and underwent repeat DC-CV on 11/13. Maintained on Xarelto. In 11/13 had AFL.  Amio increased to 400 bid. On 12/10/12 successful DC-CV.  Amiodarone was eventually stopped.   04/22/13 ECHO EF 65%, mild LVH, normal RV size and systolic function, PA systolic pressure 35 mmHg .Had recurrent AF in 9/15. Amiodarone was restarted and she was cardioverted to NSR on 09/14/14. Seen again on Oct 16. Asymptomatic. Was back in AFL with RVR at 150. Underwent DC-CV but reverted to AFL. S/p A flutter ablation 12/15/2014. Was seen by Dr Rayann Heman in follow up last 01/08/15 and was stable at that time with no symptoms. She was in NSR at that time. Metoprolol stopped.   Admitted 10/31/16-11/02/16 with SOB, AFib. Started on flecainide.   Returns today for HF follow up. Overall feeling well. No SOB with walking, she does 10,000 steps a day. Currently doing [employer] to lose weight. Denies palpitations and chest pain. Taking all medications. Denies melena and hematochezia.    ROS: All systems negative except as listed in HPI, PMH and Problem List.  Past Medical History:  Diagnosis Date  . Atrial flutter ([place])    a. s/p CTI ablation 11/2014  . Diverticulosis 2002   Dr. Fuller Plan  . DVT (deep venous thrombosis) ([place]) 2013   "S/P a long airplane ride"  . Fasting hyperglycemia    A1C  within normal limits  . Heart murmur    was told she has one but has never affected her  . Hyperlipidemia   . Pancreatitis due to common bile duct stone   . Paroxysmal atrial fibrillation ([facility])   . Personal history of colonic polyps 2002   tubular adenoma  . Pulmonary embolism  ([place]) 2013   "S/P a long airplane ride"  . Tortuous colon   . Vitamin D deficiency     Current Outpatient Prescriptions  Medication Sig Dispense Refill  . flecainide (TAMBOCOR) 100 MG tablet TAKE ONE TABLET EVERY TWELVE HOURS. 180 tablet 0  . levothyroxine (SYNTHROID, LEVOTHROID) 50 MCG tablet TAKE 1/2 TABLET DAILY, EXCEPT 1 TABLET ON TUESDAY, THURSDAY AND SATURDAY. 64 tablet 3  . metoprolol tartrate (LOPRESSOR) 25 MG tablet TAKE (1/2) TABLET TWICE DAILY. 90 tablet 0  . XARELTO 20 MG TABS tablet TAKE 1 TABLET ONCE DAILY. 90 tablet 3   No current facility-administered medications for this encounter.    Vitals:   06/20/17 1444  BP: 130/72  Pulse: 65  SpO2: 100%  Weight: 183 lb 8 oz (83.2 kg)     PHYSICAL EXAM: General: female, NAD. Walked into clinic without difficulty.  HEENT: normal Neck: supple. No JVD Carotids 2+ bilaterally; no bruits. No thyromegaly or nodule noted.  Cor: PMI normal. Regular rate and rhythm.  Lungs: CTAB, normal effort.  Abdomen: Soft, non tender, non distended.  no HSM. No bruits or masses. +BS  Extremities: no cyanosis, clubbing, rash. No peripheral edema.  Neuro: alert & orientedx3, cranial nerves grossly intact. Moves all 4 extremities w/o difficulty. Affect pleasant.    ASSESSMENT & PLAN:  1) Paroxysmal Afib RVR:  -  Has had history of recurrent AFL despite DC-CV (x 2 in 2013 and x 1 in 2015), now, as above s/p AFL ablation by Dr Rayann Heman. Long history of AF/AFL by EKGs -  Afib s/p DCCV in 09/22/16. - Recurrent AF in 11/17 now NSR on flecainide - Remains in NSR.  - Continue metoprolol.  - This patients CHA2DS2-VASc Score and unadjusted Ischemic Stroke Rate (% per year) is equal to 7.2 % stroke rate/year from a score of 5 Above score calculated as 1 point each if present [CHF, HTN, DM, Vascular=MI/PAD/Aortic Plaque, Age if 65-74, or Female], 2 points each if present [Age > 75, or Stroke/TIA/TE] - Continue Xarelto for anticogulation.  2) Hx of DVT and  PEs - Continue Xarelto 20mg  daily.  3.) History of chronic systolic CHF: in the setting of acute PE.  - EF now recovered.   Follow up in one year.   Monica Leas NP-C 3:06 PM 06/20/2017   Patient seen and examined with Monica Booze, NP. We discussed all aspects of the encounter. I agree with the assessment and plan as stated above.   She is doing great. No recurrent AF on flecainide. No bleeding with Xarelto,  Will continue current regimen.   Monica Bickers, MD  10:53 PM

## 2017-06-20 NOTE — Patient Instructions (Signed)
We will contact you in 1 year to schedule your next appointment.  

## 2017-07-16 ENCOUNTER — Encounter: Payer: Self-pay | Admitting: Internal Medicine

## 2017-07-16 ENCOUNTER — Other Ambulatory Visit (INDEPENDENT_AMBULATORY_CARE_PROVIDER_SITE_OTHER): Payer: Medicare Other

## 2017-07-16 ENCOUNTER — Ambulatory Visit (INDEPENDENT_AMBULATORY_CARE_PROVIDER_SITE_OTHER): Payer: Medicare Other | Admitting: Internal Medicine

## 2017-07-16 VITALS — BP 146/82 | HR 63 | Temp 98.1°F | Resp 12 | Ht 66.0 in | Wt 191.0 lb

## 2017-07-16 DIAGNOSIS — Z0000 Encounter for general adult medical examination without abnormal findings: Secondary | ICD-10-CM | POA: Diagnosis not present

## 2017-07-16 DIAGNOSIS — E7800 Pure hypercholesterolemia, unspecified: Secondary | ICD-10-CM

## 2017-07-16 DIAGNOSIS — I4892 Unspecified atrial flutter: Secondary | ICD-10-CM | POA: Diagnosis not present

## 2017-07-16 DIAGNOSIS — E039 Hypothyroidism, unspecified: Secondary | ICD-10-CM

## 2017-07-16 DIAGNOSIS — E669 Obesity, unspecified: Secondary | ICD-10-CM

## 2017-07-16 DIAGNOSIS — E038 Other specified hypothyroidism: Secondary | ICD-10-CM

## 2017-07-16 LAB — COMPREHENSIVE METABOLIC PANEL
ALK PHOS: 54 U/L (ref 39–117)
ALT: 17 U/L (ref 0–35)
AST: 18 U/L (ref 0–37)
Albumin: 4.1 g/dL (ref 3.5–5.2)
BILIRUBIN TOTAL: 0.4 mg/dL (ref 0.2–1.2)
BUN: 26 mg/dL — ABNORMAL HIGH (ref 6–23)
CALCIUM: 9.8 mg/dL (ref 8.4–10.5)
CO2: 30 mEq/L (ref 19–32)
CREATININE: 0.86 mg/dL (ref 0.40–1.20)
Chloride: 104 mEq/L (ref 96–112)
GFR: 67.99 mL/min (ref >60.00)
GLUCOSE: 76 mg/dL (ref 70–99)
Potassium: 4.4 mEq/L (ref 3.5–5.1)
Sodium: 139 mEq/L (ref 135–145)
TOTAL PROTEIN: 6.7 g/dL (ref 6.0–8.3)

## 2017-07-16 LAB — CBC WITH DIFFERENTIAL/PLATELET
BASOS ABS: 0 10*3/uL (ref 0.0–0.1)
Basophils Relative: 0.4 % (ref 0.0–3.0)
EOS ABS: 0.2 10*3/uL (ref 0.0–0.7)
Eosinophils Relative: 3 % (ref 0.0–5.0)
HCT: 38.3 % (ref 36.0–46.0)
Hemoglobin: 12.5 g/dL (ref 12.0–15.0)
LYMPHS ABS: 1 10*3/uL (ref 0.7–4.0)
LYMPHS PCT: 18.5 % (ref 12.0–46.0)
MCHC: 32.5 g/dL (ref 30.0–36.0)
MCV: 95.5 fl (ref 78.0–100.0)
MONOS PCT: 6.9 % (ref 3.0–12.0)
Monocytes Absolute: 0.4 10*3/uL (ref 0.1–1.0)
NEUTROS PCT: 71.2 % (ref 43.0–77.0)
Neutro Abs: 3.8 10*3/uL (ref 1.4–7.7)
PLATELETS: 278 10*3/uL (ref 150.0–400.0)
RBC: 4.02 Mil/uL (ref 3.87–5.11)
RDW: 13.8 % (ref 11.5–15.5)
WBC: 5.3 10*3/uL (ref 4.0–10.5)

## 2017-07-16 LAB — LIPID PANEL
Cholesterol: 204 mg/dL — ABNORMAL HIGH (ref 0–200)
HDL: 71.8 mg/dL (ref >39.00)
LDL Cholesterol: 122 mg/dL — ABNORMAL HIGH (ref 0–99)
NONHDL: 131.94
TRIGLYCERIDES: 51 mg/dL (ref 0.0–149.0)
Total CHOL/HDL Ratio: 3
VLDL: 10.2 mg/dL (ref 0.0–40.0)

## 2017-07-16 LAB — TSH: TSH: 3.57 u[IU]/mL (ref 0.35–4.50)

## 2017-07-16 MED ORDER — LEVOTHYROXINE SODIUM 50 MCG PO TABS
ORAL_TABLET | ORAL | 3 refills | Status: DC
Start: 2017-07-16 — End: 2018-06-11
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
## 2017-07-16 NOTE — Patient Instructions (Signed)

## 2017-07-17 NOTE — Assessment & Plan Note (Signed)

## 2017-08-24 DIAGNOSIS — H43811 Vitreous degeneration, right eye: Secondary | ICD-10-CM | POA: Diagnosis not present

## 2017-08-24 DIAGNOSIS — H5213 Myopia, bilateral: Secondary | ICD-10-CM | POA: Diagnosis not present

## 2017-08-24 DIAGNOSIS — H524 Presbyopia: Secondary | ICD-10-CM | POA: Diagnosis not present

## 2017-08-24 DIAGNOSIS — H25813 Combined forms of age-related cataract, bilateral: Secondary | ICD-10-CM | POA: Diagnosis not present

## 2017-09-20 DIAGNOSIS — L812 Freckles: Secondary | ICD-10-CM | POA: Diagnosis not present

## 2017-09-20 DIAGNOSIS — L821 Other seborrheic keratosis: Secondary | ICD-10-CM | POA: Diagnosis not present

## 2017-09-20 DIAGNOSIS — M7130 Other bursal cyst, unspecified site: Secondary | ICD-10-CM | POA: Diagnosis not present

## 2018-03-18 ENCOUNTER — Other Ambulatory Visit (HOSPITAL_COMMUNITY): Payer: Self-pay | Admitting: Internal Medicine

## 2018-03-20 ENCOUNTER — Other Ambulatory Visit: Payer: Self-pay | Admitting: Internal Medicine

## 2018-03-20 DIAGNOSIS — Z1231 Encounter for screening mammogram for malignant neoplasm of breast: Secondary | ICD-10-CM

## 2018-04-11 ENCOUNTER — Ambulatory Visit
Admission: RE | Admit: 2018-04-11 | Discharge: 2018-04-11 | Disposition: A | Discharge status: Home / Self Care | Payer: Medicare Other | Source: Ambulatory Visit | Attending: Internal Medicine | Admitting: Internal Medicine

## 2018-04-11 DIAGNOSIS — Z1231 Encounter for screening mammogram for malignant neoplasm of breast: Secondary | ICD-10-CM

## 2018-04-12 LAB — HM MAMMOGRAPHY

## 2018-05-27 DIAGNOSIS — H25813 Combined forms of age-related cataract, bilateral: Secondary | ICD-10-CM | POA: Diagnosis not present

## 2018-05-27 DIAGNOSIS — H43813 Vitreous degeneration, bilateral: Secondary | ICD-10-CM | POA: Diagnosis not present

## 2018-05-27 DIAGNOSIS — H04123 Dry eye syndrome of bilateral lacrimal glands: Secondary | ICD-10-CM | POA: Diagnosis not present

## 2018-05-27 DIAGNOSIS — H5213 Myopia, bilateral: Secondary | ICD-10-CM | POA: Diagnosis not present

## 2018-06-11 ENCOUNTER — Other Ambulatory Visit (HOSPITAL_COMMUNITY): Payer: Self-pay | Admitting: Internal Medicine

## 2018-06-11 ENCOUNTER — Other Ambulatory Visit: Payer: Self-pay | Admitting: Internal Medicine

## 2018-06-11 DIAGNOSIS — E039 Hypothyroidism, unspecified: Secondary | ICD-10-CM

## 2018-07-03 ENCOUNTER — Other Ambulatory Visit: Payer: Self-pay | Admitting: Ophthalmology

## 2018-07-03 DIAGNOSIS — D23111 Other benign neoplasm of skin of right upper eyelid, including canthus: Secondary | ICD-10-CM | POA: Diagnosis not present

## 2018-07-03 DIAGNOSIS — L919 Hypertrophic disorder of the skin, unspecified: Secondary | ICD-10-CM | POA: Diagnosis not present
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
## 2018-07-18 ENCOUNTER — Encounter: Payer: Self-pay | Admitting: Internal Medicine

## 2018-07-18 ENCOUNTER — Ambulatory Visit (INDEPENDENT_AMBULATORY_CARE_PROVIDER_SITE_OTHER): Payer: Medicare Other | Admitting: Internal Medicine

## 2018-07-18 ENCOUNTER — Ambulatory Visit (INDEPENDENT_AMBULATORY_CARE_PROVIDER_SITE_OTHER): Payer: Medicare Other | Admitting: *Deleted

## 2018-07-18 ENCOUNTER — Other Ambulatory Visit (INDEPENDENT_AMBULATORY_CARE_PROVIDER_SITE_OTHER): Payer: Medicare Other

## 2018-07-18 VITALS — BP 128/68 | HR 63 | Resp 17 | Ht 66.0 in | Wt 207.0 lb

## 2018-07-18 VITALS — BP 128/68 | HR 63 | Temp 97.7°F | Ht 66.0 in | Wt 207.0 lb

## 2018-07-18 DIAGNOSIS — I4892 Unspecified atrial flutter: Secondary | ICD-10-CM | POA: Diagnosis not present

## 2018-07-18 DIAGNOSIS — Z0000 Encounter for general adult medical examination without abnormal findings: Secondary | ICD-10-CM

## 2018-07-18 DIAGNOSIS — E559 Vitamin D deficiency, unspecified: Secondary | ICD-10-CM

## 2018-07-18 DIAGNOSIS — E039 Hypothyroidism, unspecified: Secondary | ICD-10-CM

## 2018-07-18 DIAGNOSIS — E7800 Pure hypercholesterolemia, unspecified: Secondary | ICD-10-CM

## 2018-07-18 LAB — LIPID PANEL
Cholesterol: 222 mg/dL — ABNORMAL HIGH (ref 0–200)
HDL: 65.7 mg/dL (ref >39.00)
LDL Cholesterol: 143 mg/dL — ABNORMAL HIGH (ref 0–99)
NONHDL: 155.95
Total CHOL/HDL Ratio: 3
Triglycerides: 64 mg/dL (ref 0.0–149.0)
VLDL: 12.8 mg/dL (ref 0.0–40.0)

## 2018-07-18 LAB — COMPREHENSIVE METABOLIC PANEL
ALK PHOS: 65 U/L (ref 39–117)
ALT: 10 U/L (ref 0–35)
AST: 16 U/L (ref 0–37)
Albumin: 4.2 g/dL (ref 3.5–5.2)
BUN: 23 mg/dL (ref 6–23)
CO2: 29 mEq/L (ref 19–32)
CREATININE: 1.04 mg/dL (ref 0.40–1.20)
Calcium: 9.7 mg/dL (ref 8.4–10.5)
Chloride: 103 mEq/L (ref 96–112)
GFR: 54.46 mL/min — AB (ref >60.00)
GLUCOSE: 94 mg/dL (ref 70–99)
Potassium: 4.6 mEq/L (ref 3.5–5.1)
Sodium: 141 mEq/L (ref 135–145)
TOTAL PROTEIN: 7.5 g/dL (ref 6.0–8.3)
Total Bilirubin: 0.6 mg/dL (ref 0.2–1.2)

## 2018-07-18 LAB — VITAMIN D 25 HYDROXY (VIT D DEFICIENCY, FRACTURES): VITD: 36.15 ng/mL (ref 30.00–100.00)

## 2018-07-18 LAB — CBC WITH DIFFERENTIAL/PLATELET
BASOS ABS: 0.1 10*3/uL (ref 0.0–0.1)
Basophils Relative: 0.8 % (ref 0.0–3.0)
EOS PCT: 1.2 % (ref 0.0–5.0)
Eosinophils Absolute: 0.1 10*3/uL (ref 0.0–0.7)
HCT: 36.7 % (ref 36.0–46.0)
HEMOGLOBIN: 12.1 g/dL (ref 12.0–15.0)
LYMPHS PCT: 11.8 % — AB (ref 12.0–46.0)
Lymphs Abs: 0.9 10*3/uL (ref 0.7–4.0)
MCHC: 33 g/dL (ref 30.0–36.0)
MCV: 90.6 fl (ref 78.0–100.0)
Monocytes Absolute: 0.5 10*3/uL (ref 0.1–1.0)
Monocytes Relative: 6.3 % (ref 3.0–12.0)
Neutro Abs: 6.2 10*3/uL (ref 1.4–7.7)
Neutrophils Relative %: 79.9 % — ABNORMAL HIGH (ref 43.0–77.0)
Platelets: 304 10*3/uL (ref 150.0–400.0)
RBC: 4.05 Mil/uL (ref 3.87–5.11)
RDW: 13.7 % (ref 11.5–15.5)
WBC: 7.7 10*3/uL (ref 4.0–10.5)

## 2018-07-18 LAB — TSH: TSH: 3.64 u[IU]/mL (ref 0.35–4.50)

## 2018-07-18 MED ORDER — LEVOTHYROXINE SODIUM 50 MCG PO TABS: 25.0000 ug | ORAL_TABLET | Freq: Every day | ORAL | 1 refills | Status: DC

## 2018-07-18 MED ORDER — ZOSTER VAC RECOMB ADJUVANTED 50 MCG/0.5ML IM SUSR: 0.5000 mL | Freq: Once | INTRAMUSCULAR | 1 refills | Status: AC

## 2018-07-18 MED ORDER — ROSUVASTATIN CALCIUM 10 MG PO TABS: 10.0000 mg | ORAL_TABLET | Freq: Every day | ORAL | 1 refills | Status: DC

## 2018-07-18 NOTE — Patient Instructions (Addendum)
Continue doing brain stimulating activities (puzzles, reading, adult coloring books, staying active) to keep memory sharp.   Continue to eat heart healthy diet (full of fruits, vegetables, whole grains, lean protein, water--limit salt, fat, and sugar intake) and increase physical activity as tolerated.  [name] , Thank you for taking time to come for your Medicare Wellness Visit. I appreciate your ongoing commitment to your health goals. Please review the following plan we discussed and let me know if I can assist you in the future.   These are the goals we discussed: Goals    . Patient Stated     I want to lose weight and keep it off. I am a life time member of weight watchers and I have started back and plan to continue to go long-term. I will continue to exercise and be active. Enjoy life, family, friends, and travel to [place] in the winter.        This is a list of the screening recommended for you and due dates:  Health Maintenance  Topic Date Due  . Flu Shot  07/25/2018  . Tetanus Vaccine  03/03/2019  . Pneumonia vaccines  Completed   Health Maintenance, Female Adopting a healthy lifestyle and getting preventive care can go a long way to promote health and wellness. Talk with your health care provider about what schedule of regular examinations is right for you. This is a good chance for you to check in with your provider about disease prevention and staying healthy. In between checkups, there are plenty of things you can do on your own. Experts have done a lot of research about which lifestyle changes and preventive measures are most likely to keep you healthy. Ask your health care provider for more information. Weight and diet Eat a healthy diet  Be sure to include plenty of vegetables, fruits, low-fat dairy products, and lean protein.  Do not eat a lot of foods high in solid fats, added sugars, or salt.  Get regular exercise. This is one of the most important things you  can do for your health. ? Most adults should exercise for at least 150 minutes each week. The exercise should increase your heart rate and make you sweat (moderate-intensity exercise). ? Most adults should also do strengthening exercises at least twice a week. This is in addition to the moderate-intensity exercise.  Maintain a healthy weight  Body mass index (BMI) is a measurement that can be used to identify possible weight problems. It estimates body fat based on height and weight. Your health care provider can help determine your BMI and help you achieve or maintain a healthy weight.  For females 85 years of age and older: ? A BMI below 18.5 is considered underweight. ? A BMI of 18.5 to 24.9 is normal. ? A BMI of 25 to 29.9 is considered overweight. ? A BMI of 30 and above is considered obese.  Watch levels of cholesterol and blood lipids  You should start having your blood tested for lipids and cholesterol at 78 years of age, then have this test every 5 years.  You may need to have your cholesterol levels checked more often if: ? Your lipid or cholesterol levels are high. ? You are older than 78 years of age. ? You are at high risk for heart disease.  Cancer screening Lung Cancer  Lung cancer screening is recommended for adults 71-73 years old who are at high risk for lung cancer because of a history of  smoking.  A yearly low-dose CT scan of the lungs is recommended for people who: ? Currently smoke. ? Have quit within the past 15 years. ? Have at least a 30-pack-year history of smoking. A pack year is smoking an average of one pack of cigarettes a day for 1 year.  Yearly screening should continue until it has been 15 years since you quit.  Yearly screening should stop if you develop a health problem that would prevent you from having lung cancer treatment.  Breast Cancer  Practice breast self-awareness. This means understanding how your breasts normally appear and  feel.  It also means doing regular breast self-exams. Let your health care provider know about any changes, no matter how small.  If you are in your 20s or 30s, you should have a clinical breast exam (CBE) by a health care provider every 1-3 years as part of a regular health exam.  If you are 88 or older, have a CBE every year. Also consider having a breast X-ray (mammogram) every year.  If you have a family history of breast cancer, talk to your health care provider about genetic screening.  If you are at high risk for breast cancer, talk to your health care provider about having an MRI and a mammogram every year.  Breast cancer gene (BRCA) assessment is recommended for women who have family members with BRCA-related cancers. BRCA-related cancers include: ? Breast. ? Ovarian. ? Tubal. ? Peritoneal cancers.  Results of the assessment will determine the need for genetic counseling and BRCA1 and BRCA2 testing.  Cervical Cancer Your health care provider may recommend that you be screened regularly for cancer of the pelvic organs (ovaries, uterus, and vagina). This screening involves a pelvic examination, including checking for microscopic changes to the surface of your cervix (Pap test). You may be encouraged to have this screening done every 3 years, beginning at age 16.  For women ages 77-65, health care providers may recommend pelvic exams and Pap testing every 3 years, or they may recommend the Pap and pelvic exam, combined with testing for human papilloma virus (HPV), every 5 years. Some types of HPV increase your risk of cervical cancer. Testing for HPV may also be done on women of any age with unclear Pap test results.  Other health care providers may not recommend any screening for nonpregnant women who are considered low risk for pelvic cancer and who do not have symptoms. Ask your health care provider if a screening pelvic exam is right for you.  If you have had past treatment for  cervical cancer or a condition that could lead to cancer, you need Pap tests and screening for cancer for at least 20 years after your treatment. If Pap tests have been discontinued, your risk factors (such as having a new sexual partner) need to be reassessed to determine if screening should resume. Some women have medical problems that increase the chance of getting cervical cancer. In these cases, your health care provider may recommend more frequent screening and Pap tests.  Colorectal Cancer  This type of cancer can be detected and often prevented.  Routine colorectal cancer screening usually begins at 78 years of age and continues through 79 years of age.  Your health care provider may recommend screening at an earlier age if you have risk factors for colon cancer.  Your health care provider may also recommend using home test kits to check for hidden blood in the stool.  A small camera at  the end of a tube can be used to examine your colon directly (sigmoidoscopy or colonoscopy). This is done to check for the earliest forms of colorectal cancer.  Routine screening usually begins at age 27.  Direct examination of the colon should be repeated every 5-10 years through 78 years of age. However, you may need to be screened more often if early forms of precancerous polyps or small growths are found.  Skin Cancer  Check your skin from head to toe regularly.  Tell your health care provider about any new moles or changes in moles, especially if there is a change in a mole's shape or color.  Also tell your health care provider if you have a mole that is larger than the size of a pencil eraser.  Always use sunscreen. Apply sunscreen liberally and repeatedly throughout the day.  Protect yourself by wearing long sleeves, pants, a wide-brimmed hat, and sunglasses whenever you are outside.  Heart disease, diabetes, and high blood pressure  High blood pressure causes heart disease and increases  the risk of stroke. High blood pressure is more likely to develop in: ? People who have blood pressure in the high end of the normal range (130-139/85-89 mm Hg). ? People who are overweight or obese. ? People who are African American.  If you are 69-59 years of age, have your blood pressure checked every 3-5 years. If you are 30 years of age or older, have your blood pressure checked every year. You should have your blood pressure measured twice-once when you are at a hospital or clinic, and once when you are not at a hospital or clinic. Record the average of the two measurements. To check your blood pressure when you are not at a hospital or clinic, you can use: ? An automated blood pressure machine at a pharmacy. ? A home blood pressure monitor.  If you are between 66 years and 44 years old, ask your health care provider if you should take aspirin to prevent strokes.  Have regular diabetes screenings. This involves taking a blood sample to check your fasting blood sugar level. ? If you are at a normal weight and have a low risk for diabetes, have this test once every three years after 78 years of age. ? If you are overweight and have a high risk for diabetes, consider being tested at a younger age or more often. Preventing infection Hepatitis B  If you have a higher risk for hepatitis B, you should be screened for this virus. You are considered at high risk for hepatitis B if: ? You were born in a country where hepatitis B is common. Ask your health care provider which countries are considered high risk. ? Your parents were born in a high-risk country, and you have not been immunized against hepatitis B (hepatitis B vaccine). ? You have HIV or AIDS. ? You use needles to inject street drugs. ? You live with someone who has hepatitis B. ? You have had sex with someone who has hepatitis B. ? You get hemodialysis treatment. ? You take certain medicines for conditions, including cancer, organ  transplantation, and autoimmune conditions.  Hepatitis C  Blood testing is recommended for: ? Everyone born from [birth year] through 1966. ? Anyone with known risk factors for hepatitis C.  Sexually transmitted infections (STIs)  You should be screened for sexually transmitted infections (STIs) including gonorrhea and chlamydia if: ? You are sexually active and are younger than 78 years of age. ?  You are older than 78 years of age and your health care provider tells you that you are at risk for this type of infection. ? Your sexual activity has changed since you were last screened and you are at an increased risk for chlamydia or gonorrhea. Ask your health care provider if you are at risk.  If you do not have HIV, but are at risk, it may be recommended that you take a prescription medicine daily to prevent HIV infection. This is called pre-exposure prophylaxis (PrEP). You are considered at risk if: ? You are sexually active and do not regularly use condoms or know the HIV status of your partner(s). ? You take drugs by injection. ? You are sexually active with a partner who has HIV.  Talk with your health care provider about whether you are at high risk of being infected with HIV. If you choose to begin PrEP, you should first be tested for HIV. You should then be tested every 3 months for as long as you are taking PrEP. Pregnancy  If you are premenopausal and you may become pregnant, ask your health care provider about preconception counseling.  If you may become pregnant, take 400 to 800 micrograms (mcg) of folic acid every day.  If you want to prevent pregnancy, talk to your health care provider about birth control (contraception). Osteoporosis and menopause  Osteoporosis is a disease in which the bones lose minerals and strength with aging. This can result in serious bone fractures. Your risk for osteoporosis can be identified using a bone density scan.  If you are 59 years of age or  older, or if you are at risk for osteoporosis and fractures, ask your health care provider if you should be screened.  Ask your health care provider whether you should take a calcium or vitamin D supplement to lower your risk for osteoporosis.  Menopause may have certain physical symptoms and risks.  Hormone replacement therapy may reduce some of these symptoms and risks. Talk to your health care provider about whether hormone replacement therapy is right for you. Follow these instructions at home:  Schedule regular health, dental, and eye exams.  Stay current with your immunizations.  Do not use any tobacco products including cigarettes, chewing tobacco, or electronic cigarettes.  If you are pregnant, do not drink alcohol.  If you are breastfeeding, limit how much and how often you drink alcohol.  Limit alcohol intake to no more than 1 drink per day for nonpregnant women. One drink equals 12 ounces of beer, 5 ounces of Lexys Milliner, or 1 ounces of hard liquor.  Do not use street drugs.  Do not share needles.  Ask your health care provider for help if you need support or information about quitting drugs.  Tell your health care provider if you often feel depressed.  Tell your health care provider if you have ever been abused or do not feel safe at home. This information is not intended to replace advice given to you by your health care provider. Make sure you discuss any questions you have with your health care provider. Document Released: 06/26/2011 Document Revised: 05/18/2016 Document Reviewed: 09/14/2015 Elsevier Interactive Patient Education  [employer].

## 2018-07-18 NOTE — Progress Notes (Signed)
Subjective:  Patient ID: [name], female    DOB: October 28, 1941  Age: 77 y.o. MRN: [id number]  CC: Annual Exam; Hypothyroidism; Hyperlipidemia; and Atrial Fibrillation   HPI [name] presents for a CPX.  She is very active, she tells me she walks 10,000 steps per day.  She denies any recent episodes of CP, DOE, palpitations, edema, or fatigue.  She complains of weight gain.  Outpatient Medications Prior to Visit  Medication Sig Dispense Refill  . flecainide (TAMBOCOR) 100 MG tablet TAKE ONE TABLET EVERY TWELVE HOURS. 180 tablet 3  . Melatonin 10 MG CAPS Take 10 mg by mouth at bedtime.    . metoprolol tartrate (LOPRESSOR) 25 MG tablet TAKE (1/2) TABLET TWICE DAILY. 90 tablet 0  . XARELTO 20 MG TABS tablet TAKE 1 TABLET ONCE DAILY. 90 tablet 0  . levothyroxine (SYNTHROID, LEVOTHROID) 50 MCG tablet TAKE 1/2 TABLET DAILY, EXCEPT 1 TABLET ON TUESDAY, THURSDAY AND SATURDAY. 64 tablet 0   No facility-administered medications prior to visit.     ROS Review of Systems  Constitutional: Positive for unexpected weight change. Negative for activity change, appetite change, diaphoresis and fatigue.  HENT: Negative.   Respiratory: Negative.  Negative for apnea, cough, shortness of breath and wheezing.   Cardiovascular: Negative.  Negative for chest pain, palpitations and leg swelling.  Gastrointestinal: Negative for abdominal pain, constipation, diarrhea, nausea and vomiting.  Endocrine: Negative.   Genitourinary: Negative.  Negative for difficulty urinating.  Musculoskeletal: Negative.  Negative for arthralgias and myalgias.  Skin: Negative.  Negative for rash.  Neurological: Negative for dizziness, weakness and light-headedness.  Hematological: Negative for adenopathy. Does not bruise/bleed easily.  Psychiatric/Behavioral: Negative.  Negative for dysphoric mood. The patient is not nervous/anxious.     Objective:  BP 128/68 (BP Location: Left Arm, Patient Position: Sitting, Cuff  Size: Large)   Pulse 63   Temp 97.7 F (36.5 C) (Oral)   Ht 5\' 6"  (1.676 m)   Wt 207 lb (93.9 kg)   SpO2 98%   BMI 33.41 kg/m   BP Readings from Last 3 Encounters:  07/18/18 128/68  07/18/18 128/68  07/16/17 (!) 146/82    Wt Readings from Last 3 Encounters:  07/18/18 207 lb (93.9 kg)  07/18/18 207 lb (93.9 kg)  07/16/17 191 lb (86.6 kg)    Physical Exam  Constitutional: No distress.  HENT:  Mouth/Throat: Oropharynx is clear and moist. No oropharyngeal exudate.  Eyes: Conjunctivae are normal. No scleral icterus.  Neck: Normal range of motion. Neck supple. No JVD present. No thyromegaly present.  Cardiovascular: Normal rate, regular rhythm and normal heart sounds. Exam reveals no gallop.  No murmur heard. Pulmonary/Chest: Effort normal and breath sounds normal. She has no wheezes. She has no rhonchi. She has no rales.  Abdominal: Soft. Bowel sounds are normal. She exhibits no mass. There is no hepatosplenomegaly. There is no tenderness.  Genitourinary:  Genitourinary Comments: Breast, GU, rectal exams were deferred at patient's request.  Musculoskeletal: Normal range of motion. She exhibits no edema, tenderness or deformity.  Lymphadenopathy:    She has no cervical adenopathy.  Neurological: She is alert.  Skin: Skin is warm and dry. She is not diaphoretic. No pallor.  Psychiatric: She has a normal mood and affect. Her behavior is normal. Judgment and thought content normal.  Vitals reviewed.   Lab Results  Component Value Date   WBC 7.7 07/18/2018   HGB 12.1 07/18/2018   HCT 36.7 07/18/2018   PLT 304.0  07/18/2018   GLUCOSE 94 07/18/2018   CHOL 222 (H) 07/18/2018   TRIG 64.0 07/18/2018   HDL 65.70 07/18/2018   LDLDIRECT 127.1 07/08/2013   LDLCALC 143 (H) 07/18/2018   ALT 10 07/18/2018   AST 16 07/18/2018   NA 141 07/18/2018   K 4.6 07/18/2018   CL 103 07/18/2018   CREATININE 1.04 07/18/2018   BUN 23 07/18/2018   CO2 29 07/18/2018   TSH 3.64 07/18/2018    INR 2.79 (H) 10/09/2014   HGBA1C 5.5 03/13/2008   MICROALBUR 0.8 12/03/2006    Mm Screening Breast Tomo Bilateral  Result Date: 04/12/2018 CLINICAL DATA:  Screening. EXAM: DIGITAL SCREENING BILATERAL MAMMOGRAM WITH TOMO AND CAD COMPARISON:  Previous exam(s). ACR Breast Density Category b: There are scattered areas of fibroglandular density. FINDINGS: There are no findings suspicious for malignancy. Images were processed with CAD. IMPRESSION: No mammographic evidence of malignancy. A result letter of this screening mammogram will be mailed directly to the patient. RECOMMENDATION: Screening mammogram in one year. (Code:[id number]-B-01Y) BI-RADS CATEGORY  1: Negative. Electronically Signed   By: Franki Cabot M.D.   On: 04/12/2018 15:09    Assessment & Plan:   [name] was seen today for annual exam, hypothyroidism, hyperlipidemia and atrial fibrillation.  Diagnoses and all orders for this visit:  Acquired hypothyroidism- Her TSH is in the normal range.  Will continue the current levothyroxine dose but instead of taking it the way it was previously prescribed she will take 1/2 tablet every day. -     TSH; Future -     levothyroxine (SYNTHROID, LEVOTHROID) 50 MCG tablet; Take 0.5 tablets (25 mcg total) by mouth daily before breakfast.  Pure hypercholesterolemia- Shas an elevated ASCVD risk score so I have asked her to take a statin for CV risk reduction. -     Lipid panel; Future -     Comprehensive metabolic panel; Future -     rosuvastatin (CRESTOR) 10 MG tablet; Take 1 tablet (10 mg total) by mouth daily.  Vitamin D deficiency- Her vitamin D level is in the normal range. -     VITAMIN D 25 Hydroxy (Vit-D Deficiency, Fractures); Future  Routine general medical examination at a health care facility  Atrial flutter, paroxysmal ([place])- She is maintaining good rate and rhythm control.  Will continue anticoagulation with Xarelto. -     CBC with Differential/Platelet; Future  Hypothyroidism, unspecified  type -     levothyroxine (SYNTHROID, LEVOTHROID) 50 MCG tablet; Take 0.5 tablets (25 mcg total) by mouth daily before breakfast.  Other orders -     Zoster Vaccine Adjuvanted [facility]) injection; Inject 0.5 mLs into the muscle once for 1 dose.   I have changed [name]'s levothyroxine. I am also having her start on Zoster Vaccine Adjuvanted and rosuvastatin. Additionally, I am having her maintain her flecainide, metoprolol tartrate, XARELTO, and Melatonin.  Meds ordered this encounter  Medications  . Zoster Vaccine Adjuvanted [facility]) injection    Sig: Inject 0.5 mLs into the muscle once for 1 dose.    Dispense:  0.5 mL    Refill:  1  . rosuvastatin (CRESTOR) 10 MG tablet    Sig: Take 1 tablet (10 mg total) by mouth daily.    Dispense:  90 tablet    Refill:  1  . levothyroxine (SYNTHROID, LEVOTHROID) 50 MCG tablet    Sig: Take 0.5 tablets (25 mcg total) by mouth daily before breakfast.    Dispense:  45 tablet  Refill:  1     Follow-up: Return in about 6 months (around 01/18/2019).  Scarlette Calico, MD

## 2018-07-18 NOTE — Patient Instructions (Signed)
Hypothyroidism Hypothyroidism is a disorder of the thyroid. The thyroid is a large gland that is located in the lower front of the neck. The thyroid releases hormones that control how the body works. With hypothyroidism, the thyroid does not make enough of these hormones. What are the causes? Causes of hypothyroidism may include:  Viral infections.  Pregnancy.  Your own defense system (immune system) attacking your thyroid.  Certain medicines.  Birth defects.  Past radiation treatments to your head or neck.  Past treatment with radioactive iodine.  Past surgical removal of part or all of your thyroid.  Problems with the gland that is located in the center of your brain (pituitary).  What are the signs or symptoms? Signs and symptoms of hypothyroidism may include:  Feeling as though you have no energy (lethargy).  Inability to tolerate cold.  Weight gain that is not explained by a change in diet or exercise habits.  Dry skin.  Coarse hair.  Menstrual irregularity.  Slowing of thought processes.  Constipation.  Sadness or depression.  How is this diagnosed? Your health care provider may diagnose hypothyroidism with blood tests and ultrasound tests. How is this treated? Hypothyroidism is treated with medicine that replaces the hormones that your body does not make. After you begin treatment, it may take several weeks for symptoms to go away. Follow these instructions at home:  Take medicines only as directed by your health care provider.  If you start taking any new medicines, tell your health care provider.  Keep all follow-up visits as directed by your health care provider. This is important. As your condition improves, your dosage needs may change. You will need to have blood tests regularly so that your health care provider can watch your condition. Contact a health care provider if:  Your symptoms do not get better with treatment.  You are taking thyroid  replacement medicine and: ? You sweat excessively. ? You have tremors. ? You feel anxious. ? You lose weight rapidly. ? You cannot tolerate heat. ? You have emotional swings. ? You have diarrhea. ? You feel weak. Get help right away if:  You develop chest pain.  You develop an irregular heartbeat.  You develop a rapid heartbeat. This information is not intended to replace advice given to you by your health care provider. Make sure you discuss any questions you have with your health care provider. Document Released: 12/11/2005 Document Revised: 05/18/2016 Document Reviewed: 04/28/2014 Elsevier Interactive Patient Education  2018 [employer]  

## 2018-07-26 ENCOUNTER — Ambulatory Visit: Payer: Medicare Other | Admitting: Gynecology

## 2018-07-26 ENCOUNTER — Encounter: Payer: Self-pay | Admitting: Gynecology

## 2018-07-26 VITALS — BP 126/82 | Ht 66.0 in | Wt 207.0 lb

## 2018-07-26 DIAGNOSIS — N841 Polyp of cervix uteri: Secondary | ICD-10-CM | POA: Diagnosis not present

## 2018-07-26 DIAGNOSIS — N950 Postmenopausal bleeding: Secondary | ICD-10-CM | POA: Diagnosis not present

## 2018-07-26 DIAGNOSIS — Z124 Encounter for screening for malignant neoplasm of cervix: Secondary | ICD-10-CM

## 2018-07-26 DIAGNOSIS — R87618 Other abnormal cytological findings on specimens from cervix uteri: Secondary | ICD-10-CM | POA: Diagnosis not present

## 2018-07-26 NOTE — Patient Instructions (Signed)
Follow up for ultrasound as scheduled 

## 2018-07-26 NOTE — Progress Notes (Addendum)
    [name] 14-Apr-1941 [id number]        77 y.o.  G4P4 presents having not been in the office for a number of years complaining of postmenopausal bleeding this morning while wiping.  No pain/cramping.  Patient's definite it was vaginal.  Currently on Xarelto with history of DVT.  Past medical history,surgical history, problem list, medications, allergies, family history and social history were all reviewed and documented in the EPIC chart.  Directed ROS with pertinent positives and negatives documented in the history of present illness/assessment and plan.  Exam: Copywriter, advertising Vitals:   07/26/18 1217  BP: 126/82  Weight: 207 lb (93.9 kg)  Height: 5\' 6"  (1.676 m)   General appearance:  Normal Abdomen soft nontender without masses guarding rebound Pelvic external BUS vagina normal.  Cervix with polypoid bulbous area extruding from the os.  Pap smear surrounding and including this area taken.  Friable to manipulation.  Uterus grossly normal size midline mobile nontender.  Adnexa without masses or tenderness.  Assessment/Plan:  77 y.o. G4P4 with episode of postmenopausal bleeding.  Exam shows a polypoid mass at the cervix.  Discussed with the patient whether this represents a cervical polyp or an endometrial polyp that has progressed down to the external os.  I did a Pap smear around and over this area.  Recommend patient schedule a sonohysterogram to clear the endometrium.  If endometrial polyps visualized then we will plan on hysteroscopy D&C with resection of this ectocervical lesion as well as the polyps.  If no polyps visualized then will plan on excising the ectocervical region at the same time as the sonohysterogram.  We discussed the differential of postmenopausal bleeding to include atrophic, attributable to her Xarelto, hyperplastic/polyps and endometrial cancer.  Patient will follow-up for the sonohysterogram and then will go from there.  I spent a total of 30 face-to-face  minutes with the patient, over 50% was spent counseling and coordination of care.     Anastasio Auerbach MD, 1:09 PM 07/26/2018

## 2018-07-30 LAB — PAP IG AND HPV HIGH-RISK: HPV DNA HIGH RISK: NOT DETECTED

## 2018-08-05 ENCOUNTER — Other Ambulatory Visit: Payer: Self-pay | Admitting: Gynecology

## 2018-08-05 DIAGNOSIS — N950 Postmenopausal bleeding: Secondary | ICD-10-CM

## 2018-08-19 ENCOUNTER — Ambulatory Visit: Payer: Medicare Other | Admitting: Gynecology

## 2018-08-19 ENCOUNTER — Ambulatory Visit (INDEPENDENT_AMBULATORY_CARE_PROVIDER_SITE_OTHER): Payer: Medicare Other

## 2018-08-19 ENCOUNTER — Encounter: Payer: Self-pay | Admitting: Gynecology

## 2018-08-19 VITALS — BP 130/80

## 2018-08-19 DIAGNOSIS — N841 Polyp of cervix uteri: Secondary | ICD-10-CM

## 2018-08-19 DIAGNOSIS — N950 Postmenopausal bleeding: Secondary | ICD-10-CM | POA: Diagnosis not present

## 2018-08-19 DIAGNOSIS — R102 Pelvic and perineal pain: Secondary | ICD-10-CM | POA: Diagnosis not present

## 2018-08-19 MED ORDER — LIDOCAINE HCL 1 % IJ SOLN
10.0000 mL | Freq: Once | INTRAMUSCULAR | Status: AC
  Administered 2018-08-19: 10 mL

## 2018-08-19 NOTE — Progress Notes (Addendum)
    DARCEY CARDY 1941/04/08 [id number]        77 y.o.  G4P4 presents for sonohysterogram with a history of postmenopausal bleeding earlier this month with wiping.  On Xarelto for DVT.  Exam showed a polypoid bulbous area extruding from the os.  Pap smear showed reactive changes with benign endometrial cells  Past medical history,surgical history, problem list, medications, allergies, family history and social history were all reviewed and documented in the EPIC chart.  Directed ROS with pertinent positives and negatives documented in the history of present illness/assessment and plan.  Exam: [place] assistant BP 130/80 General appearance:  Normal Abdomen soft nontender without masses guarding rebound Pelvic external BUS vagina with atrophic changes.  Cervix with polypoid lesion extruding from the office.  Bimanual without masses or tenderness.  Ultrasound transvaginal and transabdominal shows uterus normal size and echotexture.  Endometrial echo 7.4 mm.  Right and left ovaries not visualized.  No adnexal pathology noted.  Cul-de-sac negative.  Sonohysterogram performed after Betadine cleanse to upper vagina and cervix.  Initial attempts to pass the catheter unsuccessful.  Subsequently anterior lip of the cervix was infiltrated using 1% lidocaine, grasped with a single-tooth tenaculum and the catheter was then was placed without difficulty.  Sonohysterogram performed with good distention and no filling defects noted.  Endometrial sample taken.  Patient tolerated well.  Assessment/Plan:  77 y.o. G4P4 with large polypoid mass from the external loss consistent with endocervical or endometrial polyp.  A Pap smear of this area last visit did show reactive changes and endometrial cells.  Sonohysterogram today shows prominent endometrial cavity but no defects.  I reviewed with the patient and her husband the differential to include a endometrial polyp with a long stalk versus endocervical polyp.   Patient will follow-up for biopsy results.  Assuming negative then we discussed best options for excising the cervical polyp.  Attempts at an office with LEEP versus surgical center hysteroscopy D&C with excision of this area discussed.  I think given the total picture to include her Xarelto that we will proceed with a surgical center procedure to include excision of the polypoid mass and D&C with hysteroscopy.  If atrial biopsy from today shows significant atypia then will rediscuss treatment options to include possible hysterectomy.    Anastasio Auerbach MD, 11:10 AM 08/19/2018

## 2018-08-19 NOTE — Patient Instructions (Signed)
Office will call you with biopsy results from the ultrasound and to arrange for the subsequent D&C.

## 2018-08-21 ENCOUNTER — Telehealth: Payer: Self-pay

## 2018-08-21 ENCOUNTER — Other Ambulatory Visit: Payer: Self-pay | Admitting: Gynecology

## 2018-08-21 MED ORDER — MISOPROSTOL 100 MCG PO TABS: ORAL_TABLET | ORAL | 0 refills | Status: DC

## 2018-08-21 NOTE — Telephone Encounter (Signed)
I called patient to discuss scheduling surgery. Her insurance pays MD 100% so no surgery prepymt due.  She wanted to schedule first available and that is 09/11/18 at 10:15am at [facility].  I will mail her a phamphlet from [facility].  I advised her about the need for Cyotec tab intravaginally hs before surgery and Rx was sent. Warned her she might have spotting/bleeding or cramping overnight after inserting the tablet but no worries just means it is working.

## 2018-08-22 ENCOUNTER — Telehealth: Payer: Self-pay

## 2018-08-22 ENCOUNTER — Other Ambulatory Visit: Payer: Self-pay

## 2018-08-22 NOTE — Telephone Encounter (Signed)
Spoke with patient and read her Dr. Dorette Grate note. She will check on this. I asked her to call me and let me know what her physician has her do in regards to [place]. I think I may be able to see it in her Epic chart as I do see her Internal Med MD is in [place].

## 2018-08-22 NOTE — Telephone Encounter (Signed)
Dr. Loetta Rough sent me this staff message: "Forgot to mention the patient is on Xarelto and she will need to check with her Xarelto doctor about management before the hysteroscopy D&C. Usually they just asked them to stop it 2 days before hand but she needs to verify with her physician."

## 2018-08-23 ENCOUNTER — Telehealth (HOSPITAL_COMMUNITY): Payer: Self-pay

## 2018-08-23 NOTE — Telephone Encounter (Signed)
Hold for 2 days prior

## 2018-08-23 NOTE — Telephone Encounter (Signed)
Left VM for pt on mobile number

## 2018-08-23 NOTE — Telephone Encounter (Signed)
Pt called to state that she is having a D&C on Sept 18th and wants to know what she do about her Xarelto. Please advise.

## 2018-08-27 ENCOUNTER — Encounter: Payer: Self-pay | Admitting: Gynecology

## 2018-09-03 ENCOUNTER — Encounter: Payer: Self-pay | Admitting: Gynecology

## 2018-09-03 ENCOUNTER — Encounter (HOSPITAL_BASED_OUTPATIENT_CLINIC_OR_DEPARTMENT_OTHER): Payer: Self-pay

## 2018-09-03 ENCOUNTER — Other Ambulatory Visit: Payer: Self-pay

## 2018-09-03 ENCOUNTER — Ambulatory Visit: Payer: Medicare Other | Admitting: Gynecology

## 2018-09-03 VITALS — BP 124/82

## 2018-09-03 DIAGNOSIS — N950 Postmenopausal bleeding: Secondary | ICD-10-CM | POA: Diagnosis not present

## 2018-09-03 DIAGNOSIS — N841 Polyp of cervix uteri: Secondary | ICD-10-CM

## 2018-09-03 NOTE — Progress Notes (Addendum)
    [name] 10/29/1941 [id number]        77 y.o.  G4P4 presents preoperatively for her upcoming hysteroscopy D&C with resection of the cervical polyp.  History of postmenopausal bleeding where exam showed a polypoid mass from the cervix.  Sonohysterogram showed endometrial echo 7.4 mm.  Cavity without evidence of endometrial polyps.  Endometrial biopsy showed atrophic endometrium.  Pap smear was benign noting endometrial cells.  Past medical history,surgical history, problem list, medications, allergies, family history and social history were all reviewed and documented in the EPIC chart.  Directed ROS with pertinent positives and negatives documented in the history of present illness/assessment and plan.  Exam: Copywriter, advertising Vitals:   09/03/18 1446  BP: 124/82   General appearance:  Normal HEENT normal Lungs clear Cardiac regular rate no rubs murmurs or gallops Abdomen soft nontender without masses guarding rebound Pelvic external BUS vagina with atrophic changes.  Cervix with polypoid lesion extruding from the external os.  Uterus grossly normal size midline mobile nontender.  Adnexa without masses or tenderness  Assessment/Plan:  77 y.o. G4P4 with polypoid mass from the cervix for excision using probable LEEP.  We will also perform hysteroscopy D&C to clear endometrial origin.  I reviewed the proposed surgery with the patient to include the expected intraoperative and postoperative courses as well as the recovery period. The use of the hysteroscope, resectoscope and the D&C portion were all discussed.  Use of the LEEP procedure with electrical wire also discussed.  The risks of surgery to include infection, prolonged antibiotics, hemorrhage necessitating transfusion and the risks of transfusion were all discussed understood and accepted. The risk of damage to internal organs during the procedure, either immediately recognized or delay recognized, including vagina, cervix, uterus,  possible perforation causing damage to bowel, bladder, ureters, vessels and nerves necessitating major exploratory reparative surgery and future reparative surgeries including bladder repair, ureteral damage repair, bowel resection, ostomy formation was also discussed understood and accepted.  Patient is on Xarelto and has spoken to her physician who recommends stopping the medication 2 days before the procedure and then will start this the day after the procedure. The patient's questions were answered to her satisfaction and she is ready to proceed with surgery.     Anastasio Auerbach MD, 3:16 PM 09/03/2018

## 2018-09-03 NOTE — Patient Instructions (Signed)
Followup for surgery as scheduled. 

## 2018-09-03 NOTE — Progress Notes (Signed)
Spoke with:  [name] NPO:  After Midnight, no gum, candy, or mints   Arrival time:  0815AM Labs:  (CBC, CMP, T&S, EKG 09/04/2018 in epic) AM medications: Flecainide, Levothyroxine, Metoprolol, Rosuvastatin Pre op orders:  Yes Ride home:  [name] (husband) [phone]
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
## 2018-09-04 ENCOUNTER — Encounter (HOSPITAL_COMMUNITY)
Admission: RE | Admit: 2018-09-04 | Discharge: 2018-09-04 | Disposition: A | Discharge status: Home / Self Care | Payer: Medicare Other | Source: Ambulatory Visit | Attending: Gynecology | Admitting: Gynecology

## 2018-09-04 DIAGNOSIS — I440 Atrioventricular block, first degree: Secondary | ICD-10-CM | POA: Diagnosis not present

## 2018-09-04 DIAGNOSIS — I447 Left bundle-branch block, unspecified: Secondary | ICD-10-CM | POA: Insufficient documentation

## 2018-09-04 DIAGNOSIS — Z01810 Encounter for preprocedural cardiovascular examination: Secondary | ICD-10-CM | POA: Diagnosis not present

## 2018-09-04 DIAGNOSIS — Z01812 Encounter for preprocedural laboratory examination: Secondary | ICD-10-CM | POA: Diagnosis not present

## 2018-09-04 DIAGNOSIS — R9431 Abnormal electrocardiogram [ECG] [EKG]: Secondary | ICD-10-CM | POA: Diagnosis not present

## 2018-09-04 LAB — CBC
HCT: 34.6 % — ABNORMAL LOW (ref 36.0–46.0)
Hemoglobin: 10.8 g/dL — ABNORMAL LOW (ref 12.0–15.0)
MCH: 28.3 pg (ref 26.0–34.0)
MCHC: 31.2 g/dL (ref 30.0–36.0)
MCV: 90.8 fL (ref 78.0–100.0)
Platelets: 368 10*3/uL (ref 150–400)
RBC: 3.81 MIL/uL — AB (ref 3.87–5.11)
RDW: 13.3 % (ref 11.5–15.5)
WBC: 6.1 10*3/uL (ref 4.0–10.5)

## 2018-09-04 LAB — COMPREHENSIVE METABOLIC PANEL
ALT: 16 U/L (ref 0–44)
ANION GAP: 10 (ref 5–15)
AST: 29 U/L (ref 15–41)
Albumin: 4.1 g/dL (ref 3.5–5.0)
Alkaline Phosphatase: 63 U/L (ref 38–126)
BUN: 22 mg/dL (ref 8–23)
CO2: 25 mmol/L (ref 22–32)
Calcium: 9.3 mg/dL (ref 8.9–10.3)
Chloride: 109 mmol/L (ref 98–111)
Creatinine, Ser: 0.97 mg/dL (ref 0.44–1.00)
GFR, EST NON AFRICAN AMERICAN: 55 mL/min — AB (ref >60)
Glucose, Bld: 123 mg/dL — ABNORMAL HIGH (ref 70–99)
POTASSIUM: 4.5 mmol/L (ref 3.5–5.1)
SODIUM: 144 mmol/L (ref 135–145)
Total Bilirubin: 0.8 mg/dL (ref 0.3–1.2)
Total Protein: 7.3 g/dL (ref 6.5–8.1)

## 2018-09-04 NOTE — Pre-Procedure Instructions (Signed)
CBC, CMP results 09/04/2018 faxed to Dr. Phineas Real via epic

## 2018-09-05 LAB — TYPE AND SCREEN
ABO/RH(D): A POS
ANTIBODY SCREEN: POSITIVE
PT AG TYPE: NEGATIVE

## 2018-09-05 NOTE — Pre-Procedure Instructions (Signed)
Reviewed medical history with Dr. Kalman Shan, no further cardiac or medical clearance needed for procedure 09/11/2018.

## 2018-09-09 ENCOUNTER — Encounter: Payer: Self-pay | Admitting: Gynecology

## 2018-09-11 ENCOUNTER — Ambulatory Visit (HOSPITAL_BASED_OUTPATIENT_CLINIC_OR_DEPARTMENT_OTHER): Payer: Medicare Other | Admitting: Anesthesiology

## 2018-09-11 ENCOUNTER — Ambulatory Visit (HOSPITAL_COMMUNITY)
Admission: RE | Admit: 2018-09-11 | Discharge: 2018-09-11 | Disposition: A | Discharge status: Home / Self Care | Payer: Medicare Other | Source: Ambulatory Visit | Attending: Gynecology | Admitting: Gynecology

## 2018-09-11 ENCOUNTER — Encounter (HOSPITAL_BASED_OUTPATIENT_CLINIC_OR_DEPARTMENT_OTHER)
Admission: RE | Disposition: A | Discharge status: Home / Self Care | Payer: Self-pay | Source: Ambulatory Visit | Attending: Gynecology

## 2018-09-11 ENCOUNTER — Encounter (HOSPITAL_BASED_OUTPATIENT_CLINIC_OR_DEPARTMENT_OTHER): Payer: Self-pay

## 2018-09-11 DIAGNOSIS — Z86711 Personal history of pulmonary embolism: Secondary | ICD-10-CM | POA: Insufficient documentation

## 2018-09-11 DIAGNOSIS — Z79899 Other long term (current) drug therapy: Secondary | ICD-10-CM | POA: Insufficient documentation

## 2018-09-11 DIAGNOSIS — I4510 Unspecified right bundle-branch block: Secondary | ICD-10-CM | POA: Diagnosis not present

## 2018-09-11 DIAGNOSIS — Z86718 Personal history of other venous thrombosis and embolism: Secondary | ICD-10-CM | POA: Diagnosis not present

## 2018-09-11 DIAGNOSIS — I440 Atrioventricular block, first degree: Secondary | ICD-10-CM | POA: Diagnosis not present

## 2018-09-11 DIAGNOSIS — Z880 Allergy status to penicillin: Secondary | ICD-10-CM | POA: Diagnosis not present

## 2018-09-11 DIAGNOSIS — Z6833 Body mass index (BMI) 33.0-33.9, adult: Secondary | ICD-10-CM | POA: Diagnosis not present

## 2018-09-11 DIAGNOSIS — N888 Other specified noninflammatory disorders of cervix uteri: Secondary | ICD-10-CM | POA: Insufficient documentation

## 2018-09-11 DIAGNOSIS — I509 Heart failure, unspecified: Secondary | ICD-10-CM | POA: Diagnosis not present

## 2018-09-11 DIAGNOSIS — Z7901 Long term (current) use of anticoagulants: Secondary | ICD-10-CM | POA: Diagnosis not present

## 2018-09-11 DIAGNOSIS — Z885 Allergy status to narcotic agent status: Secondary | ICD-10-CM | POA: Insufficient documentation

## 2018-09-11 DIAGNOSIS — M1990 Unspecified osteoarthritis, unspecified site: Secondary | ICD-10-CM | POA: Insufficient documentation

## 2018-09-11 DIAGNOSIS — N950 Postmenopausal bleeding: Secondary | ICD-10-CM | POA: Insufficient documentation

## 2018-09-11 DIAGNOSIS — I480 Paroxysmal atrial fibrillation: Secondary | ICD-10-CM | POA: Insufficient documentation

## 2018-09-11 DIAGNOSIS — E785 Hyperlipidemia, unspecified: Secondary | ICD-10-CM | POA: Insufficient documentation

## 2018-09-11 DIAGNOSIS — N889 Noninflammatory disorder of cervix uteri, unspecified: Secondary | ICD-10-CM

## 2018-09-11 DIAGNOSIS — Z96653 Presence of artificial knee joint, bilateral: Secondary | ICD-10-CM | POA: Insufficient documentation

## 2018-09-11 DIAGNOSIS — E039 Hypothyroidism, unspecified: Secondary | ICD-10-CM | POA: Insufficient documentation

## 2018-09-11 DIAGNOSIS — Z79890 Hormone replacement therapy (postmenopausal): Secondary | ICD-10-CM | POA: Diagnosis not present

## 2018-09-11 DIAGNOSIS — N841 Polyp of cervix uteri: Secondary | ICD-10-CM | POA: Diagnosis not present

## 2018-09-11 DIAGNOSIS — E669 Obesity, unspecified: Secondary | ICD-10-CM | POA: Diagnosis not present

## 2018-09-11 DIAGNOSIS — N858 Other specified noninflammatory disorders of uterus: Secondary | ICD-10-CM | POA: Diagnosis not present

## 2018-09-11 HISTORY — DX: Heart failure, unspecified: I50.9

## 2018-09-11 HISTORY — DX: Bradycardia, unspecified: R00.1

## 2018-09-11 HISTORY — DX: Atrioventricular block, first degree: I44.0

## 2018-09-11 HISTORY — DX: Obesity, unspecified: E66.9

## 2018-09-11 HISTORY — DX: Postmenopausal bleeding: N95.0

## 2018-09-11 HISTORY — DX: Presence of spectacles and contact lenses: Z97.3

## 2018-09-11 HISTORY — DX: Unspecified right bundle-branch block: I45.10

## 2018-09-11 HISTORY — DX: Polyp of corpus uteri: N84.0

## 2018-09-11 HISTORY — PX: LEEP: SHX91

## 2018-09-11 HISTORY — PX: HYSTEROSCOPY WITH D & C: SHX1775

## 2018-09-11 HISTORY — DX: Hypothyroidism, unspecified: E03.9

## 2018-09-11 HISTORY — DX: Cardiomegaly: I51.7

## 2018-09-11 SURGERY — DILATATION AND CURETTAGE /HYSTEROSCOPY
Anesthesia: General | Site: Uterus

## 2018-09-11 MED ORDER — CIPROFLOXACIN IN D5W 400 MG/200ML IV SOLN
INTRAVENOUS | Status: AC
  Filled 2018-09-11: qty 200

## 2018-09-11 MED ORDER — FENTANYL CITRATE (PF) 100 MCG/2ML IJ SOLN
INTRAMUSCULAR | Status: AC
  Filled 2018-09-11: qty 2

## 2018-09-11 MED ORDER — METRONIDAZOLE IN NACL 5-0.79 MG/ML-% IV SOLN
INTRAVENOUS | Status: AC
  Filled 2018-09-11: qty 100

## 2018-09-11 MED ORDER — SODIUM CHLORIDE 0.9 % IR SOLN
Status: DC | PRN
  Administered 2018-09-11: 1

## 2018-09-11 MED ORDER — LIDOCAINE 2% (20 MG/ML) 5 ML SYRINGE
INTRAMUSCULAR | Status: AC
  Filled 2018-09-11: qty 5

## 2018-09-11 MED ORDER — ONDANSETRON HCL 4 MG/2ML IJ SOLN
4.0000 mg | Freq: Once | INTRAMUSCULAR | Status: DC | PRN
  Filled 2018-09-11: qty 2

## 2018-09-11 MED ORDER — METRONIDAZOLE IN NACL 5-0.79 MG/ML-% IV SOLN
500.0000 mg | INTRAVENOUS | Status: AC
  Administered 2018-09-11: 500 mg via INTRAVENOUS
  Filled 2018-09-11: qty 100

## 2018-09-11 MED ORDER — PROPOFOL 10 MG/ML IV BOLUS
INTRAVENOUS | Status: AC
  Filled 2018-09-11: qty 20

## 2018-09-11 MED ORDER — KETOROLAC TROMETHAMINE 30 MG/ML IJ SOLN
INTRAMUSCULAR | Status: DC | PRN
  Administered 2018-09-11: 15 mg via INTRAVENOUS

## 2018-09-11 MED ORDER — OXYCODONE-ACETAMINOPHEN 5-325 MG PO TABS: 1.0000 | ORAL_TABLET | ORAL | 0 refills | Status: DC | PRN

## 2018-09-11 MED ORDER — DEXAMETHASONE SODIUM PHOSPHATE 10 MG/ML IJ SOLN
INTRAMUSCULAR | Status: DC | PRN
  Administered 2018-09-11: 10 mg via INTRAVENOUS

## 2018-09-11 MED ORDER — PROPOFOL 10 MG/ML IV BOLUS
INTRAVENOUS | Status: DC | PRN
  Administered 2018-09-11: 100 mg via INTRAVENOUS

## 2018-09-11 MED ORDER — LIDOCAINE-EPINEPHRINE (PF) 1 %-1:200000 IJ SOLN
INTRAMUSCULAR | Status: DC | PRN
  Administered 2018-09-11: 10 mL

## 2018-09-11 MED ORDER — ONDANSETRON HCL 4 MG/2ML IJ SOLN
INTRAMUSCULAR | Status: AC
  Filled 2018-09-11: qty 2

## 2018-09-11 MED ORDER — CIPROFLOXACIN IN D5W 400 MG/200ML IV SOLN
400.0000 mg | INTRAVENOUS | Status: AC
  Administered 2018-09-11: 400 mg via INTRAVENOUS
  Filled 2018-09-11: qty 200

## 2018-09-11 MED ORDER — OXYCODONE HCL 5 MG/5ML PO SOLN
5.0000 mg | Freq: Once | ORAL | Status: DC | PRN
  Filled 2018-09-11: qty 5

## 2018-09-11 MED ORDER — FENTANYL CITRATE (PF) 100 MCG/2ML IJ SOLN
INTRAMUSCULAR | Status: DC | PRN
  Administered 2018-09-11: 50 ug via INTRAVENOUS

## 2018-09-11 MED ORDER — FENTANYL CITRATE (PF) 100 MCG/2ML IJ SOLN
25.0000 ug | INTRAMUSCULAR | Status: DC | PRN
  Filled 2018-09-11: qty 1

## 2018-09-11 MED ORDER — OXYCODONE HCL 5 MG PO TABS
5.0000 mg | ORAL_TABLET | Freq: Once | ORAL | Status: DC | PRN
  Filled 2018-09-11: qty 1

## 2018-09-11 MED ORDER — LACTATED RINGERS IV SOLN
INTRAVENOUS | Status: DC
  Administered 2018-09-11: 09:00:00 via INTRAVENOUS
  Filled 2018-09-11: qty 1000

## 2018-09-11 MED ORDER — ONDANSETRON HCL 4 MG/2ML IJ SOLN
INTRAMUSCULAR | Status: DC | PRN
  Administered 2018-09-11: 4 mg via INTRAVENOUS

## 2018-09-11 MED ORDER — DEXAMETHASONE SODIUM PHOSPHATE 10 MG/ML IJ SOLN
INTRAMUSCULAR | Status: AC
  Filled 2018-09-11: qty 1

## 2018-09-11 MED ORDER — LIDOCAINE 2% (20 MG/ML) 5 ML SYRINGE
INTRAMUSCULAR | Status: DC | PRN
  Administered 2018-09-11: 75 mg via INTRAVENOUS

## 2018-09-11 SURGICAL SUPPLY — 53 items
APL SWBSTK 6 STRL LF DISP (MISCELLANEOUS) ×1
APPLICATOR COTTON TIP 6 STRL (MISCELLANEOUS) ×1 IMPLANT
APPLICATOR COTTON TIP 6IN STRL (MISCELLANEOUS) ×3 IMPLANT
BAG DECANTER FOR FLEXI CONT (MISCELLANEOUS) IMPLANT
BIPOLAR CUTTING LOOP 21FR (ELECTRODE)
BLADE SURG 11 STRL SS (BLADE) IMPLANT
CANISTER SUCT 3000ML PPV (MISCELLANEOUS) ×3 IMPLANT
CANISTER SUCTION 1200CC (MISCELLANEOUS) IMPLANT
CATH ROBINSON RED A/P 16FR (CATHETERS) ×3 IMPLANT
CLOTH BEACON ORANGE TIMEOUT ST (SAFETY) ×3 IMPLANT
COVER BACK TABLE 60X90IN (DRAPES) ×3 IMPLANT
DILATOR CANAL MILEX (MISCELLANEOUS) IMPLANT
DRAPE SHEET LG 3/4 BI-LAMINATE (DRAPES) ×3 IMPLANT
DRSG TELFA 3X8 NADH (GAUZE/BANDAGES/DRESSINGS) ×6 IMPLANT
ELECT BALL LEEP 3MM BLK (ELECTRODE) IMPLANT
ELECT BALL LEEP 5MM RED (ELECTRODE) IMPLANT
ELECT LOOP LEEP RND 10X10 YLW (CUTTING LOOP)
ELECT LOOP LEEP RND 15X12 GRN (CUTTING LOOP)
ELECT LOOP LEEP RND 20X12 WHT (CUTTING LOOP)
ELECT REM PT RETURN 9FT ADLT (ELECTROSURGICAL) ×3
ELECTRODE LOOP LP RND 10X10YLW (CUTTING LOOP) IMPLANT
ELECTRODE LOOP LP RND 15X12GRN (CUTTING LOOP) IMPLANT
ELECTRODE LOOP LP RND 20X12WHT (CUTTING LOOP) IMPLANT
ELECTRODE REM PT RTRN 9FT ADLT (ELECTROSURGICAL) ×1 IMPLANT
GLOVE BIO SURGEON STRL SZ7.5 (GLOVE) ×6 IMPLANT
GOWN SPEC L3 MED W/TWL (GOWN DISPOSABLE) ×3 IMPLANT
GOWN STRL REUS W/ TWL XL LVL3 (GOWN DISPOSABLE) ×1 IMPLANT
GOWN STRL REUS W/TWL XL LVL3 (GOWN DISPOSABLE) ×3
JUMPSUIT BLUE BOOT COVER DISP (PROTECTIVE WEAR) ×3 IMPLANT
KIT PROCEDURE FLUENT (KITS) ×3 IMPLANT
KIT TURNOVER CYSTO (KITS) ×3 IMPLANT
LEGGING LITHOTOMY PAIR STRL (DRAPES) ×3 IMPLANT
LOOP CUTTING BIPOLAR 21FR (ELECTRODE) IMPLANT
NDL SAFETY ECLIPSE 18X1.5 (NEEDLE) IMPLANT
NDL SPNL 22GX3.5 QUINCKE BK (NEEDLE) ×1 IMPLANT
NEEDLE HYPO 18GX1.5 SHARP (NEEDLE)
NEEDLE SPNL 22GX3.5 QUINCKE BK (NEEDLE) ×3 IMPLANT
PACK BASIN DAY SURGERY FS (CUSTOM PROCEDURE TRAY) ×3 IMPLANT
PACK VAGINAL WOMENS (CUSTOM PROCEDURE TRAY) ×3 IMPLANT
PAD DRESSING TELFA 3X8 NADH (GAUZE/BANDAGES/DRESSINGS) ×2 IMPLANT
PAD OB MATERNITY 4.3X12.25 (PERSONAL CARE ITEMS) ×3 IMPLANT
PAD PREP 24X48 CUFFED NSTRL (MISCELLANEOUS) ×3 IMPLANT
SCOPETTES 8  STERILE (MISCELLANEOUS) ×2
SCOPETTES 8 STERILE (MISCELLANEOUS) ×1 IMPLANT
SUT VIC AB 0 CT1 36 (SUTURE) ×3 IMPLANT
SYR CONTROL 10ML LL (SYRINGE) ×3 IMPLANT
SYRINGE LUER LOK 1CC (MISCELLANEOUS) IMPLANT
TOWEL OR 17X24 6PK STRL BLUE (TOWEL DISPOSABLE) ×6 IMPLANT
TRAY DSU PREP LF (CUSTOM PROCEDURE TRAY) ×3 IMPLANT
TUBE CONNECTING 12'X1/4 (SUCTIONS)
TUBE CONNECTING 12X1/4 (SUCTIONS) IMPLANT
VACUUM HOSE/TUBING 7/8INX6FT (MISCELLANEOUS) ×3 IMPLANT
WATER STERILE IRR 500ML POUR (IV SOLUTION) ×3 IMPLANT

## 2018-09-11 NOTE — Anesthesia Procedure Notes (Signed)
Procedure Name: LMA Insertion Date/Time: 09/11/2018 10:30 AM Performed by: Bonney Aid, CRNA Pre-anesthesia Checklist: Patient identified, Emergency Drugs available, Suction available and Patient being monitored Patient Re-evaluated:Patient Re-evaluated prior to induction Oxygen Delivery Method: Circle system utilized Preoxygenation: Pre-oxygenation with 100% oxygen Induction Type: IV induction Ventilation: Mask ventilation without difficulty LMA: LMA inserted LMA Size: 4.0 Number of attempts: 1 Airway Equipment and Method: Bite block Placement Confirmation: positive ETCO2 Tube secured with: Tape Dental Injury: Teeth and Oropharynx as per pre-operative assessment

## 2018-09-11 NOTE — Discharge Instructions (Signed)
°  Post Anesthesia Home Care Instructions  Activity: Get plenty of rest for the remainder of the day. A responsible adult should stay with you for 24 hours following the procedure.  For the next 24 hours, DO NOT: -Drive a car -Paediatric nurse -Drink alcoholic beverages -Take any medication unless instructed by your physician -Make any legal decisions or sign important papers.  Meals: Start with liquid foods such as gelatin or soup. Progress to regular foods as tolerated. Avoid greasy, spicy, heavy foods. If nausea and/or vomiting occur, drink only clear liquids until the nausea and/or vomiting subsides. Call your physician if vomiting continues.  Special Instructions/Symptoms: Your throat may feel dry or sore from the anesthesia or the breathing tube placed in your throat during surgery. If this causes discomfort, gargle with warm salt water. The discomfort should disappear within 24 hours.  If you had a scopolamine patch placed behind your ear for the management of post- operative nausea and/or vomiting:  1. The medication in the patch is effective for 72 hours, after which it should be removed.  Wrap patch in a tissue and discard in the trash. Wash hands thoroughly with soap and water. 2. You may remove the patch earlier than 72 hours if you experience unpleasant side effects which may include dry mouth, dizziness or visual disturbances. 3. Avoid touching the patch. Wash your hands with soap and water after contact with the patch.   NO ADVIL, ALEVE, MOTRIN, IBUPROFEN UNTIL 4PM TODAY  Postoperative Instructions Hysteroscopy D & C  Dr. Phineas Real and the nursing staff have discussed postoperative instructions with you.  If you have any questions please ask them before you leave the hospital, or call Dr Elisabeth Most office at [phone].    We would like to emphasize the following instructions:   ? Call the office to make your follow-up appointment as recommended by Dr Phineas Real (usually  1-2 weeks).  ? You may eat a regular diet, but slowly until you start having bowel movements.  ? Drink plenty of water daily.  ? Nothing in the vagina (intercourse, douching, objects of any kind) for two weeks.  When reinitiating intercourse, if it is uncomfortable, stop and make an appointment with Dr Phineas Real to be evaluated.  ? No driving for one to two days until the effects of anesthesia has worn off.  No traveling out of town for several days.  ? You may shower, but no baths for one week.  Walking up and down stairs is ok.  No heavy lifting, prolonged standing, repeated bending or any working out until your post op check.  ? Rest frequently, listen to your body and do not push yourself and overdo it.  ? Call if:  o Your pain medication does not seem strong enough. o Worsening pain or abdominal bloating o Persistent nausea or vomiting o Difficulty with urination or bowel movements. o Temperature of 101 degrees or higher. o Heavy vaginal bleeding.  If your period is due, you may use tampons. o You have any questions or concerns

## 2018-09-11 NOTE — Op Note (Signed)
[name] Apr 20, 1941 [id number]   Post Operative Note   Date of surgery:  09/11/2018  Pre Op Dx: Postmenopausal bleeding, cervical lesion  Post Op Dx: Postmenopausal bleeding, cervical lesion  Procedure: Hysteroscopy D&C, excision cervical lesion  Surgeon:  Anastasio Auerbach  Anesthesia:  General  EBL: 5 cc  Distended media discrepancy: 75 cc saline machine reported  Complications:  None  Specimen: #1 endometrial curetting #2 cervical lesion to pathology  Findings: EUA: External BUS vagina with atrophic changes.  Cervix with fleshy bulbous lesion extruding from the external office.  Uterus grossly normal size midline mobile.  Adnexa without masses   Hysteroscopy: Adequate noting fundus, anterior/posterior endometrial surfaces, right and left tubal ostia, lower uterine segment, endocervical canal all visualized.  Endometrial surface with atrophic pattern and no lesions seen.  Cervical lesion originating from the endocervical canal.  Procedure:  The patient was taken to the operating room, was placed in the low dorsal lithotomy position, underwent general anesthesia and received a perineal/vaginal preparation with Betadine solution.  Patient had voided immediately before coming to the operating room and a catheterization was not performed. The timeout was performed by the surgical team. An EUA was performed. The patient was draped in the usual fashion. The cervix was visualized with a speculum, anterior lip grasped with a single-tooth tenaculum and a paracervical block was placed using 10 cc's of 1% lidocaine solution. The cervix was gently dilated to admit the Myosure hysteroscope and hysteroscopy was performed with findings noted above. A gentle sharp curettage was performed.  Attention was then turned to the cervical lesion which was excised at it's base using a ring forcep in a twisting fashion noting the specimen remained intact.  Scant bleeding was noted at the base which was  addressed with electrocautery to achieve ultimate hemostasis.  The instruments were removed and adequate hemostasis was visualized at the tenaculum site and external cervical os.  The specimens were identified for pathology.  The sponge, needle and instrument count were verified correct.  The patient was given intraoperative Toradol, was awakened without difficulty and was taken to the recovery room in good condition having tolerated the procedure well.   Anastasio Auerbach MD, 11:21 AM 09/11/2018

## 2018-09-11 NOTE — Anesthesia Preprocedure Evaluation (Signed)
Anesthesia Evaluation  Patient identified by MRN, date of birth, ID band Patient awake    Reviewed: Allergy & Precautions, NPO status , Patient's Chart, lab work & pertinent test results  History of Anesthesia Complications Negative for: history of anesthetic complications  Airway Mallampati: III  TM Distance: >3 FB Neck ROM: Full    Dental  (+) Teeth Intact   Pulmonary neg pulmonary ROS,    Pulmonary exam normal        Cardiovascular Normal cardiovascular exam+ dysrhythmias (s/p ablation) Atrial Fibrillation      Neuro/Psych negative neurological ROS  negative psych ROS   GI/Hepatic negative GI ROS, Neg liver ROS,   Endo/Other  Hypothyroidism   Renal/GU negative Renal ROS  negative genitourinary   Musculoskeletal  (+) Arthritis ,   Abdominal   Peds  Hematology  (+) anemia ,   Anesthesia Other Findings   Reproductive/Obstetrics                            Anesthesia Physical Anesthesia Plan  ASA: III  Anesthesia Plan: General   Post-op Pain Management:    Induction: Intravenous  PONV Risk Score and Plan: 4 or greater and Ondansetron, Dexamethasone and Treatment may vary due to age or medical condition  Airway Management Planned: LMA  Additional Equipment:   Intra-op Plan:   Post-operative Plan: Extubation in OR  Informed Consent: I have reviewed the patients History and Physical, chart, labs and discussed the procedure including the risks, benefits and alternatives for the proposed anesthesia with the patient or authorized representative who has indicated his/her understanding and acceptance.     Plan Discussed with:   Anesthesia Plan Comments:         Anesthesia Quick Evaluation

## 2018-09-11 NOTE — H&P (Signed)
The patient was examined.  I reviewed the proposed surgery and consent form with the patient.  The dictated history and physical is current and accurate and all questions were answered. The patient is ready to proceed with surgery and has a realistic understanding and expectation for the outcome.   Anastasio Auerbach MD, 8:52 AM 09/11/2018

## 2018-09-11 NOTE — Transfer of Care (Signed)
Immediate Anesthesia Transfer of Care Note  Patient: [name]  Procedure(s) Performed: DILATATION AND CURETTAGE /HYSTEROSCOPY (N/A Uterus) LOOP ELECTROSURGICAL EXCISION PROCEDURE (LEEP) (N/A Uterus)  Patient Location: PACU  Anesthesia Type:General  Level of Consciousness: awake, alert  and oriented  Airway & Oxygen Therapy: Patient Spontanous Breathing and Patient connected to nasal cannula oxygen  Post-op Assessment: Report given to RN  Post vital signs: Reviewed and stable  Last Vitals:  Vitals Value Taken Time  BP 130/67 09/11/2018 10:57 AM  Temp 36.4 C 09/11/2018 10:58 AM  Pulse 58 09/11/2018 10:58 AM  Resp    SpO2 100 % 09/11/2018 10:58 AM  Vitals shown include unvalidated device data.  Last Pain:  Vitals:   09/11/18 0831  TempSrc:   PainSc: 0-No pain      Patients Stated Pain Goal: 5 (59/16/38 4665)  Complications: No apparent anesthesia complications

## 2018-09-12 ENCOUNTER — Encounter (HOSPITAL_BASED_OUTPATIENT_CLINIC_OR_DEPARTMENT_OTHER): Payer: Self-pay | Admitting: Gynecology

## 2018-09-12 ENCOUNTER — Other Ambulatory Visit (HOSPITAL_COMMUNITY): Payer: Self-pay | Admitting: Internal Medicine

## 2018-09-12 NOTE — Anesthesia Postprocedure Evaluation (Signed)
Anesthesia Post Note  Patient: [name]  Procedure(s) Performed: DILATATION AND CURETTAGE /HYSTEROSCOPY (N/A Uterus) LOOP ELECTROSURGICAL EXCISION PROCEDURE (LEEP) (N/A Uterus)     Patient location during evaluation: PACU Anesthesia Type: General Level of consciousness: awake and alert Pain management: pain level controlled Vital Signs Assessment: post-procedure vital signs reviewed and stable Respiratory status: spontaneous breathing, nonlabored ventilation, respiratory function stable and patient connected to nasal cannula oxygen Cardiovascular status: blood pressure returned to baseline and stable Postop Assessment: no apparent nausea or vomiting Anesthetic complications: no    Last Vitals:  Vitals:   09/11/18 1058 09/11/18 1230  BP: 130/67 (!) 106/51  Pulse: (!) 58 (!) 50  Resp: 15 16  Temp: 36.4 C 36.5 C  SpO2: 100% 99%    Last Pain:  Vitals:   09/12/18 0902  TempSrc:   PainSc: 0-No pain                 Lidia Collum

## 2018-09-25 ENCOUNTER — Ambulatory Visit (INDEPENDENT_AMBULATORY_CARE_PROVIDER_SITE_OTHER): Payer: Medicare Other | Admitting: Gynecology

## 2018-09-25 ENCOUNTER — Encounter: Payer: Self-pay | Admitting: Gynecology

## 2018-09-25 VITALS — BP 122/78

## 2018-09-25 DIAGNOSIS — Z9889 Other specified postprocedural states: Secondary | ICD-10-CM

## 2018-09-25 NOTE — Patient Instructions (Signed)
Follow-up in 1 year for examination.

## 2018-09-25 NOTE — Progress Notes (Signed)
    [name] 12-03-1941 [id number]        77 y.o.  G4P4 presents for postop checks.  Status post hysteroscopy D&C with resection of endocervical polyp.  Is done well without complaints  Past medical history,surgical history, problem list, medications, allergies, family history and social history were all reviewed and documented in the EPIC chart.  Directed ROS with pertinent positives and negatives documented in the history of present illness/assessment and plan.  Exam: Caryn Bee assistant Vitals:   09/25/18 1156  BP: 122/78   General appearance:  Normal Pelvic external BUS vagina with atrophic changes.  Cervix grossly normal.  Uterus grossly normal midline mobile nontender.  Adnexa without masses or tenderness.  Assessment/Plan:  77 y.o. G4P4 with normal postoperative check status post hysteroscopy D&C with resection of the endocervical polyp.  Reviewed her benign pathology and pictures from the surgery.  I asked her to follow-up in 1 year for reexamination just to make sure she does not have recurrence of any endocervical polyps.    Anastasio Auerbach MD, 12:55 PM 09/25/2018

## 2018-11-26 ENCOUNTER — Telehealth (HOSPITAL_COMMUNITY): Payer: Self-pay

## 2018-11-26 NOTE — Telephone Encounter (Signed)
   I personally called her.    Increased shortness of breath for the last 2 days. She reports increased shortness of breath after walking 1/2 mile.   I have asked her to come for visit tomorrow for and EKG, CBC, BMET,BNP.  Follow up tomorrow at 1:30   I am concerned she is back in A fib.    Mi Balla NP-C  4:21 PM

## 2018-11-26 NOTE — Telephone Encounter (Signed)
Pt called reporting SOB for the last 2 days. Pt reports no other symptoms. That's all the information pt shared with me. Please advise.

## 2018-11-27 ENCOUNTER — Encounter (HOSPITAL_COMMUNITY): Payer: Self-pay

## 2018-11-27 ENCOUNTER — Ambulatory Visit (HOSPITAL_COMMUNITY)
Admission: RE | Admit: 2018-11-27 | Discharge: 2018-11-27 | Disposition: A | Discharge status: Home / Self Care | Payer: Medicare Other | Source: Ambulatory Visit | Attending: Internal Medicine | Admitting: Internal Medicine

## 2018-11-27 VITALS — BP 144/82 | HR 65 | Wt 212.6 lb

## 2018-11-27 DIAGNOSIS — I5022 Chronic systolic (congestive) heart failure: Secondary | ICD-10-CM | POA: Diagnosis not present

## 2018-11-27 DIAGNOSIS — I480 Paroxysmal atrial fibrillation: Secondary | ICD-10-CM | POA: Insufficient documentation

## 2018-11-27 DIAGNOSIS — R0609 Other forms of dyspnea: Secondary | ICD-10-CM

## 2018-11-27 DIAGNOSIS — E039 Hypothyroidism, unspecified: Secondary | ICD-10-CM | POA: Diagnosis not present

## 2018-11-27 DIAGNOSIS — Z79899 Other long term (current) drug therapy: Secondary | ICD-10-CM | POA: Insufficient documentation

## 2018-11-27 DIAGNOSIS — E669 Obesity, unspecified: Secondary | ICD-10-CM | POA: Diagnosis not present

## 2018-11-27 DIAGNOSIS — R5383 Other fatigue: Secondary | ICD-10-CM | POA: Diagnosis not present

## 2018-11-27 DIAGNOSIS — E785 Hyperlipidemia, unspecified: Secondary | ICD-10-CM | POA: Insufficient documentation

## 2018-11-27 DIAGNOSIS — I4891 Unspecified atrial fibrillation: Secondary | ICD-10-CM

## 2018-11-27 DIAGNOSIS — Z7901 Long term (current) use of anticoagulants: Secondary | ICD-10-CM | POA: Diagnosis not present

## 2018-11-27 DIAGNOSIS — R29818 Other symptoms and signs involving the nervous system: Secondary | ICD-10-CM | POA: Diagnosis not present

## 2018-11-27 DIAGNOSIS — Z79890 Hormone replacement therapy (postmenopausal): Secondary | ICD-10-CM | POA: Diagnosis not present

## 2018-11-27 DIAGNOSIS — Z86718 Personal history of other venous thrombosis and embolism: Secondary | ICD-10-CM | POA: Diagnosis not present

## 2018-11-27 LAB — CBC
HCT: 34.5 % — ABNORMAL LOW (ref 36.0–46.0)
HEMOGLOBIN: 9.7 g/dL — AB (ref 12.0–15.0)
MCH: 24.6 pg — ABNORMAL LOW (ref 26.0–34.0)
MCHC: 28.1 g/dL — AB (ref 30.0–36.0)
MCV: 87.3 fL (ref 80.0–100.0)
PLATELETS: 330 10*3/uL (ref 150–400)
RBC: 3.95 MIL/uL (ref 3.87–5.11)
RDW: 14.6 % (ref 11.5–15.5)
WBC: 7 10*3/uL (ref 4.0–10.5)
nRBC: 0 % (ref 0.0–0.2)

## 2018-11-27 LAB — BASIC METABOLIC PANEL
Anion gap: 10 (ref 5–15)
BUN: 26 mg/dL — ABNORMAL HIGH (ref 8–23)
CO2: 27 mmol/L (ref 22–32)
CREATININE: 1.29 mg/dL — AB (ref 0.44–1.00)
Calcium: 9.5 mg/dL (ref 8.9–10.3)
Chloride: 105 mmol/L (ref 98–111)
GFR calc Af Amer: 46 mL/min — ABNORMAL LOW (ref >60)
GFR, EST NON AFRICAN AMERICAN: 40 mL/min — AB (ref >60)
Glucose, Bld: 87 mg/dL (ref 70–99)
POTASSIUM: 4.1 mmol/L (ref 3.5–5.1)
SODIUM: 142 mmol/L (ref 135–145)

## 2018-11-27 LAB — BRAIN NATRIURETIC PEPTIDE: B NATRIURETIC PEPTIDE 5: 58.4 pg/mL (ref 0.0–100.0)

## 2018-11-27 NOTE — Patient Instructions (Signed)
It was great to see you today! No medication changes are needed at this time.  You have been referred to [facility]- Dr Radford Pax for further evaluation and management of suspected sleep apnea.  Your physician has recommended that you have a sleep study. This test records several body functions during sleep, including: brain activity, eye movement, oxygen and carbon dioxide blood levels, heart rate and rhythm, breathing rate and rhythm, the flow of air through your mouth and nose, snoring, body muscle movements, and chest and belly movement.  Your physician recommends that you schedule a follow-up appointment in: 6 months with Dr Haroldine Laws   At the [facility], you and your health needs are our priority. As part of our continuing mission to provide you with exceptional heart care, we have created designated Provider Care Teams. These Care Teams include your primary Cardiologist (physician) and Advanced Practice Providers (APPs- Physician Assistants and Nurse Practitioners) who all work together to provide you with the care you need, when you need it.   You may see any of the following providers on your designated Care Team at your next follow up: Marland Kitchen Dr Glori Bickers . Dr Loralie Champagne . Darrick Grinder, NP . Lillia Mountain, NP . Rebecca Eaton

## 2018-11-27 NOTE — Progress Notes (Signed)
Patient ID: [name], female   DOB: February 11, 1941, 77 y.o.   MRN: [id number]    Advanced Heart Failure Clinic Note   HPI:  Ms Weger is a 77 y/o woman with h/o of obesity, PAF and AFL s/p AFL ablation.   She was admitted in 11/13 with acute bilateral PE, rapid AF and heart failure after a long plane trip. .EF 40-45%.  On 10/28/12 Unsuccessful DCCV . Loaded amio and underwent repeat DC-CV on 11/13. Maintained on Xarelto. In 11/13 had AFL.  Amio increased to 400 bid. On 12/10/12 successful DC-CV.  Amiodarone was eventually stopped.   04/22/13 ECHO EF 65%, mild LVH, normal RV size and systolic function, PA systolic pressure 35 mmHg .Had recurrent AF in 9/15. Amiodarone was restarted and she was cardioverted to NSR on 09/14/14. Seen again on Oct 16. Asymptomatic. Was back in AFL with RVR at 150. Underwent DC-CV but reverted to AFL. S/p A flutter ablation 12/15/2014. Was seen by Dr Rayann Heman in follow up last 01/08/15 and was stable at that time with no symptoms. She was in NSR at that time. Metoprolol stopped.   Admitted 10/31/16-11/02/16 with SOB, AFib. Started on flecainide.   Today she presents for an acute visit for increased shortness of breath. She called yesterday and reported weight gain and increased shortness of breath for the last 2 days. Says she noticed that she was was short of breath after walking 1/2 block. Overall feeling ok but has day time fatigue. Still walking 10,000 steps almost every day.  She reports difficulty sleeping. Denies PND/Orthopnea. No CP. Appetite ok. No fever or chills. Weight at home has gone up about 15 pounds. No bleeding issues. Taking all medications.    ROS: All systems negative except as listed in HPI, PMH and Problem List.  Past Medical History:  Diagnosis Date  . 1st degree AV block   . Atrial flutter ([place])    a. s/p CTI ablation 11/2014  . CHF (congestive heart failure) ([place]) 10/2012   after long plane ride  . Diverticulosis 2002   Dr. Fuller Plan  . DVT  (deep venous thrombosis) ([place]) 2013   "S/P a long airplane ride"  . Endometrial polyp   . Fasting hyperglycemia    A1C  within normal limits  . Heart murmur    was told she has one but has never affected her  . Hyperlipidemia   . Hypothyroidism   . LVH (left ventricular hypertrophy) 2014   Mild, Noted on ECHO  . Obese   . Pancreatitis due to common bile duct stone   . Paroxysmal atrial fibrillation ([facility])    RVR  . Personal history of colonic polyps 2002   tubular adenoma  . PMB (postmenopausal bleeding)   . Pulmonary embolism ([place]) 2013   "S/P a long airplane ride"  . Right bundle branch block (RBBB)   . Sinus bradycardia   . Tortuous colon   . Vitamin D deficiency   . Wears glasses     Current Outpatient Medications  Medication Sig Dispense Refill  . flecainide (TAMBOCOR) 100 MG tablet TAKE ONE TABLET EVERY TWELVE HOURS. 180 tablet 3  . flecainide (TAMBOCOR) 100 MG tablet TAKE ONE TABLET EVERY TWELVE HOURS. 180 tablet 0  . levothyroxine (SYNTHROID, LEVOTHROID) 50 MCG tablet Take 0.5 tablets (25 mcg total) by mouth daily before breakfast. 45 tablet 1  . Melatonin 10 MG CAPS Take 10 mg by mouth at bedtime.    . metoprolol tartrate (LOPRESSOR) 25 MG tablet  TAKE (1/2) TABLET TWICE DAILY. 90 tablet 0  . misoprostol (CYTOTEC) 100 MCG tablet Insert tab VAGINALLY hs before surgery. 1 tablet 0  . rosuvastatin (CRESTOR) 10 MG tablet Take 1 tablet (10 mg total) by mouth daily. 90 tablet 1  . XARELTO 20 MG TABS tablet TAKE 1 TABLET ONCE DAILY. 90 tablet 0   No current facility-administered medications for this encounter.    Vitals:   11/27/18 1343  BP: (!) 144/82  Pulse: 65  SpO2: 99%  Weight: 96.4 kg (212 lb 9.6 oz)    Wt Readings from Last 3 Encounters:  11/27/18 96.4 kg (212 lb 9.6 oz)  09/11/18 94.2 kg (207 lb 11.2 oz)  07/26/18 93.9 kg (207 lb)   Reds Clip 27%    PHYSICAL EXAM: General:  Well appearing. No resp difficulty HEENT: normal Neck: supple. no JVD.  Carotids 2+ bilat; no bruits. No lymphadenopathy or thryomegaly appreciated. Cor: PMI nondisplaced. Regular rate & rhythm. No rubs, gallops or murmurs. Lungs: clear Abdomen: soft, nontender, nondistended. No hepatosplenomegaly. No bruits or masses. Good bowel sounds. Extremities: no cyanosis, clubbing, rash, edema Neuro: alert & orientedx3, cranial nerves grossly intact. moves all 4 extremities w/o difficulty. Affect pleasant  EKG: SR 71 bpm 1st AVB LBBB Personally reviewed   ASSESSMENT & PLAN: 1) Paroxysmal Afib RVR:  - Has had history of recurrent AFL despite DC-CV (x 2 in 2013 and x 1 in 2015), now, as above s/p AFL ablation by Dr Rayann Heman. Long history of AF/AFL by EKGs.  Afib s/p DCCV in 09/22/16. - Recurrent AF in 11/17 now NSR on flecainide - Maintaining NSR.  -- Continue metoprolol.  - This patients CHA2DS2-VASc Score and unadjusted Ischemic Stroke Rate (% per year) is equal to 7.2 % stroke rate/year from a score of 5 Above score calculated as 1 point each if present [CHF, HTN, DM, Vascular=MI/PAD/Aortic Plaque, Age if 65-74, or Female], 2 points each if present [Age > 75, or Stroke/TIA/TE] - Continue Xarelto for anticogulation. No bleeding issues.  2) Hx of DVT and PEs - Continue Xarelto 20mg  daily.  3.) History of chronic systolic CHF: in the setting of acute PE.  - EF recovered 2014 EF 65%   - Reds Clip reading 26%. She is not volume overloaded.  - Repeat ECHO  4) F[place]uge. Check TSH, CBC today. Set up sleep study.for suspected sleep apnea.    Follow up in one year.   Amy Clegg NP-C 1:52 PM 11/27/2018   Patient seen and examined with the above-signed Advanced Practice Provider and/or Housestaff. I personally reviewed laboratory data, imaging studies and relevant notes. I independently examined the patient and formulated the important aspects of the plan. I have edited the note to reflect any of my changes or salient points. I have personally discussed the plan with the patient  and/or family.  Overall she seems to be doing fairly well. Volume status looks good on exam and by clip. She remains in NSR on flecainide. Low likelihood for recurrent DVT/Pe on Xarelto. BNP low.On labs she has had a steady decrease in her Hgb over past 6 months from 12.1 -> 9.7. Will need w/u with her PCP and GI. I called and left her a message.  Glori Bickers, MD  6:52 PM

## 2018-11-28 ENCOUNTER — Telehealth (HOSPITAL_COMMUNITY): Payer: Self-pay | Admitting: Cardiology

## 2018-11-28 NOTE — Telephone Encounter (Signed)
-----   Message from Conrad [place], NP sent at 11/28/2018  2:24 PM EST ----- [name],  Please call her and make sure she received the message. Needs to get work up for anemia.   Thanks Amy  ----- Message ----- From: Jolaine Artist, MD Sent: 11/27/2018   6:54 PM EST To: Conrad [place], NP  On labs she has had a steady decrease in her Hgb over past 6 months from 12.1 -> 9.7. Will need w/u with her PCP and GI. I called and left her a message. Can someone f/u with her in the am?

## 2018-11-29 NOTE — Telephone Encounter (Signed)
Pt did receive message from Dr Haroldine Laws to follow up on her hgb  Patient reports she will see her PCP

## 2018-12-03 ENCOUNTER — Ambulatory Visit (INDEPENDENT_AMBULATORY_CARE_PROVIDER_SITE_OTHER)
Admission: RE | Admit: 2018-12-03 | Discharge: 2018-12-03 | Disposition: A | Discharge status: Home / Self Care | Payer: Medicare Other | Source: Ambulatory Visit | Attending: Internal Medicine | Admitting: Internal Medicine

## 2018-12-03 ENCOUNTER — Encounter: Payer: Self-pay | Admitting: Internal Medicine

## 2018-12-03 ENCOUNTER — Ambulatory Visit (INDEPENDENT_AMBULATORY_CARE_PROVIDER_SITE_OTHER): Payer: Medicare Other | Admitting: Internal Medicine

## 2018-12-03 ENCOUNTER — Other Ambulatory Visit (INDEPENDENT_AMBULATORY_CARE_PROVIDER_SITE_OTHER): Payer: Medicare Other

## 2018-12-03 VITALS — BP 150/84 | HR 61 | Temp 97.7°F | Resp 16 | Ht 66.0 in | Wt 213.5 lb

## 2018-12-03 DIAGNOSIS — D539 Nutritional anemia, unspecified: Secondary | ICD-10-CM

## 2018-12-03 DIAGNOSIS — E039 Hypothyroidism, unspecified: Secondary | ICD-10-CM

## 2018-12-03 DIAGNOSIS — R0602 Shortness of breath: Secondary | ICD-10-CM | POA: Diagnosis not present

## 2018-12-03 DIAGNOSIS — R05 Cough: Secondary | ICD-10-CM

## 2018-12-03 DIAGNOSIS — R059 Cough, unspecified: Secondary | ICD-10-CM

## 2018-12-03 DIAGNOSIS — D508 Other iron deficiency anemias: Secondary | ICD-10-CM

## 2018-12-03 DIAGNOSIS — B9789 Other viral agents as the cause of diseases classified elsewhere: Secondary | ICD-10-CM

## 2018-12-03 DIAGNOSIS — J069 Acute upper respiratory infection, unspecified: Secondary | ICD-10-CM | POA: Diagnosis not present

## 2018-12-03 LAB — TSH: TSH: 2.49 u[IU]/mL (ref 0.35–4.50)

## 2018-12-03 LAB — CBC WITH DIFFERENTIAL/PLATELET
Basophils Absolute: 0.1 10*3/uL (ref 0.0–0.1)
Basophils Relative: 1.1 % (ref 0.0–3.0)
Eosinophils Absolute: 0.1 10*3/uL (ref 0.0–0.7)
Eosinophils Relative: 2.1 % (ref 0.0–5.0)
HCT: 32.3 % — ABNORMAL LOW (ref 36.0–46.0)
Hemoglobin: 10.2 g/dL — ABNORMAL LOW (ref 12.0–15.0)
LYMPHS PCT: 14.3 % (ref 12.0–46.0)
Lymphs Abs: 1 10*3/uL (ref 0.7–4.0)
MCHC: 31.7 g/dL (ref 30.0–36.0)
MCV: 80.1 fl (ref 78.0–100.0)
Monocytes Absolute: 0.5 10*3/uL (ref 0.1–1.0)
Monocytes Relative: 7.4 % (ref 3.0–12.0)
Neutro Abs: 5 10*3/uL (ref 1.4–7.7)
Neutrophils Relative %: 75.1 % (ref 43.0–77.0)
Platelets: 295 10*3/uL (ref 150.0–400.0)
RBC: 4.03 Mil/uL (ref 3.87–5.11)
RDW: 15.4 % (ref 11.5–15.5)
WBC: 6.7 10*3/uL (ref 4.0–10.5)

## 2018-12-03 LAB — IBC PANEL
Iron: 30 ug/dL — ABNORMAL LOW (ref 42–145)
SATURATION RATIOS: 6.4 % — AB (ref 20.0–50.0)
Transferrin: 334 mg/dL (ref 212.0–360.0)

## 2018-12-03 LAB — FOLATE: Folate: 17.7 ng/mL (ref >5.9)

## 2018-12-03 LAB — FERRITIN: Ferritin: 7.4 ng/mL — ABNORMAL LOW (ref 10.0–291.0)

## 2018-12-03 LAB — VITAMIN B12: Vitamin B-12: 293 pg/mL (ref 211–911)

## 2018-12-03 MED ORDER — HYDROCODONE-HOMATROPINE 5-1.5 MG/5ML PO SYRP: 5.0000 mL | ORAL_SOLUTION | Freq: Three times a day (TID) | ORAL | 0 refills | Status: AC | PRN

## 2018-12-03 NOTE — Patient Instructions (Signed)
Cough, Adult  Coughing is a reflex that clears your throat and your airways. Coughing helps to heal and protect your lungs. It is normal to cough occasionally, but a cough that happens with other symptoms or lasts a long time may be a sign of a condition that needs treatment. A cough may last only 2-3 weeks (acute), or it may last longer than 8 weeks (chronic).  What are the causes?  Coughing is commonly caused by:   Breathing in substances that irritate your lungs.   A viral or bacterial respiratory infection.   Allergies.   Asthma.   Postnasal drip.   Smoking.   Acid backing up from the stomach into the esophagus (gastroesophageal reflux).   Certain medicines.   Chronic lung problems, including COPD (or rarely, lung cancer).   Other medical conditions such as heart failure.    Follow these instructions at home:  Pay attention to any changes in your symptoms. Take these actions to help with your discomfort:   Take medicines only as told by your health care provider.  ? If you were prescribed an antibiotic medicine, take it as told by your health care provider. Do not stop taking the antibiotic even if you start to feel better.  ? Talk with your health care provider before you take a cough suppressant medicine.   Drink enough fluid to keep your urine clear or pale yellow.   If the air is dry, use a cold steam vaporizer or humidifier in your bedroom or your home to help loosen secretions.   Avoid anything that causes you to cough at work or at home.   If your cough is worse at night, try sleeping in a semi-upright position.   Avoid cigarette smoke. If you smoke, quit smoking. If you need help quitting, ask your health care provider.   Avoid caffeine.   Avoid alcohol.   Rest as needed.    Contact a health care provider if:   You have new symptoms.   You cough up pus.   Your cough does not get better after 2-3 weeks, or your cough gets worse.   You cannot control your cough with suppressant  medicines and you are losing sleep.   You develop pain that is getting worse or pain that is not controlled with pain medicines.   You have a fever.   You have unexplained weight loss.   You have night sweats.  Get help right away if:   You cough up blood.   You have difficulty breathing.   Your heartbeat is very fast.  This information is not intended to replace advice given to you by your health care provider. Make sure you discuss any questions you have with your health care provider.  Document Released: 06/09/2011 Document Revised: 05/18/2016 Document Reviewed: 02/17/2015  Elsevier Interactive Patient Education  2018 [employer]vier Inc[employer]

## 2018-12-03 NOTE — Progress Notes (Signed)
Subjective:  Patient ID: [name], female    DOB: 1941-11-04  Age: 77 y.o. MRN: [id number]  CC: Cough and Anemia   HPI [name] presents for f/up - She recently saw her cardiologist and was found to be anemic.  She is not aware of any sources of blood loss but for the last few weeks she has been feeling fatigued.  She also complains of a 4-day history of cough that is productive of scant yellow phlegm.  She denies fever, chills, shortness of breath, night sweats, chest pain, or hemoptysis.  Outpatient Medications Prior to Visit  Medication Sig Dispense Refill  . flecainide (TAMBOCOR) 100 MG tablet TAKE ONE TABLET EVERY TWELVE HOURS. 180 tablet 0  . levothyroxine (SYNTHROID, LEVOTHROID) 50 MCG tablet Take 0.5 tablets (25 mcg total) by mouth daily before breakfast. 45 tablet 1  . Melatonin 10 MG CAPS Take 10 mg by mouth at bedtime.    . metoprolol tartrate (LOPRESSOR) 25 MG tablet TAKE (1/2) TABLET TWICE DAILY. 90 tablet 0  . rosuvastatin (CRESTOR) 10 MG tablet Take 1 tablet (10 mg total) by mouth daily. 90 tablet 1  . XARELTO 20 MG TABS tablet TAKE 1 TABLET ONCE DAILY. 90 tablet 0  . flecainide (TAMBOCOR) 100 MG tablet TAKE ONE TABLET EVERY TWELVE HOURS. 180 tablet 3  . misoprostol (CYTOTEC) 100 MCG tablet Insert tab VAGINALLY hs before surgery. 1 tablet 0   No facility-administered medications prior to visit.     ROS Review of Systems  Constitutional: Positive for fatigue and unexpected weight change (wt gain). Negative for appetite change, chills, diaphoresis and fever.  HENT: Negative.  Negative for sore throat, trouble swallowing and voice change.   Respiratory: Positive for cough. Negative for chest tightness, shortness of breath, wheezing and stridor.   Cardiovascular: Negative for chest pain, palpitations and leg swelling.  Gastrointestinal: Negative for abdominal pain, anal bleeding, blood in stool, constipation, diarrhea, nausea and vomiting.  Genitourinary:  Negative.  Negative for difficulty urinating, hematuria, vaginal bleeding and vaginal discharge.  Musculoskeletal: Negative.   Skin: Negative.  Negative for pallor.  Neurological: Negative.  Negative for dizziness, weakness, light-headedness, numbness and headaches.  Hematological: Negative for adenopathy. Does not bruise/bleed easily.  Psychiatric/Behavioral: Negative.     Objective:  BP (!) 150/84 (BP Location: Left Arm, Patient Position: Sitting, Cuff Size: Large)   Pulse 61   Temp 97.7 F (36.5 C) (Oral)   Resp 16   Ht 5\' 6"  (1.676 m)   Wt 213 lb 8 oz (96.8 kg)   SpO2 97%   BMI 34.46 kg/m   BP Readings from Last 3 Encounters:  12/03/18 (!) 150/84  11/27/18 (!) 144/82  09/25/18 122/78    Wt Readings from Last 3 Encounters:  12/03/18 213 lb 8 oz (96.8 kg)  11/27/18 212 lb 9.6 oz (96.4 kg)  09/11/18 207 lb 11.2 oz (94.2 kg)    Physical Exam  Constitutional: She is oriented to person, place, and time. No distress.  HENT:  Mouth/Throat: Oropharynx is clear and moist. No oropharyngeal exudate.  Eyes: Conjunctivae are normal. No scleral icterus.  Neck: Normal range of motion. Neck supple. No JVD present. No thyromegaly present.  Cardiovascular: Normal rate, regular rhythm and normal heart sounds. Exam reveals no gallop and no friction rub.  No murmur heard. Pulmonary/Chest: Effort normal and breath sounds normal. She has no decreased breath sounds. She has no wheezes. She has no rhonchi. She has no rales.  Abdominal: Soft. Normal  appearance and bowel sounds are normal. She exhibits no mass. There is no hepatosplenomegaly. There is no tenderness.  Genitourinary: Rectum normal. Rectal exam shows no external hemorrhoid, no internal hemorrhoid, no fissure, no mass, no tenderness, anal tone normal and guaiac negative stool.  Musculoskeletal: Normal range of motion. She exhibits no edema, tenderness or deformity.  Lymphadenopathy:    She has no cervical adenopathy.  Neurological:  She is alert and oriented to person, place, and time.  Skin: Skin is warm and dry. She is not diaphoretic. No pallor.  Vitals reviewed.   Lab Results  Component Value Date   WBC 6.7 12/03/2018   HGB 10.2 (L) 12/03/2018   HCT 32.3 (L) 12/03/2018   PLT 295.0 12/03/2018   GLUCOSE 87 11/27/2018   CHOL 222 (H) 07/18/2018   TRIG 64.0 07/18/2018   HDL 65.70 07/18/2018   LDLDIRECT 127.1 07/08/2013   LDLCALC 143 (H) 07/18/2018   ALT 16 09/04/2018   AST 29 09/04/2018   NA 142 11/27/2018   K 4.1 11/27/2018   CL 105 11/27/2018   CREATININE 1.29 (H) 11/27/2018   BUN 26 (H) 11/27/2018   CO2 27 11/27/2018   TSH 2.49 12/03/2018   INR 2.79 (H) 10/09/2014   HGBA1C 5.5 03/13/2008   MICROALBUR 0.8 12/03/2006    No results found.  Assessment & Plan:   [name] was seen today for cough and anemia.  Diagnoses and all orders for this visit:  Deficiency anemia- Her H&H have improved slightly.  I do not see any sources of blood loss.  Her labs reveal mild iron deficiency anemia.  Testing for other vitamin deficiencies is thus far unremarkable. -     Cancel: CBC with Differential/Platelet; Future -     Vitamin B12; Future -     IBC panel; Future -     Reticulocytes; Future -     Folate; Future -     Ferritin; Future -     Vitamin B1; Future -     CBC with Differential/Platelet; Future  Acquired hypothyroidism- Her TSH is in the normal range.  She will remain on the current dose of levothyroxine. -     TSH; Future  Cough- Her chest x-ray is negative for mass or infiltrate. -     DG Chest 2 View; Future  Viral upper respiratory tract infection with cough -     HYDROcodone-homatropine (HYCODAN) 5-1.5 MG/5ML syrup; Take 5 mLs by mouth every 8 (eight) hours as needed for up to 7 days for cough.  Iron deficiency anemia secondary to inadequate dietary iron intake-she has symptomatic iron deficiency anemia.  She will start an oral iron supplement but I have also recommended that she receive an  iron infusion. -     ferrous sulfate 325 (65 FE) MG tablet; Take 1 tablet (325 mg total) by mouth 2 (two) times daily with a meal.   I have discontinued [name]'s misoprostol. I am also having her start on HYDROcodone-homatropine and ferrous sulfate. Additionally, I am having her maintain her Melatonin, rosuvastatin, levothyroxine, metoprolol tartrate, XARELTO, and flecainide.  Meds ordered this encounter  Medications  . HYDROcodone-homatropine (HYCODAN) 5-1.5 MG/5ML syrup    Sig: Take 5 mLs by mouth every 8 (eight) hours as needed for up to 7 days for cough.    Dispense:  120 mL    Refill:  0  . ferrous sulfate 325 (65 FE) MG tablet    Sig: Take 1 tablet (325 mg total) by mouth  2 (two) times daily with a meal.    Dispense:  60 tablet    Refill:  0     Follow-up: Return in about 1 week (around 12/10/2018).  Scarlette Calico, MD

## 2018-12-04 MED ORDER — FERROUS SULFATE 325 (65 FE) MG PO TABS: 325.0000 mg | ORAL_TABLET | Freq: Two times a day (BID) | ORAL | 0 refills | Status: DC

## 2018-12-04 NOTE — Telephone Encounter (Signed)
-----   Message from Janith Lima, MD sent at 12/04/2018  9:31 AM EST ----- Will start an iron supplement in the meantime RX sent to her pharmacy  TJ   ----- Message ----- From: Johney Frame, Adelina Mings, [place]: 12/04/2018   9:04 AM EST To: Janith Lima, MD  Her infusions are scheduled for 12.26 and 1.2 at 1pm.

## 2018-12-04 NOTE — Telephone Encounter (Signed)
Informed pt rx was sent to the pharmacy

## 2018-12-07 LAB — RETICULOCYTES
ABS RETIC: 31760 {cells}/uL (ref 20000–8000)
Retic Ct Pct: 0.8 %

## 2018-12-07 LAB — VITAMIN B1: Vitamin B1 (Thiamine): 12 nmol/L (ref 8–30)

## 2018-12-12 ENCOUNTER — Encounter (HOSPITAL_COMMUNITY): Payer: Medicare Other

## 2018-12-12 ENCOUNTER — Ambulatory Visit (HOSPITAL_COMMUNITY)
Admission: RE | Admit: 2018-12-12 | Discharge: 2018-12-12 | Disposition: A | Discharge status: Home / Self Care | Payer: Medicare Other | Source: Ambulatory Visit | Attending: Internal Medicine | Admitting: Internal Medicine

## 2018-12-12 DIAGNOSIS — Z86711 Personal history of pulmonary embolism: Secondary | ICD-10-CM | POA: Diagnosis not present

## 2018-12-12 DIAGNOSIS — I4430 Unspecified atrioventricular block: Secondary | ICD-10-CM | POA: Diagnosis not present

## 2018-12-12 DIAGNOSIS — I4891 Unspecified atrial fibrillation: Secondary | ICD-10-CM | POA: Diagnosis not present

## 2018-12-12 DIAGNOSIS — I4510 Unspecified right bundle-branch block: Secondary | ICD-10-CM | POA: Insufficient documentation

## 2018-12-12 DIAGNOSIS — I080 Rheumatic disorders of both mitral and aortic valves: Secondary | ICD-10-CM | POA: Insufficient documentation

## 2018-12-12 DIAGNOSIS — I4892 Unspecified atrial flutter: Secondary | ICD-10-CM | POA: Insufficient documentation

## 2018-12-12 DIAGNOSIS — Z86718 Personal history of other venous thrombosis and embolism: Secondary | ICD-10-CM | POA: Insufficient documentation

## 2018-12-12 DIAGNOSIS — I509 Heart failure, unspecified: Secondary | ICD-10-CM | POA: Diagnosis not present

## 2018-12-12 NOTE — Progress Notes (Signed)
  Echocardiogram 2D Echocardiogram has been performed.  [name] 12/12/2018, 2:55 PM

## 2018-12-13 ENCOUNTER — Other Ambulatory Visit (HOSPITAL_COMMUNITY): Payer: Self-pay | Admitting: Internal Medicine

## 2018-12-16 ENCOUNTER — Other Ambulatory Visit (HOSPITAL_COMMUNITY): Payer: Self-pay

## 2018-12-16 NOTE — Telephone Encounter (Signed)
error 

## 2018-12-19 ENCOUNTER — Ambulatory Visit (HOSPITAL_COMMUNITY)
Admission: RE | Admit: 2018-12-19 | Discharge: 2018-12-19 | Disposition: A | Discharge status: Home / Self Care | Payer: Medicare Other | Source: Ambulatory Visit | Attending: Internal Medicine | Admitting: Internal Medicine

## 2018-12-19 DIAGNOSIS — D508 Other iron deficiency anemias: Secondary | ICD-10-CM | POA: Diagnosis not present

## 2018-12-19 MED ORDER — SODIUM CHLORIDE 0.9 % IV SOLN
INTRAVENOUS | Status: DC | PRN
  Administered 2018-12-19: 250 mL via INTRAVENOUS

## 2018-12-19 MED ORDER — SODIUM CHLORIDE 0.9 % IV SOLN
750.0000 mg | Freq: Once | INTRAVENOUS | Status: AC
  Administered 2018-12-19: 750 mg via INTRAVENOUS
  Filled 2018-12-19: qty 15

## 2018-12-19 NOTE — Discharge Instructions (Signed)

## 2018-12-19 NOTE — Progress Notes (Signed)
PATIENT CARE CENTER NOTE  Diagnosis: Iron Deficiency Anemia    Provider: Dr. Scarlette Calico   Procedure: IV Injectafer    Note: Patient received infusion of Injectafer. Monitored patient for 30 minutes post-infusion.  Tolerated well with no adverse reaction. Vital signs stable. Discharge instructions given. Patient to come back next week for second dose of Injectafer. Alert, oriented and ambulatory at discharge.

## 2018-12-26 ENCOUNTER — Ambulatory Visit (HOSPITAL_COMMUNITY)
Admission: RE | Admit: 2018-12-26 | Discharge: 2018-12-26 | Disposition: A | Discharge status: Home / Self Care | Payer: Medicare Other | Source: Ambulatory Visit | Attending: Internal Medicine | Admitting: Internal Medicine

## 2018-12-26 ENCOUNTER — Telehealth: Payer: Self-pay

## 2018-12-26 DIAGNOSIS — D508 Other iron deficiency anemias: Secondary | ICD-10-CM | POA: Insufficient documentation

## 2018-12-26 MED ORDER — SODIUM CHLORIDE 0.9 % IV SOLN
750.0000 mg | Freq: Once | INTRAVENOUS | Status: AC
  Administered 2018-12-26: 750 mg via INTRAVENOUS
  Filled 2018-12-26: qty 15

## 2018-12-26 MED ORDER — SODIUM CHLORIDE 0.9 % IV SOLN
INTRAVENOUS | Status: DC | PRN
  Administered 2018-12-26: 250 mL via INTRAVENOUS

## 2018-12-26 NOTE — Discharge Instructions (Signed)

## 2018-12-26 NOTE — Telephone Encounter (Signed)
Pt has completed her infusions. When would you like her to follow up with you?

## 2018-12-26 NOTE — Progress Notes (Signed)
PATIENT CARE CENTER NOTE  Diagnosis: Iron Deficiency Anemia    Provider: Dr. Scarlette Calico   Procedure: IV Injectafer    Note: Patient received infusion of Injectafer. Monitored patient for 30 minutes post-infusion.  Tolerated well with no adverse reaction. Vital signs stable. Discharge instructions given.  Alert, oriented and ambulatory at discharge.

## 2018-12-26 NOTE — Telephone Encounter (Signed)
Copied from [place] [id number]. Topic: Appointment Scheduling - Scheduling Inquiry for Clinic >> Dec 19, 2018 11:31 AM Reyne Dumas L wrote: Reason for CRM:   Pt was told to follow up after iron infusions.  Last infusion is 12/26/18.  Pt states that she leaves for three months in [place] on 12/31/18.  Pt wants to be seen on 01/06 if possible. Pt can be reached at [phone] >> Dec 19, 2018  1:05 PM Morey Hummingbird wrote: Please advise on work in

## 2018-12-26 NOTE — Telephone Encounter (Signed)
I would like to recheck her anemia in 2-3 months  TJ

## 2018-12-26 NOTE — Progress Notes (Deleted)
Patient received Injectafer via PIV. Observed for at least 30 minutes post infusion.Tolerated well, vitals stable, discharge instructions given, verbalized understanding. Patient alert, oriented and ambulatory at the time of discharge.  

## 2018-12-27 NOTE — Telephone Encounter (Signed)
Pt contacted and she is coming in on Monday. Pt stated that she living in [place] during the winter and will not be able to come in until after April 7th after Monday's appt.

## 2018-12-30 ENCOUNTER — Other Ambulatory Visit (INDEPENDENT_AMBULATORY_CARE_PROVIDER_SITE_OTHER): Payer: Medicare Other

## 2018-12-30 ENCOUNTER — Other Ambulatory Visit: Payer: Self-pay | Admitting: Internal Medicine

## 2018-12-30 ENCOUNTER — Encounter: Payer: Self-pay | Admitting: Internal Medicine

## 2018-12-30 ENCOUNTER — Ambulatory Visit (INDEPENDENT_AMBULATORY_CARE_PROVIDER_SITE_OTHER): Payer: Medicare Other | Admitting: Internal Medicine

## 2018-12-30 VITALS — BP 130/80 | HR 63 | Temp 98.1°F | Ht 66.0 in | Wt 209.2 lb

## 2018-12-30 DIAGNOSIS — D508 Other iron deficiency anemias: Secondary | ICD-10-CM

## 2018-12-30 DIAGNOSIS — E7800 Pure hypercholesterolemia, unspecified: Secondary | ICD-10-CM | POA: Diagnosis not present

## 2018-12-30 LAB — CBC WITH DIFFERENTIAL/PLATELET
Basophils Absolute: 0 10*3/uL (ref 0.0–0.1)
Basophils Relative: 0.8 % (ref 0.0–3.0)
Eosinophils Absolute: 0.1 10*3/uL (ref 0.0–0.7)
Eosinophils Relative: 2.5 % (ref 0.0–5.0)
HEMATOCRIT: 38.3 % (ref 36.0–46.0)
Hemoglobin: 12.3 g/dL (ref 12.0–15.0)
LYMPHS PCT: 14.3 % (ref 12.0–46.0)
Lymphs Abs: 0.8 10*3/uL (ref 0.7–4.0)
MCHC: 32 g/dL (ref 30.0–36.0)
MCV: 83.9 fl (ref 78.0–100.0)
MONOS PCT: 7.2 % (ref 3.0–12.0)
Monocytes Absolute: 0.4 10*3/uL (ref 0.1–1.0)
Neutro Abs: 4.3 10*3/uL (ref 1.4–7.7)
Neutrophils Relative %: 75.2 % (ref 43.0–77.0)
Platelets: 277 10*3/uL (ref 150.0–400.0)
RBC: 4.56 Mil/uL (ref 3.87–5.11)
RDW: 20.9 % — ABNORMAL HIGH (ref 11.5–15.5)
WBC: 5.7 10*3/uL (ref 4.0–10.5)

## 2018-12-30 MED ORDER — ROSUVASTATIN CALCIUM 10 MG PO TABS: 10.0000 mg | ORAL_TABLET | Freq: Every day | ORAL | 1 refills | Status: DC

## 2018-12-30 NOTE — Patient Instructions (Signed)
Iron Deficiency Anemia, Adult  Iron deficiency anemia is a condition in which the concentration of red blood cells or hemoglobin in the blood is below normal because of too little iron. Hemoglobin is a substance in red blood cells that carries oxygen to the body's tissues. When the concentration of red blood cells or hemoglobin is too low, not enough oxygen reaches these tissues.  Iron deficiency anemia is usually long-lasting (chronic) and it develops over time. It may or may not cause symptoms. It is a common type of anemia.  What are the causes?  This condition may be caused by:   Not enough iron in the diet.   Blood loss caused by bleeding in the intestine.   Blood loss from a gastrointestinal condition like Crohn disease.   Frequent blood draws, such as from blood donation.   Abnormal absorption in the gut.   Heavy menstrual periods in women.   Cancers of the gastrointestinal system, such as colon cancer.  What are the signs or symptoms?  Symptoms of this condition may include:   Fatigue.   Headache.   Pale skin, lips, and nail beds.   Poor appetite.   Weakness.   Shortness of breath.   Dizziness.   Cold hands and feet.   Fast or irregular heartbeat.   Irritability. This is more common in severe anemia.   Rapid breathing. This is more common in severe anemia.  Mild anemia may not cause any symptoms.  How is this diagnosed?  This condition is diagnosed based on:   Your medical history.   A physical exam.   Blood tests.  You may have additional tests to find the underlying cause of your anemia, such as:   Testing for blood in the stool (fecal occult blood test).   A procedure to see inside your colon and rectum (colonoscopy).   A procedure to see inside your esophagus and stomach (endoscopy).   A test in which cells are removed from bone marrow (bone marrow aspiration) or fluid is removed from the bone marrow to be examined (biopsy). This is rarely needed.  How is this treated?  This  condition is treated by correcting the cause of your iron deficiency. Treatment may involve:   Adding iron-rich foods to your diet.   Taking iron supplements. If you are pregnant or breastfeeding, you may need to take extra iron because your normal diet usually does not provide the amount of iron that you need.   Increasing vitamin C intake. Vitamin C helps your body absorb iron. Your health care provider may recommend that you take iron supplements along with a glass of orange juice or a vitamin C supplement.   Medicines to make heavy menstrual flow lighter.   Surgery.  You may need repeat blood tests to determine whether treatment is working. Depending on the underlying cause, the anemia should be corrected within 2 months of starting treatment. If the treatment does not seem to be working, you may need more testing.  Follow these instructions at home:  Medicines   Take over-the-counter and prescription medicines only as told by your health care provider. This includes iron supplements and vitamins.   If you cannot tolerate taking iron supplements by mouth, talk with your health care provider about taking them through a vein (intravenously) or an injection into a muscle.   For the best iron absorption, you should take iron supplements when your stomach is empty. If you cannot tolerate them on an empty stomach,   you may need to take them with food.   Do not drink milk or take antacids at the same time as your iron supplements. Milk and antacids may interfere with iron absorption.   Iron supplements can cause constipation. To prevent constipation, include fiber in your diet as told by your health care provider. A stool softener may also be recommended.  Eating and drinking     Talk with your health care provider before changing your diet. He or she may recommend that you eat foods that contain a lot of iron, such as:  ? Liver.  ? Low-fat (lean) beef.  ? Breads and cereals that have iron added to them (are  fortified).  ? Eggs.  ? Dried fruit.  ? Dark green, leafy vegetables.   To help your body use the iron from iron-rich foods, eat those foods at the same time as fresh fruits and vegetables that are high in vitamin C. Foods that are high in vitamin C include:  ? Oranges.  ? Peppers.  ? Tomatoes.  ? Mangoes.   Drinkenoughfluid to keep your urine clear or pale yellow.  General instructions   Return to your normal activities as told by your health care provider. Ask your health care provider what activities are safe for you.   Practice good hygiene. Anemia can make you more prone to illness and infection.   Keep all follow-up visits as told by your health care provider. This is important.  Contact a health care provider if:   You feel nauseous or you vomit.   You feel weak.   You have unexplained sweating.   You develop symptoms of constipation, such as:  ? Having fewer than three bowel movements a week.  ? Straining to have a bowel movement.  ? Having stools that are hard, dry, or larger than normal.  ? Feeling full or bloated.  ? Pain in the lower abdomen.  ? Not feeling relief after having a bowel movement.  Get help right away if:   You faint. If this happens, do not drive yourself to the hospital. Call your local emergency services (911 in the [place]   You have chest pain.   You have shortness of breath that:  ? Is severe.  ? Gets worse with physical activity.   You have a rapid heartbeat.   You become light-headed when getting up from a sitting or lying down position.  This information is not intended to replace advice given to you by your health care provider. Make sure you discuss any questions you have with your health care provider.  Document Released: 12/08/2000 Document Revised: 08/30/2016 Document Reviewed: 08/30/2016  Elsevier Interactive Patient Education  2019 Elsevier Inc[employer]

## 2018-12-30 NOTE — Progress Notes (Signed)
Subjective:  Patient ID: [name], female    DOB: 05-08-1941  Age: 78 y.o. MRN: [id number]  CC: Anemia   HPI [name] presents for f/up - She has received 2 iron infusions in the last few weeks.  Her fatigue has resolved.  She feels much better.  She feels well today and offers no complaints.  Outpatient Medications Prior to Visit  Medication Sig Dispense Refill  . ferrous sulfate 325 (65 FE) MG tablet Take 1 tablet (325 mg total) by mouth 2 (two) times daily with a meal. 60 tablet 0  . flecainide (TAMBOCOR) 100 MG tablet TAKE ONE TABLET EVERY TWELVE HOURS. 180 tablet 3  . levothyroxine (SYNTHROID, LEVOTHROID) 50 MCG tablet Take 0.5 tablets (25 mcg total) by mouth daily before breakfast. 45 tablet 1  . Melatonin 10 MG CAPS Take 10 mg by mouth at bedtime.    . metoprolol tartrate (LOPRESSOR) 25 MG tablet TAKE (1/2) TABLET TWICE DAILY. 45 tablet 3  . XARELTO 20 MG TABS tablet TAKE 1 TABLET ONCE DAILY. 90 tablet 3  . rosuvastatin (CRESTOR) 10 MG tablet Take 1 tablet (10 mg total) by mouth daily. 90 tablet 1   No facility-administered medications prior to visit.     ROS Review of Systems  Constitutional: Negative for diaphoresis, fatigue and unexpected weight change.  HENT: Negative.   Eyes: Negative for visual disturbance.  Respiratory: Negative for cough, chest tightness and shortness of breath.   Cardiovascular: Negative for chest pain and leg swelling.  Gastrointestinal: Negative for abdominal pain, blood in stool, diarrhea, nausea and vomiting.  Genitourinary: Negative.  Negative for difficulty urinating and hematuria.  Musculoskeletal: Negative.  Negative for arthralgias and myalgias.  Skin: Negative.  Negative for color change and pallor.  Neurological: Negative.  Negative for dizziness, light-headedness and numbness.  Hematological: Negative for adenopathy. Does not bruise/bleed easily.  Psychiatric/Behavioral: Negative.     Objective:  BP 130/80 (BP  Location: Left Arm, Patient Position: Sitting, Cuff Size: Large)   Pulse 63   Temp 98.1 F (36.7 C) (Oral)   Ht 5\' 6"  (1.676 m)   Wt 209 lb 4 oz (94.9 kg)   SpO2 98%   BMI 33.77 kg/m   BP Readings from Last 3 Encounters:  12/30/18 130/80  12/26/18 125/77  12/19/18 116/69    Wt Readings from Last 3 Encounters:  12/30/18 209 lb 4 oz (94.9 kg)  12/03/18 213 lb 8 oz (96.8 kg)  11/27/18 212 lb 9.6 oz (96.4 kg)    Physical Exam Vitals signs reviewed.  Constitutional:      Appearance: She is obese.  HENT:     Nose: Nose normal. No congestion.     Mouth/Throat:     Pharynx: Oropharynx is clear. No oropharyngeal exudate or posterior oropharyngeal erythema.  Eyes:     General: No scleral icterus.    Conjunctiva/sclera: Conjunctivae normal.  Neck:     Musculoskeletal: Normal range of motion and neck supple. No neck rigidity.  Cardiovascular:     Rate and Rhythm: Normal rate and regular rhythm.     Heart sounds: No murmur. No gallop.   Pulmonary:     Effort: Pulmonary effort is normal.     Breath sounds: Normal breath sounds. No stridor. No wheezing, rhonchi or rales.  Abdominal:     General: Bowel sounds are normal.     Tenderness: There is no abdominal tenderness. There is no guarding.     Hernia: No hernia is present.  Musculoskeletal: Normal range of motion.        General: No swelling.     Right lower leg: No edema.     Left lower leg: No edema.  Skin:    General: Skin is warm and dry.     Coloration: Skin is not pale.  Neurological:     General: No focal deficit present.     Mental Status: She is oriented to person, place, and time. Mental status is at baseline.     Lab Results  Component Value Date   WBC 5.7 12/30/2018   HGB 12.3 12/30/2018   HCT 38.3 12/30/2018   PLT 277.0 12/30/2018   GLUCOSE 87 11/27/2018   CHOL 222 (H) 07/18/2018   TRIG 64.0 07/18/2018   HDL 65.70 07/18/2018   LDLDIRECT 127.1 07/08/2013   LDLCALC 143 (H) 07/18/2018   ALT 16  09/04/2018   AST 29 09/04/2018   NA 142 11/27/2018   K 4.1 11/27/2018   CL 105 11/27/2018   CREATININE 1.29 (H) 11/27/2018   BUN 26 (H) 11/27/2018   CO2 27 11/27/2018   TSH 2.49 12/03/2018   INR 2.79 (H) 10/09/2014   HGBA1C 5.5 03/13/2008   MICROALBUR 0.8 12/03/2006    No results found.  Assessment & Plan:   Jocelyn was seen today for anemia.  Diagnoses and all orders for this visit:  Iron deficiency anemia secondary to inadequate dietary iron intake- Her H&H are normal now. -     CBC with Differential/Platelet; Future  Pure hypercholesterolemia- She has achieved her LDL goal and is doing well on the statin. -     rosuvastatin (CRESTOR) 10 MG tablet; Take 1 tablet (10 mg total) by mouth daily.   I am having [name]. Bloodworth maintain her Melatonin, levothyroxine, ferrous sulfate, XARELTO, flecainide, metoprolol tartrate, and rosuvastatin.  Meds ordered this encounter  Medications  . rosuvastatin (CRESTOR) 10 MG tablet    Sig: Take 1 tablet (10 mg total) by mouth daily.    Dispense:  90 tablet    Refill:  1     Follow-up: Return in about 4 months (around 04/30/2019).  Scarlette Calico, MD

## 2019-03-13 ENCOUNTER — Other Ambulatory Visit: Payer: Self-pay | Admitting: Internal Medicine

## 2019-03-13 ENCOUNTER — Other Ambulatory Visit (HOSPITAL_COMMUNITY): Payer: Self-pay | Admitting: Internal Medicine

## 2019-03-13 DIAGNOSIS — E039 Hypothyroidism, unspecified: Secondary | ICD-10-CM

## 2019-05-05 DIAGNOSIS — H5213 Myopia, bilateral: Secondary | ICD-10-CM | POA: Diagnosis not present

## 2019-05-05 DIAGNOSIS — H52203 Unspecified astigmatism, bilateral: Secondary | ICD-10-CM | POA: Diagnosis not present

## 2019-05-05 DIAGNOSIS — H25813 Combined forms of age-related cataract, bilateral: Secondary | ICD-10-CM | POA: Diagnosis not present

## 2019-05-05 DIAGNOSIS — H43813 Vitreous degeneration, bilateral: Secondary | ICD-10-CM | POA: Diagnosis not present

## 2019-05-09 ENCOUNTER — Other Ambulatory Visit: Payer: Self-pay | Admitting: Internal Medicine

## 2019-05-09 DIAGNOSIS — Z1231 Encounter for screening mammogram for malignant neoplasm of breast: Secondary | ICD-10-CM

## 2019-06-03 ENCOUNTER — Telehealth (HOSPITAL_COMMUNITY): Payer: Self-pay | Admitting: *Deleted

## 2019-06-03 NOTE — Telephone Encounter (Signed)
Pt left VM on triage line stating she is sch for Cataract surgery on 6/16 and needs to hold [place].  She wants to make sure this is ok with Dr Haroldine Laws and how long she can hold it.  Will send to him for review.

## 2019-06-03 NOTE — Telephone Encounter (Signed)
Hold for 2 days prior to surgery and resume the night after if ok with eye doctor.

## 2019-06-04 NOTE — Telephone Encounter (Signed)
Spoke w/pt, she is aware, agreeable and verbalizes understanding. 

## 2019-06-06 ENCOUNTER — Other Ambulatory Visit (HOSPITAL_COMMUNITY): Payer: Self-pay | Admitting: Internal Medicine

## 2019-06-06 ENCOUNTER — Other Ambulatory Visit: Payer: Self-pay | Admitting: Internal Medicine

## 2019-06-06 DIAGNOSIS — E039 Hypothyroidism, unspecified: Secondary | ICD-10-CM

## 2019-06-06 DIAGNOSIS — E7800 Pure hypercholesterolemia, unspecified: Secondary | ICD-10-CM

## 2019-06-10 DIAGNOSIS — H21562 Pupillary abnormality, left eye: Secondary | ICD-10-CM | POA: Diagnosis not present

## 2019-06-10 DIAGNOSIS — H268 Other specified cataract: Secondary | ICD-10-CM | POA: Diagnosis not present

## 2019-06-10 DIAGNOSIS — H25812 Combined forms of age-related cataract, left eye: Secondary | ICD-10-CM | POA: Diagnosis not present

## 2019-07-02 ENCOUNTER — Ambulatory Visit
Admission: RE | Admit: 2019-07-02 | Discharge: 2019-07-02 | Disposition: A | Discharge status: Home / Self Care | Payer: Medicare Other | Source: Ambulatory Visit | Attending: Internal Medicine | Admitting: Internal Medicine

## 2019-07-02 ENCOUNTER — Other Ambulatory Visit: Payer: Self-pay

## 2019-07-02 DIAGNOSIS — Z1231 Encounter for screening mammogram for malignant neoplasm of breast: Secondary | ICD-10-CM

## 2019-07-02 LAB — HM MAMMOGRAPHY

## 2019-07-03 ENCOUNTER — Other Ambulatory Visit: Payer: Self-pay

## 2019-07-03 ENCOUNTER — Other Ambulatory Visit (INDEPENDENT_AMBULATORY_CARE_PROVIDER_SITE_OTHER): Payer: Medicare Other

## 2019-07-03 ENCOUNTER — Encounter: Payer: Self-pay | Admitting: Internal Medicine

## 2019-07-03 ENCOUNTER — Ambulatory Visit (INDEPENDENT_AMBULATORY_CARE_PROVIDER_SITE_OTHER): Payer: Medicare Other | Admitting: Internal Medicine

## 2019-07-03 VITALS — BP 140/80 | HR 58 | Temp 98.0°F | Ht 66.0 in | Wt 223.8 lb

## 2019-07-03 DIAGNOSIS — D508 Other iron deficiency anemias: Secondary | ICD-10-CM

## 2019-07-03 DIAGNOSIS — Z7901 Long term (current) use of anticoagulants: Secondary | ICD-10-CM

## 2019-07-03 DIAGNOSIS — Z5181 Encounter for therapeutic drug level monitoring: Secondary | ICD-10-CM | POA: Diagnosis not present

## 2019-07-03 DIAGNOSIS — E785 Hyperlipidemia, unspecified: Secondary | ICD-10-CM | POA: Diagnosis not present

## 2019-07-03 DIAGNOSIS — I4892 Unspecified atrial flutter: Secondary | ICD-10-CM

## 2019-07-03 DIAGNOSIS — E039 Hypothyroidism, unspecified: Secondary | ICD-10-CM

## 2019-07-03 DIAGNOSIS — Z23 Encounter for immunization: Secondary | ICD-10-CM | POA: Diagnosis not present

## 2019-07-03 DIAGNOSIS — E559 Vitamin D deficiency, unspecified: Secondary | ICD-10-CM

## 2019-07-03 DIAGNOSIS — E7800 Pure hypercholesterolemia, unspecified: Secondary | ICD-10-CM

## 2019-07-03 DIAGNOSIS — I4891 Unspecified atrial fibrillation: Secondary | ICD-10-CM

## 2019-07-03 LAB — CBC WITH DIFFERENTIAL/PLATELET
Basophils Absolute: 0 10*3/uL (ref 0.0–0.1)
Basophils Relative: 0.7 % (ref 0.0–3.0)
Eosinophils Absolute: 0.2 10*3/uL (ref 0.0–0.7)
Eosinophils Relative: 2.6 % (ref 0.0–5.0)
HCT: 39.8 % (ref 36.0–46.0)
Hemoglobin: 13.2 g/dL (ref 12.0–15.0)
Lymphocytes Relative: 14.5 % (ref 12.0–46.0)
Lymphs Abs: 0.9 10*3/uL (ref 0.7–4.0)
MCHC: 33 g/dL (ref 30.0–36.0)
MCV: 95.2 fl (ref 78.0–100.0)
Monocytes Absolute: 0.4 10*3/uL (ref 0.1–1.0)
Monocytes Relative: 6 % (ref 3.0–12.0)
Neutro Abs: 4.5 10*3/uL (ref 1.4–7.7)
Neutrophils Relative %: 76.2 % (ref 43.0–77.0)
Platelets: 251 10*3/uL (ref 150.0–400.0)
RBC: 4.18 Mil/uL (ref 3.87–5.11)
RDW: 13.4 % (ref 11.5–15.5)
WBC: 6 10*3/uL (ref 4.0–10.5)

## 2019-07-03 LAB — BASIC METABOLIC PANEL
BUN: 26 mg/dL — ABNORMAL HIGH (ref 6–23)
CO2: 29 mEq/L (ref 19–32)
Calcium: 9.1 mg/dL (ref 8.4–10.5)
Chloride: 105 mEq/L (ref 96–112)
Creatinine, Ser: 0.91 mg/dL (ref 0.40–1.20)
GFR: 59.62 mL/min — ABNORMAL LOW (ref >60.00)
Glucose, Bld: 91 mg/dL (ref 70–99)
Potassium: 4.6 mEq/L (ref 3.5–5.1)
Sodium: 140 mEq/L (ref 135–145)

## 2019-07-03 LAB — TSH: TSH: 3.35 u[IU]/mL (ref 0.35–4.50)

## 2019-07-03 LAB — VITAMIN D 25 HYDROXY (VIT D DEFICIENCY, FRACTURES): VITD: 27.39 ng/mL — ABNORMAL LOW (ref 30.00–100.00)

## 2019-07-03 LAB — LIPID PANEL
Cholesterol: 163 mg/dL (ref 0–200)
HDL: 77.1 mg/dL (ref >39.00)
LDL Cholesterol: 76 mg/dL (ref 0–99)
NonHDL: 85.55
Total CHOL/HDL Ratio: 2
Triglycerides: 49 mg/dL (ref 0.0–149.0)
VLDL: 9.8 mg/dL (ref 0.0–40.0)

## 2019-07-03 LAB — PROTIME-INR
INR: 1.4 ratio — ABNORMAL HIGH (ref 0.8–1.0)
Prothrombin Time: 16.2 s — ABNORMAL HIGH (ref 9.6–13.1)

## 2019-07-03 LAB — FERRITIN: Ferritin: 154 ng/mL (ref 10.0–291.0)

## 2019-07-03 LAB — IBC PANEL
Iron: 112 ug/dL (ref 42–145)
Saturation Ratios: 35.2 % (ref 20.0–50.0)
Transferrin: 227 mg/dL (ref 212.0–360.0)

## 2019-07-03 MED ORDER — CHOLECALCIFEROL 50 MCG (2000 UT) PO TABS: 2.0000 | ORAL_TABLET | Freq: Every day | ORAL | 1 refills | Status: DC

## 2019-07-03 MED ORDER — ROSUVASTATIN CALCIUM 10 MG PO TABS: 10.0000 mg | ORAL_TABLET | Freq: Every day | ORAL | 1 refills | Status: DC

## 2019-07-03 MED ORDER — LEVOTHYROXINE SODIUM 50 MCG PO TABS: 25.0000 ug | ORAL_TABLET | Freq: Every day | ORAL | 1 refills | Status: DC

## 2019-07-03 NOTE — Patient Instructions (Signed)
Hypothyroidism  Hypothyroidism is when the thyroid gland does not make enough of certain hormones (it is underactive). The thyroid gland is a small gland located in the lower front part of the neck, just in front of the windpipe (trachea). This gland makes hormones that help control how the body uses food for energy (metabolism) as well as how the heart and brain function. These hormones also play a role in keeping your bones strong. When the thyroid is underactive, it produces too little of the hormones thyroxine (T4) and triiodothyronine (T3). What are the causes? This condition may be caused by:  Hashimoto's disease. This is a disease in which the body's disease-fighting system (immune system) attacks the thyroid gland. This is the most common cause.  Viral infections.  Pregnancy.  Certain medicines.  Birth defects.  Past radiation treatments to the head or neck for cancer.  Past treatment with radioactive iodine.  Past exposure to radiation in the environment.  Past surgical removal of part or all of the thyroid.  Problems with a gland in the center of the brain (pituitary gland).  Lack of enough iodine in the diet. What increases the risk? You are more likely to develop this condition if:  You are female.  You have a family history of thyroid conditions.  You use a medicine called lithium.  You take medicines that affect the immune system (immunosuppressants). What are the signs or symptoms? Symptoms of this condition include:  Feeling as though you have no energy (lethargy).  Not being able to tolerate cold.  Weight gain that is not explained by a change in diet or exercise habits.  Lack of appetite.  Dry skin.  Coarse hair.  Menstrual irregularity.  Slowing of thought processes.  Constipation.  Sadness or depression. How is this diagnosed? This condition may be diagnosed based on:  Your symptoms, your medical history, and a physical exam.  Blood  tests. You may also have imaging tests, such as an ultrasound or MRI. How is this treated? This condition is treated with medicine that replaces the thyroid hormones that your body does not make. After you begin treatment, it may take several weeks for symptoms to go away. Follow these instructions at home:  Take over-the-counter and prescription medicines only as told by your health care provider.  If you start taking any new medicines, tell your health care provider.  Keep all follow-up visits as told by your health care provider. This is important. ? As your condition improves, your dosage of thyroid hormone medicine may change. ? You will need to have blood tests regularly so that your health care provider can monitor your condition. Contact a health care provider if:  Your symptoms do not get better with treatment.  You are taking thyroid replacement medicine and you: ? Sweat a lot. ? Have tremors. ? Feel anxious. ? Lose weight rapidly. ? Cannot tolerate heat. ? Have emotional swings. ? Have diarrhea. ? Feel weak. Get help right away if you have:  Chest pain.  An irregular heartbeat.  A rapid heartbeat.  Difficulty breathing. Summary  Hypothyroidism is when the thyroid gland does not make enough of certain hormones (it is underactive).  When the thyroid is underactive, it produces too little of the hormones thyroxine (T4) and triiodothyronine (T3).  The most common cause is Hashimoto's disease, a disease in which the body's disease-fighting system (immune system) attacks the thyroid gland. The condition can also be caused by viral infections, medicine, pregnancy, or past   radiation treatment to the head or neck.  Symptoms may include weight gain, dry skin, constipation, feeling as though you do not have energy, and not being able to tolerate cold.  This condition is treated with medicine to replace the thyroid hormones that your body does not make. This information  is not intended to replace advice given to you by your health care provider. Make sure you discuss any questions you have with your health care provider. Document Released: 12/11/2005 Document Revised: 11/23/2017 Document Reviewed: 11/21/2017 Elsevier Patient Education  2020 Elsevier Inc.  

## 2019-07-03 NOTE — Progress Notes (Signed)
Subjective:  Patient ID: [name], female    DOB: [birth year]  Age: 78 y.o. MRN: [id number]  CC: Hypothyroidism, Hyperlipidemia, and Atrial Fibrillation   HPI [name] presents for f/up - She complains of weight gain and tells me she has not recently been working on her lifestyle modifications.  She denies any recent episodes of palpitations, edema, CP, DOE, dizziness, or lightheadedness.  She underwent an iron infusion earlier this year and says she has felt much better since then.  She continues to can take the iron supplement.  Outpatient Medications Prior to Visit  Medication Sig Dispense Refill  . ferrous sulfate 325 (65 FE) MG tablet Take 1 tablet (325 mg total) by mouth 2 (two) times daily with a meal. 60 tablet 0  . flecainide (TAMBOCOR) 100 MG tablet TAKE ONE TABLET EVERY TWELVE HOURS. 180 tablet 3  . metoprolol tartrate (LOPRESSOR) 25 MG tablet TAKE (1/2) TABLET TWICE DAILY. 90 tablet 0  . XARELTO 20 MG TABS tablet TAKE 1 TABLET ONCE DAILY. 90 tablet 3  . levothyroxine (SYNTHROID) 50 MCG tablet Take 0.5 tablets (25 mcg total) by mouth daily before breakfast. Follow=up appt is due must see provider for future refills 15 tablet 0  . Melatonin 10 MG CAPS Take 10 mg by mouth at bedtime.    . rosuvastatin (CRESTOR) 10 MG tablet Take 1 tablet (10 mg total) by mouth daily. Follow=up appt is due must see provider for future refills 30 tablet 0   No facility-administered medications prior to visit.     ROS Review of Systems  Constitutional: Positive for unexpected weight change (wt gain). Negative for diaphoresis and fatigue.  HENT: Negative.   Eyes: Negative for visual disturbance.  Respiratory: Negative for cough, chest tightness, shortness of breath and wheezing.   Cardiovascular: Negative for chest pain, palpitations and leg swelling.  Gastrointestinal: Negative for abdominal pain, constipation, diarrhea, nausea and vomiting.  Endocrine: Negative for cold  intolerance and heat intolerance.  Genitourinary: Negative.  Negative for difficulty urinating.  Musculoskeletal: Negative.  Negative for arthralgias and myalgias.  Skin: Negative.  Negative for color change and pallor.  Hematological: Negative for adenopathy. Does not bruise/bleed easily.  Psychiatric/Behavioral: Negative.     Objective:  BP 140/80 (BP Location: Left Arm, Patient Position: Sitting, Cuff Size: Large)   Pulse (!) 58   Temp 98 F (36.7 C) (Oral)   Ht 5\' 6"  (1.676 m)   Wt 223 lb 12 oz (101.5 kg)   SpO2 97%   BMI 36.11 kg/m   BP Readings from Last 3 Encounters:  07/03/19 140/80  12/30/18 130/80  12/26/18 125/77    Wt Readings from Last 3 Encounters:  07/03/19 223 lb 12 oz (101.5 kg)  12/30/18 209 lb 4 oz (94.9 kg)  12/03/18 213 lb 8 oz (96.8 kg)    Physical Exam Constitutional:      Appearance: She is obese. She is not ill-appearing or diaphoretic.  HENT:     Nose: Nose normal.     Mouth/Throat:     Mouth: Mucous membranes are moist.  Eyes:     General: No scleral icterus.    Conjunctiva/sclera: Conjunctivae normal.  Neck:     Musculoskeletal: Normal range of motion.  Cardiovascular:     Rate and Rhythm: Normal rate and regular rhythm.     Heart sounds: No murmur. No gallop.   Pulmonary:     Effort: Pulmonary effort is normal.     Breath sounds: No stridor.  No wheezing, rhonchi or rales.  Abdominal:     General: Abdomen is protuberant. Bowel sounds are normal. There is no distension.     Palpations: There is no hepatomegaly or splenomegaly.     Tenderness: There is no abdominal tenderness.     Hernia: No hernia is present.  Musculoskeletal: Normal range of motion.     Right lower leg: No edema.     Left lower leg: No edema.  Lymphadenopathy:     Cervical: No cervical adenopathy.  Skin:    General: Skin is warm and dry.  Neurological:     General: No focal deficit present.     Mental Status: She is alert.  Psychiatric:        Mood and  Affect: Mood normal.        Behavior: Behavior normal.     Lab Results  Component Value Date   WBC 6.0 07/03/2019   HGB 13.2 07/03/2019   HCT 39.8 07/03/2019   PLT 251.0 07/03/2019   GLUCOSE 91 07/03/2019   CHOL 163 07/03/2019   TRIG 49.0 07/03/2019   HDL 77.10 07/03/2019   LDLDIRECT 127.1 07/08/2013   LDLCALC 76 07/03/2019   ALT 16 09/04/2018   AST 29 09/04/2018   NA 140 07/03/2019   K 4.6 07/03/2019   CL 105 07/03/2019   CREATININE 0.91 07/03/2019   BUN 26 (H) 07/03/2019   CO2 29 07/03/2019   TSH 3.35 07/03/2019   INR 1.4 (H) 07/03/2019   HGBA1C 5.5 03/13/2008   MICROALBUR 0.8 12/03/2006    Mm 3d Screen Breast Bilateral  Result Date: 07/02/2019 CLINICAL DATA:  Screening. EXAM: DIGITAL SCREENING BILATERAL MAMMOGRAM WITH TOMO AND CAD COMPARISON:  Previous exam(s). ACR Breast Density Category b: There are scattered areas of fibroglandular density. FINDINGS: There are no findings suspicious for malignancy. Images were processed with CAD. IMPRESSION: No mammographic evidence of malignancy. A result letter of this screening mammogram will be mailed directly to the patient. RECOMMENDATION: Screening mammogram in one year. (Code:SM-B-01Y) BI-RADS CATEGORY  1: Negative. Electronically Signed   By: Lillia Mountain M.D.   On: 07/02/2019 15:02    Assessment & Plan:   Camey was seen today for hypothyroidism, hyperlipidemia and atrial fibrillation.  Diagnoses and all orders for this visit:  Acquired hypothyroidism- Her TSH is in the normal range.  She will remain on the current dose of levothyroxine. -     TSH; Future -     levothyroxine (SYNTHROID) 50 MCG tablet; Take 0.5 tablets (25 mcg total) by mouth daily before breakfast. Follow=up appt is due must see provider for future refills  Iron deficiency anemia secondary to inadequate dietary iron intake- Her H&H and iron levels are normal.  Will continue the current iron supplementation. -     IBC panel; Future -     CBC with  Differential/Platelet; Future -     Ferritin; Future  Dyslipidemia, goal LDL below 100- She has achieved her LDL goal and is doing well on the statin. -     Lipid panel; Future -     TSH; Future -     rosuvastatin (CRESTOR) 10 MG tablet; Take 1 tablet (10 mg total) by mouth daily. Follow=up appt is due must see provider for future refills  Vitamin D deficiency- I have asked her to treat this with a vitamin D supplement. -     VITAMIN D 25 Hydroxy (Vit-D Deficiency, Fractures); Future -     Cholecalciferol 50 MCG (2000 UT)  TABS; Take 2 tablets (4,000 Units total) by mouth daily.  Atrial flutter, paroxysmal ([place])- She is maintaining good rate and rhythm control.  Will continue anticoagulation with the DOAC. -     Basic metabolic panel; Future -     Protime-INR; Future  Anticoagulation management encounter -     Protime-INR; Future  Atrial fibrillation with RVR ([place])- See above.  Need for Tdap vaccination -     Tdap vaccine greater than or equal to 7yo IM  Pure hypercholesterolemia  Hypothyroidism, unspecified type   I have discontinued Royetta Crochet. Belluomini's Melatonin. I am also having her start on Cholecalciferol. Additionally, I am having her maintain her ferrous sulfate, Xarelto, flecainide, metoprolol tartrate, rosuvastatin, and levothyroxine.  Meds ordered this encounter  Medications  . Cholecalciferol 50 MCG (2000 UT) TABS    Sig: Take 2 tablets (4,000 Units total) by mouth daily.    Dispense:  180 tablet    Refill:  1  . rosuvastatin (CRESTOR) 10 MG tablet    Sig: Take 1 tablet (10 mg total) by mouth daily. Follow=up appt is due must see provider for future refills    Dispense:  90 tablet    Refill:  1  . levothyroxine (SYNTHROID) 50 MCG tablet    Sig: Take 0.5 tablets (25 mcg total) by mouth daily before breakfast. Follow=up appt is due must see provider for future refills    Dispense:  45 tablet    Refill:  1     Follow-up: Return in about 4 months (around  11/03/2019).  Scarlette Calico, MD

## 2019-07-21 ENCOUNTER — Ambulatory Visit (INDEPENDENT_AMBULATORY_CARE_PROVIDER_SITE_OTHER): Payer: Medicare Other | Admitting: *Deleted

## 2019-07-21 DIAGNOSIS — Z0000 Encounter for general adult medical examination without abnormal findings: Secondary | ICD-10-CM
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
## 2019-08-18 ENCOUNTER — Telehealth: Payer: Self-pay | Admitting: Internal Medicine

## 2019-08-18 NOTE — Telephone Encounter (Signed)
Medication Refill - Medication: Ambien  Has the patient contacted their pharmacy?Yes  (Agent: If no, request that the patient contact the pharmacy for the refill.) (Agent: If yes, when and what did the pharmacy advise?)  Preferred Pharmacy (with phone number or street name): [place], [place].  Agent: Please be advised that RX refills may take up to 3 business days. We ask that you follow-up with your pharmacy.

## 2019-08-19 ENCOUNTER — Other Ambulatory Visit: Payer: Self-pay | Admitting: Internal Medicine

## 2019-08-19 DIAGNOSIS — F5104 Psychophysiologic insomnia: Secondary | ICD-10-CM | POA: Insufficient documentation

## 2019-08-19 MED ORDER — BELSOMRA 15 MG PO TABS: 1.0000 | ORAL_TABLET | Freq: Every evening | ORAL | 0 refills | Status: DC | PRN

## 2019-08-19 NOTE — Telephone Encounter (Signed)
I do not think Ambien would be a good option for her. I recommend Belsomra.  I have sent the prescription to her pharmacy.  TJ

## 2019-08-19 NOTE — Telephone Encounter (Signed)
Spoke to pt and stated that she is having some trouble sleeping. Pt states that recently she has had trouble sleeping every night.   Pt is requesting an rx for ambien 10 mg.

## 2019-08-19 NOTE — Telephone Encounter (Signed)
Pt informed and pt stated she does not want to try anything new.  Offered pt an appt but pt refused.

## 2019-08-27 ENCOUNTER — Telehealth (HOSPITAL_COMMUNITY): Payer: Self-pay | Admitting: Cardiology

## 2019-08-27 NOTE — Telephone Encounter (Signed)
Patient left VM on triage line with concerns of SOB Attempted to return call x 3, no answer will try again later

## 2019-08-28 NOTE — Telephone Encounter (Signed)
Spoke w/pt added onto sch for tomorrow w/Dr [employer]

## 2019-08-28 NOTE — Telephone Encounter (Signed)
Patient called with complaints of  increased SOB with exertion. Patient reports she is fine while sitting/at rest and walking through her home however during her exercise walk recently she became very SOB and unable to continue.   Does not weigh daily so unable to report daily weights, denies recent medication changes, denies swelling,CP or change in diet

## 2019-08-29 ENCOUNTER — Ambulatory Visit (HOSPITAL_COMMUNITY)
Admission: RE | Admit: 2019-08-29 | Discharge: 2019-08-29 | Disposition: A | Discharge status: Home / Self Care | Payer: Medicare Other | Source: Ambulatory Visit | Attending: Internal Medicine | Admitting: Internal Medicine

## 2019-08-29 ENCOUNTER — Encounter (HOSPITAL_COMMUNITY): Payer: Self-pay | Admitting: Internal Medicine

## 2019-08-29 ENCOUNTER — Other Ambulatory Visit: Payer: Self-pay

## 2019-08-29 VITALS — BP 128/82 | HR 58 | Wt 222.2 lb

## 2019-08-29 DIAGNOSIS — I447 Left bundle-branch block, unspecified: Secondary | ICD-10-CM | POA: Insufficient documentation

## 2019-08-29 DIAGNOSIS — R079 Chest pain, unspecified: Secondary | ICD-10-CM

## 2019-08-29 DIAGNOSIS — Z7901 Long term (current) use of anticoagulants: Secondary | ICD-10-CM | POA: Diagnosis not present

## 2019-08-29 DIAGNOSIS — I5022 Chronic systolic (congestive) heart failure: Secondary | ICD-10-CM | POA: Diagnosis not present

## 2019-08-29 DIAGNOSIS — Z86010 Personal history of colonic polyps: Secondary | ICD-10-CM | POA: Diagnosis not present

## 2019-08-29 DIAGNOSIS — E039 Hypothyroidism, unspecified: Secondary | ICD-10-CM | POA: Insufficient documentation

## 2019-08-29 DIAGNOSIS — R0609 Other forms of dyspnea: Secondary | ICD-10-CM | POA: Diagnosis not present

## 2019-08-29 DIAGNOSIS — E559 Vitamin D deficiency, unspecified: Secondary | ICD-10-CM | POA: Insufficient documentation

## 2019-08-29 DIAGNOSIS — I440 Atrioventricular block, first degree: Secondary | ICD-10-CM | POA: Diagnosis not present

## 2019-08-29 DIAGNOSIS — E669 Obesity, unspecified: Secondary | ICD-10-CM | POA: Insufficient documentation

## 2019-08-29 DIAGNOSIS — Z79899 Other long term (current) drug therapy: Secondary | ICD-10-CM | POA: Diagnosis not present

## 2019-08-29 DIAGNOSIS — E785 Hyperlipidemia, unspecified: Secondary | ICD-10-CM | POA: Insufficient documentation

## 2019-08-29 DIAGNOSIS — I480 Paroxysmal atrial fibrillation: Secondary | ICD-10-CM | POA: Diagnosis not present

## 2019-08-29 DIAGNOSIS — Z79890 Hormone replacement therapy (postmenopausal): Secondary | ICD-10-CM | POA: Diagnosis not present

## 2019-08-29 DIAGNOSIS — Z6835 Body mass index (BMI) 35.0-35.9, adult: Secondary | ICD-10-CM | POA: Insufficient documentation

## 2019-08-29 DIAGNOSIS — Z86718 Personal history of other venous thrombosis and embolism: Secondary | ICD-10-CM | POA: Diagnosis not present

## 2019-08-29 DIAGNOSIS — Z86711 Personal history of pulmonary embolism: Secondary | ICD-10-CM | POA: Insufficient documentation

## 2019-08-29 DIAGNOSIS — R001 Bradycardia, unspecified: Secondary | ICD-10-CM | POA: Diagnosis not present

## 2019-08-29 DIAGNOSIS — R0600 Dyspnea, unspecified: Secondary | ICD-10-CM | POA: Insufficient documentation

## 2019-08-29 LAB — CBC
HCT: 42.3 % (ref 36.0–46.0)
Hemoglobin: 13.6 g/dL (ref 12.0–15.0)
MCH: 30.7 pg (ref 26.0–34.0)
MCHC: 32.2 g/dL (ref 30.0–36.0)
MCV: 95.5 fL (ref 80.0–100.0)
Platelets: 256 10*3/uL (ref 150–400)
RBC: 4.43 MIL/uL (ref 3.87–5.11)
RDW: 11.9 % (ref 11.5–15.5)
WBC: 5.7 10*3/uL (ref 4.0–10.5)
nRBC: 0 % (ref 0.0–0.2)

## 2019-08-29 LAB — COMPREHENSIVE METABOLIC PANEL
ALT: 17 U/L (ref 0–44)
AST: 20 U/L (ref 15–41)
Albumin: 4 g/dL (ref 3.5–5.0)
Alkaline Phosphatase: 71 U/L (ref 38–126)
Anion gap: 9 (ref 5–15)
BUN: 29 mg/dL — ABNORMAL HIGH (ref 8–23)
CO2: 25 mmol/L (ref 22–32)
Calcium: 9.4 mg/dL (ref 8.9–10.3)
Chloride: 106 mmol/L (ref 98–111)
Creatinine, Ser: 0.98 mg/dL (ref 0.44–1.00)
GFR calc Af Amer: >60 mL/min (ref >60)
GFR calc non Af Amer: 55 mL/min — ABNORMAL LOW (ref >60)
Glucose, Bld: 96 mg/dL (ref 70–99)
Potassium: 4.8 mmol/L (ref 3.5–5.1)
Sodium: 140 mmol/L (ref 135–145)
Total Bilirubin: 0.6 mg/dL (ref 0.3–1.2)
Total Protein: 7.2 g/dL (ref 6.5–8.1)

## 2019-08-29 MED ORDER — FUROSEMIDE 20 MG PO TABS: 20.0000 mg | ORAL_TABLET | Freq: Every day | ORAL | 3 refills | Status: DC | PRN

## 2019-08-29 MED ORDER — POTASSIUM CHLORIDE CRYS ER 20 MEQ PO TBCR: EXTENDED_RELEASE_TABLET | ORAL | 3 refills | Status: DC

## 2019-08-29 NOTE — Progress Notes (Signed)
ReDS Vest - 08/29/19 1052      ReDS Vest   Estimated volume prior to reading  Med    Fitting Posture  Sitting    Height Marker  Short    Ruler Value  29    Center Strip  Aligned    ReDS Value  31    Anatomical Comments  station C

## 2019-08-29 NOTE — Patient Instructions (Addendum)
Lab work done today. We will notify you of any abnormal lab work. No news is good news!  Non-Cardiac CT Angiography (CTA), is a special type of CT scan that uses a computer to produce multi-dimensional views of major blood vessels throughout the body. In CT angiography, a contrast material is injected through an IV to help visualize the blood vessels. We will contact you once your insurance approves this procedure in order to schedule it.  Your provider has recommended that  you wear a Zio Patch for 14 days.  This monitor will record your heart rhythm for our review.  IF you have any symptoms while wearing the monitor please press the button.  If you have any issues with the patch or you notice a red or orange light on it please call the company at [phone].  Once you remove the patch please mail it back to the company as soon as possible so we can get the results.   START Furosemide 20mg  as needed for weight gain of 3lbs in 24 hours or 5lbs in 1 week. For every dose of Furosemide taken please take 20 meq of potassium.  Please follow up with the [facility] in 4 months. Please give [place] a call in December 2020 at [phone] option #3 in order to schedule that appointment.  At the [facility], you and your health needs are our priority. As part of our continuing mission to provide you with exceptional heart care, we have created designated Provider Care Teams. These Care Teams include your primary Cardiologist (physician) and Advanced Practice Providers (APPs- Physician Assistants and Nurse Practitioners) who all work together to provide you with the care you need, when you need it.   You may see any of the following providers on your designated Care Team at your next follow up: Marland Kitchen Dr Glori Bickers . Dr Loralie Champagne . Darrick Grinder, NP   Please be sure to bring in all your medications bottles to every appointment.

## 2019-08-29 NOTE — Progress Notes (Addendum)
Patient ID: [name], female   DOB: June 25, 1941, 78 y.o.   MRN: [id number]    Advanced Heart Failure Clinic Note   HPI:  [name] is a 78 y/o woman with h/o of obesity, PAF and AFL s/p AFL ablation.   She was admitted in 11/13 with acute bilateral PE, rapid AF and heart failure after a long plane trip. .EF 40-45%.  On 10/28/12 Unsuccessful DCCV . Loaded amio and underwent repeat DC-CV on 11/13. Maintained on Xarelto. In 11/13 had AFL.  Amio increased to 400 bid. On 12/10/12 successful DC-CV.  Amiodarone was eventually stopped.   04/22/13 ECHO EF 65%, mild LVH, normal RV size and systolic function, PA systolic pressure 35 mmHg .Had recurrent AF in 9/15. Amiodarone was restarted and she was cardioverted to NSR on 09/14/14. Seen again on Oct 16. Asymptomatic. Was back in AFL with RVR at 150. Underwent DC-CV but reverted to AFL. S/p A flutter ablation 12/15/2014. Was seen by Dr Rayann Heman in follow up last 01/08/15 and was stable at that time with no symptoms. She was in NSR at that time. Metoprolol stopped.   Admitted 10/31/16-11/02/16 with SOB, AFib. Started on flecainide.   Echo 12/19 EF 55-60%   Today she presents for an acute visit for increased shortness of breath. Had been doing fine. On Tuesday am went out for a long walk. Got 2,000 steps in and became very SOB and wasn't sure she could make it back. No CP. No palpitations. Had to walk very slowly home. Since that time has been fine but hasn't tried another long walk. No edema, orthopnea or PND. Has not stopped her blood thinner. No fevers or chills.    ROS: All systems negative except as listed in HPI, PMH and Problem List.  Past Medical History:  Diagnosis Date  . 1st degree AV block   . Atrial flutter ([place])    a. s/p CTI ablation 11/2014  . Cataract   . CHF (congestive heart failure) ([place]) 10/2012   after long plane ride  . Diverticulosis 2002   Dr. Fuller Plan  . DVT (deep venous thrombosis) ([place]) 2013   "S/P a long airplane  ride"  . Endometrial polyp   . Fasting hyperglycemia    A1C  within normal limits  . Heart murmur    was told she has one but has never affected her  . Hyperlipidemia   . Hypothyroidism   . LVH (left ventricular hypertrophy) 2014   Mild, Noted on ECHO  . Obese   . Pancreatitis due to common bile duct stone   . Paroxysmal atrial fibrillation (HCC)    RVR  . Personal history of colonic polyps 2002   tubular adenoma  . PMB (postmenopausal bleeding)   . Pulmonary embolism ([place]) 2013   "S/P a long airplane ride"  . Right bundle branch block (RBBB)   . Sinus bradycardia   . Tortuous colon   . Vitamin D deficiency   . Wears glasses     Current Outpatient Medications  Medication Sig Dispense Refill  . Cholecalciferol 50 MCG (2000 UT) TABS Take 2 tablets (4,000 Units total) by mouth daily. 180 tablet 1  . flecainide (TAMBOCOR) 100 MG tablet TAKE ONE TABLET EVERY TWELVE HOURS. 180 tablet 3  . levothyroxine (SYNTHROID) 50 MCG tablet Take 0.5 tablets (25 mcg total) by mouth daily before breakfast. Follow=up appt is due must see provider for future refills 45 tablet 1  . Melatonin 10 MG TABS Take 10 mg by  mouth daily.    . metoprolol tartrate (LOPRESSOR) 25 MG tablet TAKE (1/2) TABLET TWICE DAILY. 90 tablet 0  . rosuvastatin (CRESTOR) 10 MG tablet Take 1 tablet (10 mg total) by mouth daily. Follow=up appt is due must see provider for future refills 90 tablet 1  . Suvorexant (BELSOMRA) 15 MG TABS Take 1 tablet by mouth at bedtime as needed. 90 tablet 0  . XARELTO 20 MG TABS tablet TAKE 1 TABLET ONCE DAILY. 90 tablet 3   No current facility-administered medications for this encounter.    Vitals:   08/29/19 1043  BP: 128/82  Pulse: (!) 58  SpO2: 97%  Weight: 100.8 kg (222 lb 3.2 oz)    Wt Readings from Last 3 Encounters:  08/29/19 100.8 kg (222 lb 3.2 oz)  07/03/19 101.5 kg (223 lb 12 oz)  12/30/18 94.9 kg (209 lb 4 oz)   Reds Clip 31%    PHYSICAL EXAM: General:  Well  appearing. No resp difficulty HEENT: normal Neck: supple. no JVD. Carotids 2+ bilat; no bruits. No lymphadenopathy or thryomegaly appreciated. Cor: PMI nondisplaced. Regular rate & rhythm. No rubs, gallops or murmurs. Lungs: clear Abdomen: obese soft, nontender, nondistended. No hepatosplenomegaly. No bruits or masses. Good bowel sounds. Extremities: no cyanosis, clubbing, rash, edema Neuro: alert & orientedx3, cranial nerves grossly intact. moves all 4 extremities w/o difficulty. Affect pleasant   EKG: SR 58 bpm 1st AVB LBBB (168 Monica) Personally reviewed   ASSESSMENT & PLAN: 1. Acute dyspnea - unclear etiology  - differential includes recurrent PE (unlikely on Xarelto), recurrent AF (in NSR today), acute HF (Reds 31% today), angina or other - will place zip patch and discussed use of AliveCor evice for long-term monitoring - check CTA coronaries - use lasix as needed -labs today  2.  Paroxysmal Afib RVR:  - Has had history of recurrent AFL despite DC-CV (x 2 in 2013 and x 1 in 2015), now, as above s/p AFL ablation by Dr Rayann Heman. Long history of AF/AFL by EKGs.  Afib s/p DCCV in 09/22/16. - Recurrent AF in 11/17 now NSR on flecainide - Maintaining NSR on ECG today. Plan as above - Continue metoprolol.  - This patients CHA2DS2-VASc Score and unadjusted Ischemic Stroke Rate (% per year) is equal to 7.2 % stroke rate/year from a score of 5 Above score calculated as 1 point each if present [CHF, HTN, DM, Vascular=MI/PAD/Aortic Plaque, Age if 65-74, or Female], 2 points each if present [Age > 75, or Stroke/TIA/TE] - Continue Xarelto for anticogulation. No bleeding issues.   3) Hx of DVT and PEs - Continue Xarelto 20mg  daily.   4) History of chronic systolic CHF: in the setting of acute PE.  - EF recovered 12/19 EF 55-60%   - Reds Clip reading 31%.  - Repeat ECHO    Glori Bickers MD 11:10 AM 08/29/2019

## 2019-09-05 ENCOUNTER — Other Ambulatory Visit (HOSPITAL_COMMUNITY): Payer: Self-pay | Admitting: Internal Medicine

## 2019-09-09 ENCOUNTER — Other Ambulatory Visit (HOSPITAL_COMMUNITY): Payer: Self-pay | Admitting: *Deleted

## 2019-09-09 DIAGNOSIS — R0600 Dyspnea, unspecified: Secondary | ICD-10-CM

## 2019-09-09 NOTE — Progress Notes (Signed)
Orders only

## 2019-09-12 ENCOUNTER — Telehealth (HOSPITAL_COMMUNITY): Payer: Self-pay | Admitting: Vascular Surgery

## 2019-09-12 NOTE — Telephone Encounter (Signed)
Left pt detailed message giving CT APPT 9/25. @ 130, asked pt to call back to confirm

## 2019-09-17 ENCOUNTER — Telehealth (HOSPITAL_COMMUNITY): Payer: Self-pay | Admitting: Emergency Medicine

## 2019-09-17 NOTE — Telephone Encounter (Signed)
Left message on voicemail with name and callback number Desire Fulp RN Navigator Cardiac Imaging [facility] [phone] Office [phone] Cell  

## 2019-09-18 DIAGNOSIS — R002 Palpitations: Secondary | ICD-10-CM | POA: Diagnosis not present

## 2019-09-19 ENCOUNTER — Ambulatory Visit (HOSPITAL_COMMUNITY)
Admission: RE | Admit: 2019-09-19 | Discharge: 2019-09-19 | Disposition: A | Discharge status: Home / Self Care | Payer: Medicare Other | Source: Ambulatory Visit | Attending: Internal Medicine | Admitting: Internal Medicine

## 2019-09-19 ENCOUNTER — Other Ambulatory Visit: Payer: Self-pay

## 2019-09-19 DIAGNOSIS — Z79899 Other long term (current) drug therapy: Secondary | ICD-10-CM | POA: Insufficient documentation

## 2019-09-19 DIAGNOSIS — R0600 Dyspnea, unspecified: Secondary | ICD-10-CM | POA: Insufficient documentation

## 2019-09-19 DIAGNOSIS — Z7901 Long term (current) use of anticoagulants: Secondary | ICD-10-CM | POA: Insufficient documentation

## 2019-09-19 DIAGNOSIS — E039 Hypothyroidism, unspecified: Secondary | ICD-10-CM | POA: Insufficient documentation

## 2019-09-19 DIAGNOSIS — I5032 Chronic diastolic (congestive) heart failure: Secondary | ICD-10-CM | POA: Diagnosis not present

## 2019-09-19 DIAGNOSIS — I480 Paroxysmal atrial fibrillation: Secondary | ICD-10-CM | POA: Diagnosis not present

## 2019-09-19 DIAGNOSIS — Z86718 Personal history of other venous thrombosis and embolism: Secondary | ICD-10-CM | POA: Insufficient documentation

## 2019-09-19 DIAGNOSIS — E785 Hyperlipidemia, unspecified: Secondary | ICD-10-CM | POA: Insufficient documentation

## 2019-09-19 DIAGNOSIS — Z86711 Personal history of pulmonary embolism: Secondary | ICD-10-CM | POA: Insufficient documentation

## 2019-09-19 DIAGNOSIS — Z79890 Hormone replacement therapy (postmenopausal): Secondary | ICD-10-CM | POA: Diagnosis not present

## 2019-09-19 MED ORDER — NITROGLYCERIN 0.4 MG SL SUBL
SUBLINGUAL_TABLET | SUBLINGUAL | Status: AC
  Filled 2019-09-19: qty 2

## 2019-09-19 MED ORDER — METOPROLOL TARTRATE 5 MG/5ML IV SOLN
5.0000 mg | INTRAVENOUS | Status: DC | PRN
  Administered 2019-09-19 (×2): 5 mg via INTRAVENOUS
  Filled 2019-09-19: qty 5

## 2019-09-19 MED ORDER — METOPROLOL TARTRATE 5 MG/5ML IV SOLN
INTRAVENOUS | Status: AC
  Filled 2019-09-19: qty 5

## 2019-09-19 MED ORDER — IOHEXOL 350 MG/ML SOLN
80.0000 mL | Freq: Once | INTRAVENOUS | Status: AC | PRN
  Administered 2019-09-19: 14:00:00 80 mL via INTRAVENOUS

## 2019-09-19 MED ORDER — NITROGLYCERIN 0.4 MG SL SUBL
0.8000 mg | SUBLINGUAL_TABLET | Freq: Once | SUBLINGUAL | Status: AC
  Administered 2019-09-19: 0.8 mg via SUBLINGUAL
  Filled 2019-09-19: qty 25

## 2019-09-23 NOTE — Addendum Note (Signed)
Encounter addended by: Micki Riley, RN on: 09/23/2019 10:52 AM  Actions taken: Imaging Exam ended

## 2019-10-02 ENCOUNTER — Encounter: Payer: Self-pay | Admitting: Gynecology

## 2019-12-08 ENCOUNTER — Other Ambulatory Visit (HOSPITAL_COMMUNITY): Payer: Self-pay | Admitting: Internal Medicine

## 2019-12-08 ENCOUNTER — Other Ambulatory Visit: Payer: Self-pay | Admitting: Internal Medicine

## 2019-12-08 DIAGNOSIS — E039 Hypothyroidism, unspecified: Secondary | ICD-10-CM

## 2019-12-08 DIAGNOSIS — E785 Hyperlipidemia, unspecified: Secondary | ICD-10-CM

## 2020-01-05 ENCOUNTER — Encounter: Payer: Self-pay | Admitting: Internal Medicine

## 2020-01-05 ENCOUNTER — Other Ambulatory Visit: Payer: Self-pay

## 2020-01-05 ENCOUNTER — Ambulatory Visit (INDEPENDENT_AMBULATORY_CARE_PROVIDER_SITE_OTHER): Payer: Medicare Other | Admitting: Internal Medicine

## 2020-01-05 VITALS — BP 138/82 | HR 70 | Temp 97.8°F | Ht 66.0 in | Wt 215.0 lb

## 2020-01-05 DIAGNOSIS — F5104 Psychophysiologic insomnia: Secondary | ICD-10-CM | POA: Diagnosis not present

## 2020-01-05 DIAGNOSIS — D508 Other iron deficiency anemias: Secondary | ICD-10-CM

## 2020-01-05 DIAGNOSIS — Z23 Encounter for immunization: Secondary | ICD-10-CM | POA: Diagnosis not present

## 2020-01-05 DIAGNOSIS — E039 Hypothyroidism, unspecified: Secondary | ICD-10-CM

## 2020-01-05 DIAGNOSIS — G479 Sleep disorder, unspecified: Secondary | ICD-10-CM

## 2020-01-05 LAB — CBC WITH DIFFERENTIAL/PLATELET
Basophils Absolute: 0.1 10*3/uL (ref 0.0–0.1)
Basophils Relative: 1.1 % (ref 0.0–3.0)
Eosinophils Absolute: 0.1 10*3/uL (ref 0.0–0.7)
Eosinophils Relative: 2 % (ref 0.0–5.0)
HCT: 42.4 % (ref 36.0–46.0)
Hemoglobin: 13.8 g/dL (ref 12.0–15.0)
Lymphocytes Relative: 12.4 % (ref 12.0–46.0)
Lymphs Abs: 0.8 10*3/uL (ref 0.7–4.0)
MCHC: 32.6 g/dL (ref 30.0–36.0)
MCV: 92.9 fl (ref 78.0–100.0)
Monocytes Absolute: 0.4 10*3/uL (ref 0.1–1.0)
Monocytes Relative: 5.6 % (ref 3.0–12.0)
Neutro Abs: 5.2 10*3/uL (ref 1.4–7.7)
Neutrophils Relative %: 78.9 % — ABNORMAL HIGH (ref 43.0–77.0)
Platelets: 257 10*3/uL (ref 150.0–400.0)
RBC: 4.57 Mil/uL (ref 3.87–5.11)
RDW: 13.3 % (ref 11.5–15.5)
WBC: 6.6 10*3/uL (ref 4.0–10.5)

## 2020-01-05 LAB — TSH: TSH: 3.52 u[IU]/mL (ref 0.35–4.50)

## 2020-01-05 MED ORDER — BELSOMRA 20 MG PO TABS: 1.0000 | ORAL_TABLET | Freq: Every evening | ORAL | 5 refills | Status: DC | PRN

## 2020-01-05 NOTE — Progress Notes (Signed)
Subjective:  Patient ID: [name], female    DOB: [birth year]  Age: 79 y.o. MRN: [id number]  CC: Hypothyroidism, Atrial Fibrillation, and Anemia  This visit occurred during the SARS-CoV-2 public health emergency.  Safety protocols were in place, including screening questions prior to the visit, additional usage of staff PPE, and extensive cleaning of exam room while observing appropriate contact time as indicated for disinfecting solutions.    HPI [name] presents for f/up - She continues to complain of insomnia.  She has tried zolpidem but says it does not work.  I recently prescribed suvorexant but she said the co-pay was $100 so she has not tried it yet.  She has frequent awakenings which she says causes next-day fatigue and irritability.  Outpatient Medications Prior to Visit  Medication Sig Dispense Refill  . Cholecalciferol 50 MCG (2000 UT) TABS Take 2 tablets (4,000 Units total) by mouth daily. 180 tablet 1  . flecainide (TAMBOCOR) 100 MG tablet TAKE ONE TABLET EVERY TWELVE HOURS. 180 tablet 0  . levothyroxine (SYNTHROID) 50 MCG tablet TAKE 1/2 TABLET ONCE A DAY IN THE MORNING ON AN EMPTY STOMACH. 45 tablet 1  . Melatonin 10 MG TABS Take 10 mg by mouth daily.    . metoprolol tartrate (LOPRESSOR) 25 MG tablet TAKE (1/2) TABLET TWICE DAILY. 90 tablet 3  . potassium chloride SA (K-DUR) 20 MEQ tablet Take 1 tab with furosemide as needed. 30 tablet 3  . rosuvastatin (CRESTOR) 10 MG tablet TAKE 1 TABLET EACH DAY. 90 tablet 1  . XARELTO 20 MG TABS tablet TAKE 1 TABLET ONCE DAILY. 90 tablet 0  . furosemide (LASIX) 20 MG tablet Take 1 tablet (20 mg total) by mouth daily as needed. For weight gain greater than 3lbs in 1 day 30 tablet 3  . Suvorexant (BELSOMRA) 15 MG TABS Take 1 tablet by mouth at bedtime as needed. (Patient not taking: Reported on 01/05/2020) 90 tablet 0   No facility-administered medications prior to visit.    ROS Review of Systems    Constitutional: Positive for fatigue. Negative for unexpected weight change.  HENT: Negative.   Respiratory: Negative for cough, chest tightness, shortness of breath and wheezing.   Cardiovascular: Negative for chest pain, palpitations and leg swelling.  Gastrointestinal: Negative for abdominal pain, constipation, diarrhea, nausea and vomiting.  Endocrine: Negative for cold intolerance and heat intolerance.  Genitourinary: Negative for difficulty urinating.  Musculoskeletal: Negative for arthralgias and myalgias.  Skin: Negative.  Negative for color change and pallor.  Neurological: Negative.  Negative for dizziness, weakness, light-headedness and headaches.  Hematological: Negative for adenopathy. Does not bruise/bleed easily.  Psychiatric/Behavioral: Positive for dysphoric mood and sleep disturbance. Negative for decreased concentration. The patient is not nervous/anxious.     Objective:  BP 138/82 (BP Location: Left Arm, Patient Position: Sitting, Cuff Size: Large)   Pulse 70   Temp 97.8 F (36.6 C) (Oral)   Ht 5\' 6"  (1.676 m)   Wt 215 lb (97.5 kg)   SpO2 98%   BMI 34.70 kg/m   BP Readings from Last 3 Encounters:  01/05/20 138/82  09/19/19 (!) 130/59  08/29/19 128/82    Wt Readings from Last 3 Encounters:  01/05/20 215 lb (97.5 kg)  08/29/19 222 lb 3.2 oz (100.8 kg)  07/03/19 223 lb 12 oz (101.5 kg)    Physical Exam Vitals reviewed.  Constitutional:      Appearance: She is obese.  HENT:     Nose: Nose normal.  Mouth/Throat:     Mouth: Mucous membranes are moist.  Eyes:     General: No scleral icterus.    Conjunctiva/sclera: Conjunctivae normal.  Cardiovascular:     Rate and Rhythm: Normal rate and regular rhythm.     Heart sounds: No murmur.  Pulmonary:     Effort: Pulmonary effort is normal.     Breath sounds: No stridor. No wheezing, rhonchi or rales.  Abdominal:     General: Abdomen is protuberant. Bowel sounds are normal. There is no distension.      Palpations: Abdomen is soft. There is no hepatomegaly or splenomegaly.     Tenderness: There is no abdominal tenderness.     Hernia: No hernia is present.  Musculoskeletal:        General: Normal range of motion.     Cervical back: Neck supple.     Right lower leg: No edema.     Left lower leg: No edema.  Lymphadenopathy:     Cervical: No cervical adenopathy.  Skin:    General: Skin is warm and dry.     Coloration: Skin is not pale.  Neurological:     General: No focal deficit present.     Mental Status: She is alert.  Psychiatric:        Mood and Affect: Mood normal.     Lab Results  Component Value Date   WBC 6.6 01/05/2020   HGB 13.8 01/05/2020   HCT 42.4 01/05/2020   PLT 257.0 01/05/2020   GLUCOSE 96 08/29/2019   CHOL 163 07/03/2019   TRIG 49.0 07/03/2019   HDL 77.10 07/03/2019   LDLDIRECT 127.1 07/08/2013   LDLCALC 76 07/03/2019   ALT 17 08/29/2019   AST 20 08/29/2019   NA 140 08/29/2019   K 4.8 08/29/2019   CL 106 08/29/2019   CREATININE 0.98 08/29/2019   BUN 29 (H) 08/29/2019   CO2 25 08/29/2019   TSH 3.52 01/05/2020   INR 1.4 (H) 07/03/2019   HGBA1C 5.5 03/13/2008   MICROALBUR 0.8 12/03/2006    CT CORONARY MORPH W/CTA COR W/SCORE W/CA W/CM &/OR WO/CM  Addendum Date: 09/19/2019   ADDENDUM REPORT: 09/19/2019 17:25 CLINICAL DATA:  Dyspnea EXAM: Cardiac CTA MEDICATIONS: Sub lingual nitro. 4mg  x 2 TECHNIQUE: The patient was scanned on a Siemens [id number] slice scanner. Gantry rotation speed was 250 msecs. Collimation was 0.6 mm. A 100 kV prospective scan was triggered in the ascending thoracic aorta at 35-75% of the R-R interval. Average HR during the scan was 60 bpm. The 3D data set was interpreted on a dedicated work station using MPR, MIP and VRT modes. A total of 80cc of contrast was used. FINDINGS: Non-cardiac: See separate report from [facility]. Pulmonary veins drained normally to the left atrium. Calcium Score: 0.4 Agatston units. Coronary Arteries:  Right dominant with no anomalies LM: Short, no plaque or stenosis. LAD system: Mixed plaque proximal LAD, no significant stenosis. Circumflex system: No plaque or stenosis. RCA system: No plaque or stenosis. IMPRESSION: 1. Coronary artery calcium score 0.4 Agatston units. This places the patient in the 15th percentile for age and gender, suggesting low risk for future cardiac events. 2.  No obstructive coronary disease. Dalton Mclean Electronically Signed   By: Loralie Champagne M.D.   On: 09/19/2019 17:25   Result Date: 09/19/2019 EXAM: OVER-READ INTERPRETATION  CT CHEST The following report is an over-read performed by radiologist Dr. Vinnie Langton of [facility], [place] on 09/19/2019. This over-read does not include interpretation  of cardiac or coronary anatomy or pathology. The coronary calcium score/coronary CTA interpretation by the cardiologist is attached. COMPARISON:  None. FINDINGS: Within the visualized portions of the thorax there are no suspicious appearing pulmonary nodules or masses, there is no acute consolidative airspace disease, no pleural effusions, no pneumothorax and no lymphadenopathy. Visualized portions of the upper abdomen are unremarkable. There are no aggressive appearing lytic or blastic lesions noted in the visualized portions of the skeleton. Multiple old healed right-sided rib fractures. IMPRESSION: No significant incidental noncardiac findings are noted. Electronically Signed: By: Vinnie Langton M.D. On: 09/19/2019 14:28    Assessment & Plan:   [name] was seen today for hypothyroidism, atrial fibrillation and anemia.  Diagnoses and all orders for this visit:  Acquired hypothyroidism- Her TSH is in the normal range.  She will remain on the current dose of levothyroxine. -     TSH  Disturbance in sleep behavior  Psychophysiological insomnia- She agrees to try suvorexant.  I written the 20 mg dosage and she will take 1/2 tablet qHS.  This will help her with her cost  concerns. -     Suvorexant (BELSOMRA) 20 MG TABS; Take 1 tablet by mouth at bedtime as needed.  Iron deficiency anemia secondary to inadequate dietary iron intake- Her H&H are normal now. -     CBC with Differential  Need for pneumococcal vaccination -     Pneumococcal polysaccharide vaccine 23-valent greater than or equal to 2yo subcutaneous/IM   I have discontinued Royetta Crochet. [place]'s Belsomra. I am also having her start on Belsomra. Additionally, I am having her maintain her Cholecalciferol, Melatonin, furosemide, potassium chloride SA, metoprolol tartrate, Xarelto, rosuvastatin, flecainide, and levothyroxine.  Meds ordered this encounter  Medications  . Suvorexant (BELSOMRA) 20 MG TABS    Sig: Take 1 tablet by mouth at bedtime as needed.    Dispense:  30 tablet    Refill:  5     Follow-up: Return in about 6 months (around 07/04/2020).  Scarlette Calico, MD

## 2020-01-05 NOTE — Patient Instructions (Signed)

## 2020-01-07 ENCOUNTER — Telehealth (HOSPITAL_COMMUNITY): Payer: Self-pay

## 2020-01-07 NOTE — Telephone Encounter (Signed)

## 2020-01-08 ENCOUNTER — Encounter (HOSPITAL_COMMUNITY): Payer: Self-pay | Admitting: Internal Medicine

## 2020-01-08 ENCOUNTER — Other Ambulatory Visit: Payer: Self-pay

## 2020-01-08 ENCOUNTER — Ambulatory Visit (HOSPITAL_COMMUNITY)
Admission: RE | Admit: 2020-01-08 | Discharge: 2020-01-08 | Disposition: A | Discharge status: Home / Self Care | Payer: Medicare Other | Source: Ambulatory Visit | Attending: Internal Medicine | Admitting: Internal Medicine

## 2020-01-08 VITALS — BP 130/72 | HR 64 | Wt 214.8 lb

## 2020-01-08 DIAGNOSIS — I5022 Chronic systolic (congestive) heart failure: Secondary | ICD-10-CM | POA: Insufficient documentation

## 2020-01-08 DIAGNOSIS — Z7901 Long term (current) use of anticoagulants: Secondary | ICD-10-CM | POA: Insufficient documentation

## 2020-01-08 DIAGNOSIS — Z86010 Personal history of colonic polyps: Secondary | ICD-10-CM | POA: Diagnosis not present

## 2020-01-08 DIAGNOSIS — Z86711 Personal history of pulmonary embolism: Secondary | ICD-10-CM | POA: Insufficient documentation

## 2020-01-08 DIAGNOSIS — Z79890 Hormone replacement therapy (postmenopausal): Secondary | ICD-10-CM | POA: Diagnosis not present

## 2020-01-08 DIAGNOSIS — E785 Hyperlipidemia, unspecified: Secondary | ICD-10-CM | POA: Diagnosis not present

## 2020-01-08 DIAGNOSIS — Z86718 Personal history of other venous thrombosis and embolism: Secondary | ICD-10-CM | POA: Diagnosis not present

## 2020-01-08 DIAGNOSIS — Z6834 Body mass index (BMI) 34.0-34.9, adult: Secondary | ICD-10-CM | POA: Insufficient documentation

## 2020-01-08 DIAGNOSIS — E669 Obesity, unspecified: Secondary | ICD-10-CM | POA: Insufficient documentation

## 2020-01-08 DIAGNOSIS — Z79899 Other long term (current) drug therapy: Secondary | ICD-10-CM | POA: Diagnosis not present

## 2020-01-08 DIAGNOSIS — I440 Atrioventricular block, first degree: Secondary | ICD-10-CM | POA: Insufficient documentation

## 2020-01-08 DIAGNOSIS — E039 Hypothyroidism, unspecified: Secondary | ICD-10-CM | POA: Diagnosis not present

## 2020-01-08 DIAGNOSIS — I447 Left bundle-branch block, unspecified: Secondary | ICD-10-CM

## 2020-01-08 DIAGNOSIS — K5790 Diverticulosis of intestine, part unspecified, without perforation or abscess without bleeding: Secondary | ICD-10-CM | POA: Diagnosis not present

## 2020-01-08 DIAGNOSIS — I480 Paroxysmal atrial fibrillation: Secondary | ICD-10-CM | POA: Diagnosis not present

## 2020-01-08 LAB — BRAIN NATRIURETIC PEPTIDE: B Natriuretic Peptide: 28.6 pg/mL (ref 0.0–100.0)

## 2020-01-08 LAB — CBC
HCT: 43.9 % (ref 36.0–46.0)
Hemoglobin: 13.6 g/dL (ref 12.0–15.0)
MCH: 30 pg (ref 26.0–34.0)
MCHC: 31 g/dL (ref 30.0–36.0)
MCV: 96.7 fL (ref 80.0–100.0)
Platelets: 285 10*3/uL (ref 150–400)
RBC: 4.54 MIL/uL (ref 3.87–5.11)
RDW: 12.3 % (ref 11.5–15.5)
WBC: 6.8 10*3/uL (ref 4.0–10.5)
nRBC: 0 % (ref 0.0–0.2)

## 2020-01-08 LAB — BASIC METABOLIC PANEL
Anion gap: 10 (ref 5–15)
BUN: 25 mg/dL — ABNORMAL HIGH (ref 8–23)
CO2: 26 mmol/L (ref 22–32)
Calcium: 9.6 mg/dL (ref 8.9–10.3)
Chloride: 104 mmol/L (ref 98–111)
Creatinine, Ser: 0.88 mg/dL (ref 0.44–1.00)
GFR calc Af Amer: >60 mL/min (ref >60)
GFR calc non Af Amer: >60 mL/min (ref >60)
Glucose, Bld: 102 mg/dL — ABNORMAL HIGH (ref 70–99)
Potassium: 4.6 mmol/L (ref 3.5–5.1)
Sodium: 140 mmol/L (ref 135–145)

## 2020-01-08 LAB — TSH: TSH: 3.283 u[IU]/mL (ref 0.350–4.500)

## 2020-01-08 NOTE — Progress Notes (Signed)
Patient ID: [name], female   DOB: February 18, 1941, 79 y.o.   MRN: [id number]    Advanced Heart Failure Clinic Note   HPI:  [name] is a 79 y/o woman with h/o of obesity, PAF and AFL s/p AFL ablation.   She was admitted in 11/13 with acute bilateral PE, rapid AF and heart failure after a long plane trip. .EF 40-45%.  On 10/28/12 Unsuccessful DCCV . Loaded amio and underwent repeat DC-CV on 11/13. Maintained on Xarelto. In 11/13 had AFL.  Amio increased to 400 bid. On 12/10/12 successful DC-CV.  Amiodarone was eventually stopped.   04/22/13 ECHO EF 65%, mild LVH, normal RV size and systolic function, PA systolic pressure 35 mmHg .Had recurrent AF in 9/15. Amiodarone was restarted and she was cardioverted to NSR on 09/14/14. Seen again on Oct 16. Asymptomatic. Was back in AFL with RVR at 150. Underwent DC-CV but reverted to AFL. S/p A flutter ablation 12/15/2014. Was seen by Dr Rayann Heman in follow up last 01/08/15 and was stable at that time with no symptoms. She was in NSR at that time. Metoprolol stopped.   Admitted 10/31/16-11/02/16 with SOB, AFib. Started on flecainide.   Echo 12/19 EF 55-60%   In 9/20 we saw her for unexplained dyspnea. REDS 31% Zio patch ok (no AF). Cardiac CT mild plaque prox LAD otherwise normal.   Today she presents for routine f/u. Says she is doing fine. Walking and doing all her activities without problem. No palpitations. No edema. Following her HR with [employer] - > no AF.  Notices that she gets SOB when she slumps over. No bleeding with Xarelto.  ROS: All systems negative except as listed in HPI, PMH and Problem List.  Past Medical History:  Diagnosis Date  . 1st degree AV block   . Atrial flutter ([place])    a. s/p CTI ablation 11/2014  . Cataract   . CHF (congestive heart failure) ([place]) 10/2012   after long plane ride  . Diverticulosis 2002   Dr. Fuller Plan  . DVT (deep venous thrombosis) ([place]) 2013   "S/P a long airplane ride"  . Endometrial polyp   .  Fasting hyperglycemia    A1C  within normal limits  . Heart murmur    was told she has one but has never affected her  . Hyperlipidemia   . Hypothyroidism   . LVH (left ventricular hypertrophy) 2014   Mild, Noted on ECHO  . Obese   . Pancreatitis due to common bile duct stone   . Paroxysmal atrial fibrillation (HCC)    RVR  . Personal history of colonic polyps 2002   tubular adenoma  . PMB (postmenopausal bleeding)   . Pulmonary embolism ([place]) 2013   "S/P a long airplane ride"  . Right bundle branch block (RBBB)   . Sinus bradycardia   . Tortuous colon   . Vitamin D deficiency   . Wears glasses     Current Outpatient Medications  Medication Sig Dispense Refill  . Cholecalciferol 50 MCG (2000 UT) TABS Take 2 tablets (4,000 Units total) by mouth daily. 180 tablet 1  . flecainide (TAMBOCOR) 100 MG tablet TAKE ONE TABLET EVERY TWELVE HOURS. 180 tablet 0  . levothyroxine (SYNTHROID) 50 MCG tablet TAKE 1/2 TABLET ONCE A DAY IN THE MORNING ON AN EMPTY STOMACH. 45 tablet 1  . Melatonin 10 MG TABS Take 10 mg by mouth daily.    . metoprolol tartrate (LOPRESSOR) 25 MG tablet TAKE (1/2) TABLET TWICE DAILY.  90 tablet 3  . potassium chloride SA (K-DUR) 20 MEQ tablet Take 1 tab with furosemide as needed. 30 tablet 3  . rosuvastatin (CRESTOR) 10 MG tablet TAKE 1 TABLET EACH DAY. 90 tablet 1  . XARELTO 20 MG TABS tablet TAKE 1 TABLET ONCE DAILY. 90 tablet 0   No current facility-administered medications for this encounter.   Vitals:   01/08/20 1004  BP: 130/72  Pulse: 64  SpO2: 100%  Weight: 97.4 kg (214 lb 12.8 oz)    Wt Readings from Last 3 Encounters:  01/08/20 97.4 kg (214 lb 12.8 oz)  01/05/20 97.5 kg (215 lb)  08/29/19 100.8 kg (222 lb 3.2 oz)    PHYSICAL EXAM: General:  Well appearing. No resp difficulty HEENT: normal Neck: supple. no JVD. Carotids 2+ bilat; no bruits. No lymphadenopathy or thryomegaly appreciated. Cor: PMI nondisplaced. Regular rate & rhythm. No rubs,  gallops or murmurs. Lungs: clear Abdomen: obese soft, nontender, nondistended. No hepatosplenomegaly. No bruits or masses. Good bowel sounds. Extremities: no cyanosis, clubbing, rash, edema Neuro: alert & orientedx3, cranial nerves grossly intact. moves all 4 extremities w/o difficulty. Affect pleasant   EKG: SR 63 bpm 1st AVB (221ms) LBBB (176 Monica) Personally reviewed   ASSESSMENT & PLAN:  1.  Paroxysmal Afib RVR:  - Has had history of recurrent AFL despite DC-CV (x 2 in 2013 and x 1 in 2015), now, as above s/p AFL ablation by Dr Rayann Heman. Long history of AF/AFL by EKGs.  Afib s/p DCCV in 09/22/16.  - Recurrent AF in 11/17 now NSR on flecainide/metoprolol. 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 Will refer back to Dr. Rayann Heman. - Maintaining NSR on ECG today. Zio 9/20 no AF - This patients CHA2DS2-VASc Score and unadjusted Ischemic Stroke Rate (% per year) is equal to 7.2 % stroke rate/year from a score of 5 Above score calculated as 1 point each if present [CHF, HTN, DM, Vascular=MI/PAD/Aortic Plaque, Age if 65-74, or Female], 2 points each if present [Age > 75, or Stroke/TIA/TE] - Continue Xarelto for anticogulation. No bleeding issues.   3) Hx of DVT and PEs - Continue Xarelto 20mg  daily. No bleeding  4) History of chronic systolic CHF: in the setting of acute PE.  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 Like due in part to flecainide - will arrange f/u televisit with Dr. Rayann Heman to see if we should adjust - on only low-dose lopressor at 12.5mg  daily - no indication for PPM at this point.      Glori Bickers MD 10:26 AM 01/08/2020

## 2020-01-08 NOTE — Addendum Note (Signed)
Encounter addended by: Scarlette Calico, RN on: 01/08/2020 10:59 AM  Actions taken: Order list changed, Diagnosis association updated, Charge Capture section accepted, Clinical Note Signed

## 2020-01-08 NOTE — Patient Instructions (Signed)
Labs done today, your results will be posted on MyChart, we will call you for abnormal results  Your physician recommends that you schedule a follow-up appointment with Dr Rayann Heman, his office will call you to schedule this  Please give [place] a call in 1 year to schedule your follow up appointment.

## 2020-01-12 ENCOUNTER — Encounter: Payer: Self-pay | Admitting: Internal Medicine

## 2020-01-15 ENCOUNTER — Other Ambulatory Visit: Payer: Self-pay | Admitting: Internal Medicine

## 2020-01-15 ENCOUNTER — Telehealth: Payer: Self-pay

## 2020-01-15 DIAGNOSIS — F5104 Psychophysiologic insomnia: Secondary | ICD-10-CM

## 2020-01-15 MED ORDER — BELSOMRA 20 MG PO TABS: 1.0000 | ORAL_TABLET | Freq: Every evening | ORAL | 5 refills | Status: DC | PRN

## 2020-01-19 ENCOUNTER — Encounter: Payer: Self-pay | Admitting: Internal Medicine

## 2020-01-19 ENCOUNTER — Telehealth (INDEPENDENT_AMBULATORY_CARE_PROVIDER_SITE_OTHER): Payer: Medicare Other | Admitting: Internal Medicine

## 2020-01-19 ENCOUNTER — Telehealth: Payer: Self-pay

## 2020-01-19 VITALS — BP 130/82 | HR 58 | Ht 66.0 in | Wt 212.0 lb

## 2020-01-19 DIAGNOSIS — I447 Left bundle-branch block, unspecified: Secondary | ICD-10-CM

## 2020-01-19 DIAGNOSIS — I440 Atrioventricular block, first degree: Secondary | ICD-10-CM

## 2020-01-19 DIAGNOSIS — I4819 Other persistent atrial fibrillation: Secondary | ICD-10-CM

## 2020-01-19 MED ORDER — FLECAINIDE ACETATE 50 MG PO TABS: 50.0000 mg | ORAL_TABLET | Freq: Two times a day (BID) | ORAL | 3 refills | Status: DC

## 2020-01-19 NOTE — Telephone Encounter (Signed)
-----   Message from Thompson Grayer, MD sent at 01/19/2020  9:18 AM EST ----- Reduce flecainide to 50mg  BID Follow-up in AF clinic in 2 weeks for an ekg
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
## 2020-01-19 NOTE — Telephone Encounter (Signed)
Called and spoke with patient.  She is aware of appt 02/03/20 @8 :30 am with Adline Peals, PA.

## 2020-01-29 ENCOUNTER — Telehealth: Payer: Self-pay | Admitting: Internal Medicine

## 2020-01-29 NOTE — Chronic Care Management (AMB) (Signed)
Chronic Care Management   Note  01/29/2020 Name: [name] MRN: [id number] DOB: [birth year]  [name] is a 79 y.o. year old female who is a primary care patient of Janith Lima, MD. I reached out to [name] by phone today in response to a referral sent by Ms. Monica Noon Muha's PCP, Janith Lima, MD.   [name] was given information about Chronic Care Management services today including:  1. CCM service includes personalized support from designated clinical staff supervised by her physician, including individualized plan of care and coordination with other care providers 2. 24/7 contact phone numbers for assistance for urgent and routine care needs. 3. Service will only be billed when office clinical staff spend 20 minutes or more in a month to coordinate care. 4. Only one practitioner may furnish and bill the service in a calendar month. 5. The patient may stop CCM services at any time (effective at the end of the month) by phone call to the office staff. 6. The patient will be responsible for cost sharing (co-pay) of up to 20% of the service fee (after annual deductible is met).  Patient agreed to services and verbal consent obtained.   Follow up plan:  [place]

## 2020-02-02 ENCOUNTER — Other Ambulatory Visit: Payer: Self-pay | Admitting: Internal Medicine

## 2020-02-02 ENCOUNTER — Ambulatory Visit: Payer: Medicare Other | Admitting: Pharmacist

## 2020-02-02 DIAGNOSIS — F5104 Psychophysiologic insomnia: Secondary | ICD-10-CM

## 2020-02-02 DIAGNOSIS — D508 Other iron deficiency anemias: Secondary | ICD-10-CM

## 2020-02-02 DIAGNOSIS — Z86711 Personal history of pulmonary embolism: Secondary | ICD-10-CM

## 2020-02-02 DIAGNOSIS — E785 Hyperlipidemia, unspecified: Secondary | ICD-10-CM

## 2020-02-02 DIAGNOSIS — E039 Hypothyroidism, unspecified: Secondary | ICD-10-CM

## 2020-02-02 DIAGNOSIS — I4891 Unspecified atrial fibrillation: Secondary | ICD-10-CM

## 2020-02-02 NOTE — Patient Instructions (Addendum)
Visit Information  Thank you for meeting with me to discuss your medications! I look forward to working with you to achieve your health care goals. Below is a summary of what we talked about during the visit:  Goals Addressed            This Visit's Progress   . Improve sleep       Discussed sleep hygiene, including turning off TV at least 30 min before bed and breathing exercises to relax. Pt will also try taking Belsomra 30 min before bed instead of at 2am when waking up    . Pharmacy Care Plan       Current Barriers:  . Chronic Disease Management support, education, and care coordination needs related to Atrial Fibrillation, HTN, HLD, and insomnia  Pharmacist Clinical Goal(s):  Marland Kitchen Maintain sinus rhythm with rate/rhythm control medications . Improve insomnia through proper medication administration and behavioral changes . Maintain BP and lipid at goal  Interventions: . Comprehensive medication review performed. . Pt will try taking Belsomra 30 minutes before bedtime . Discussed sleep hygiene and turning off TV/noise at least 30 min before bedtime  Patient Self Care Activities:  . Patient verbalizes understanding of plan to change behavior before sleep, Self administers medications as prescribed, Calls pharmacy for medication refills, and Calls provider office for new concerns or questions  Initial goal documentation      Ms. Anfinson was given information about Chronic Care Management services today including:  1. CCM service includes personalized support from designated clinical staff supervised by her physician, including individualized plan of care and coordination with other care providers 2. 24/7 contact phone numbers for assistance for urgent and routine care needs. 3. Service will only be billed when office clinical staff spend 20 minutes or more in a month to coordinate care. 4. Only one practitioner may furnish and bill the service in a calendar month. 5. The  patient may stop CCM services at any time (effective at the end of the month) by phone call to the office staff. 6. The patient will be responsible for cost sharing (co-pay) of up to 20% of the service fee (after annual deductible is met).  Patient agreed to services and verbal consent obtained.   Print copy of patient instructions provided.  The pharmacy team will reach out to the patient again over the next 14-21 days.    Charlene [name], PharmD Clinical Pharmacist [facility] at [facility] [phone]

## 2020-02-02 NOTE — Chronic Care Management (AMB) (Addendum)
Chronic Care Management Pharmacy  Name: [name]  MRN: [id number] DOB: 09/25/1941  Chief Complaint/ HPI  [name],  79 y.o. , female presents for their Initial CCM visit with the clinical pharmacist via telephone.  PCP : Janith Lima, MD  Their chronic conditions include: Atrial fibrillation, history of PE, Dyslipidemia, iron deficiency anemia, overactive bladder, hypothyroidism.  Discussed COVID vaccine, pt is due to get second dose later this week.   Office Visits: 01/05/20 Dr [name]: c/o insomnia, tried zolpidem, Belsomra copay was $100 so she did not try it. Pt agreed to try 1/2 tablet to alleviate cost concerns.   Consult Visits:  01/19/20 Dr Rayann Heman (cardiology): reduced flecainide to 50 mg BID due to age, HR < 60. F/u 2 weeks, may consider further reduction to 25 mg BID if she remains in NSR. Pt wants to avoid PPM.   01/08/20 Dr Haroldine Laws (cardiology): conduction disease progression, referred to Dr Rayann Heman (EP). No med changes.   Medications: Outpatient Encounter Medications as of 02/02/2020  Medication Sig   flecainide (TAMBOCOR) 50 MG tablet Take 1 tablet (50 mg total) by mouth 2 (two) times daily.   levothyroxine (SYNTHROID) 50 MCG tablet TAKE 1/2 TABLET ONCE A DAY IN THE MORNING ON AN EMPTY STOMACH.   metoprolol tartrate (LOPRESSOR) 25 MG tablet TAKE (1/2) TABLET TWICE DAILY.   potassium chloride SA (K-DUR) 20 MEQ tablet Take 1 tab with furosemide as needed.   rosuvastatin (CRESTOR) 10 MG tablet TAKE 1 TABLET EACH DAY.   Suvorexant (BELSOMRA) 20 MG TABS Take 1 tablet by mouth at bedtime as needed.   XARELTO 20 MG TABS tablet TAKE 1 TABLET ONCE DAILY.   Cholecalciferol 50 MCG (2000 UT) TABS Take 2 tablets (4,000 Units total) by mouth daily. (Patient not taking: Reported on 02/02/2020)   No facility-administered encounter medications on file as of 02/02/2020.     Current Diagnosis/Assessment:   Goals Addressed             This Visit's  Progress    Improve sleep       Discussed sleep hygiene, including turning off TV at least 30 min before bed and breathing exercises to relax. Pt will also try taking Belsomra 30 min before bed instead of at 2am when waking up     Pharmacy Care Plan       Current Barriers:  Chronic Disease Management support, education, and care coordination needs related to Atrial Fibrillation, HTN, HLD, and insomnia  Pharmacist Clinical Goal(s):  Maintain sinus rhythm with rate/rhythm control medications Improve insomnia through proper medication administration and behavioral changes Maintain BP and lipid at goal  Interventions: Comprehensive medication review performed. Pt will try taking Belsomra 30 minutes before bedtime Discussed sleep hygiene and turning off TV/noise at least 30 min before bedtime  Patient Self Care Activities:  Patient verbalizes understanding of plan to change behavior before sleep, Self administers medications as prescribed, Calls pharmacy for medication refills, and Calls provider office for new concerns or questions  Initial goal documentation         AFIB   Patient is currently rate controlled. HR 60 BPM or below, she is asymptomatic when HR in 50s and during episodes of Afib  Patient has failed these meds in past: amiodarone, diltiazem Patient is currently controlled on the following medications: flecainide 50 mg BID, metoprolol tartrate 12.5 mg (1/2 of 25 mg) BID, Xarelto 20 mg daily  We discussed: adherence with rate/rhythm control medications and need for  lifelong anticoagulation given her history of multiple VTE and Afib. Pt denies ever having issues with bleeding.  Plan  Continue current medications    Hypertension    Office blood pressures are  BP Readings from Last 3 Encounters:  01/19/20 130/82  01/08/20 130/72  01/05/20 138/82    Patient has failed these meds in the past: n/a  Patient is currently controlled on: metoprolol tartrate 25 mg  BID  Patient checks BP at home several times per month  Patient home BP readings are ranging: 130s/80s  We discussed: diet and exercise. Pt goes to gym and aerobics classes 3 days a week.   Plan  Continue current medications and control with diet and exercise    Dyslipidemia   Lipid Panel     Component Value Date/Time   CHOL 163 07/03/2019 1056   CHOL 213 (H) 07/14/2015 0738   TRIG 49.0 07/03/2019 1056   TRIG 56 07/14/2015 0738   TRIG 56 12/03/2006 1030   HDL 77.10 07/03/2019 1056   HDL 69 07/14/2015 0738   CHOLHDL 2 07/03/2019 1056   VLDL 9.8 07/03/2019 1056   LDLCALC 76 07/03/2019 1056   LDLCALC 133 (H) 07/14/2015 0738   LDLDIRECT 127.1 07/08/2013 0942   LDL 76 Patient has failed these meds in past: n/a Patient is currently controlled on the following medications: rosuvastatin 10 mg daily  We discussed:  diet and exercise, pt is quite active. She reports adherence to statin and denies any issues.  Plan  Continue current medications   Insomnia   Patient has failed these meds in past: zolpidem Patient is currently controlled on the following medications: suvorexant 20 mg daily  We discussed:  Sleep hygiene, including turning off TV 30 min before bedtime, trying breathing exercise to relax before bedtime, avoidance of caffeine after noon, moderate alcohol use (2-3 drinks/wk). Pt currently takes melatonin 10 mg at bedtime (~midnight), then takes Belsomra at 2am when she wakes up.   Plan  Continue current medications  Take Belsomra 30 min before bedtime Sleep hygiene  Hypothyroidism   TSH 3.28  Patient has failed these meds in past: n/a Patient is currently controlled on the following medications: levothyroxine 25 mg (1/2 of 50 mcg) daily   We discussed: adherence to levothyroxine, pt has been on same dose for many years.  Plan  Continue current medications  Medication management   Pt uses a 7-day pill box that she sets up each week Pt uses [facility], they all know her by name and help her get 90 day supplies when she move to [place] each winter for 3 months. Pt is very comfortably managing her medications, she knows what each med is for and how to take it Pt reports medications are generally affordable, 2 brand name drugs Xarelto and Belsomra are Tier 3, ~$45 each    Follow up:  - 2-3 week check in for sleep hygiene/Belsomra use - phone visit with CPP in 3 months   Charlene [name], PharmD Clinical Pharmacist [facility] at [facility] [phone]

## 2020-02-03 ENCOUNTER — Ambulatory Visit (HOSPITAL_COMMUNITY)
Admission: RE | Admit: 2020-02-03 | Discharge: 2020-02-03 | Disposition: A | Discharge status: Home / Self Care | Payer: Medicare Other | Source: Ambulatory Visit | Attending: Physician Assistant | Admitting: Physician Assistant

## 2020-02-03 ENCOUNTER — Telehealth: Payer: Self-pay

## 2020-02-03 ENCOUNTER — Other Ambulatory Visit: Payer: Self-pay

## 2020-02-03 VITALS — BP 154/80 | HR 55 | Ht 66.0 in | Wt 212.4 lb

## 2020-02-03 DIAGNOSIS — R001 Bradycardia, unspecified: Secondary | ICD-10-CM | POA: Insufficient documentation

## 2020-02-03 DIAGNOSIS — Z79890 Hormone replacement therapy (postmenopausal): Secondary | ICD-10-CM | POA: Diagnosis not present

## 2020-02-03 DIAGNOSIS — D6869 Other thrombophilia: Secondary | ICD-10-CM | POA: Insufficient documentation

## 2020-02-03 DIAGNOSIS — E039 Hypothyroidism, unspecified: Secondary | ICD-10-CM | POA: Diagnosis not present

## 2020-02-03 DIAGNOSIS — Z7901 Long term (current) use of anticoagulants: Secondary | ICD-10-CM | POA: Diagnosis not present

## 2020-02-03 DIAGNOSIS — E785 Hyperlipidemia, unspecified: Secondary | ICD-10-CM | POA: Diagnosis not present

## 2020-02-03 DIAGNOSIS — Z86718 Personal history of other venous thrombosis and embolism: Secondary | ICD-10-CM | POA: Diagnosis not present

## 2020-02-03 DIAGNOSIS — E669 Obesity, unspecified: Secondary | ICD-10-CM | POA: Insufficient documentation

## 2020-02-03 DIAGNOSIS — I4891 Unspecified atrial fibrillation: Secondary | ICD-10-CM

## 2020-02-03 DIAGNOSIS — I4819 Other persistent atrial fibrillation: Secondary | ICD-10-CM | POA: Diagnosis not present

## 2020-02-03 DIAGNOSIS — Z885 Allergy status to narcotic agent status: Secondary | ICD-10-CM | POA: Diagnosis not present

## 2020-02-03 DIAGNOSIS — I447 Left bundle-branch block, unspecified: Secondary | ICD-10-CM | POA: Diagnosis not present

## 2020-02-03 DIAGNOSIS — Z79899 Other long term (current) drug therapy: Secondary | ICD-10-CM | POA: Insufficient documentation

## 2020-02-03 DIAGNOSIS — Z86711 Personal history of pulmonary embolism: Secondary | ICD-10-CM | POA: Insufficient documentation

## 2020-02-03 DIAGNOSIS — Z96653 Presence of artificial knee joint, bilateral: Secondary | ICD-10-CM | POA: Insufficient documentation

## 2020-02-03 DIAGNOSIS — Z880 Allergy status to penicillin: Secondary | ICD-10-CM | POA: Insufficient documentation

## 2020-02-03 DIAGNOSIS — I440 Atrioventricular block, first degree: Secondary | ICD-10-CM | POA: Diagnosis not present

## 2020-02-03 DIAGNOSIS — Z6834 Body mass index (BMI) 34.0-34.9, adult: Secondary | ICD-10-CM | POA: Insufficient documentation

## 2020-02-03 MED ORDER — FLECAINIDE ACETATE 50 MG PO TABS: ORAL_TABLET | ORAL | Status: DC

## 2020-02-03 NOTE — Telephone Encounter (Signed)
Amb referral for Pharmacy [phone])  Per Mendel Ryder, Pharmacist at our practice, Referral entered. Verbal approval from PCP.
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
## 2020-03-15 ENCOUNTER — Other Ambulatory Visit (HOSPITAL_COMMUNITY): Payer: Self-pay | Admitting: Internal Medicine

## 2020-03-19 ENCOUNTER — Other Ambulatory Visit (HOSPITAL_COMMUNITY): Payer: Self-pay

## 2020-03-19 MED ORDER — FLECAINIDE ACETATE 50 MG PO TABS: 50.0000 mg | ORAL_TABLET | Freq: Two times a day (BID) | ORAL | 3 refills | Status: DC

## 2020-04-29 DIAGNOSIS — H04123 Dry eye syndrome of bilateral lacrimal glands: Secondary | ICD-10-CM | POA: Diagnosis not present

## 2020-04-29 DIAGNOSIS — H25811 Combined forms of age-related cataract, right eye: Secondary | ICD-10-CM | POA: Diagnosis not present

## 2020-04-29 DIAGNOSIS — H401433 Capsular glaucoma with pseudoexfoliation of lens, bilateral, severe stage: Secondary | ICD-10-CM | POA: Diagnosis not present

## 2020-04-29 DIAGNOSIS — H5213 Myopia, bilateral: Secondary | ICD-10-CM | POA: Diagnosis not present

## 2020-06-01 ENCOUNTER — Ambulatory Visit: Payer: Self-pay | Admitting: Pharmacist

## 2020-06-01 DIAGNOSIS — E785 Hyperlipidemia, unspecified: Secondary | ICD-10-CM

## 2020-06-01 DIAGNOSIS — G479 Sleep disorder, unspecified: Secondary | ICD-10-CM

## 2020-06-01 DIAGNOSIS — I4891 Unspecified atrial fibrillation: Secondary | ICD-10-CM

## 2020-06-01 NOTE — Chronic Care Management (AMB) (Signed)
Chronic Care Management Pharmacy  Name: [name]  MRN: [id number] DOB: 20-Jan-1941  Chief Complaint/ HPI  [name],  79 y.o. , female presents for their Initial CCM visit with the clinical pharmacist via telephone.  PCP : Janith Lima, MD  Their chronic conditions include: Atrial fibrillation, history of PE, Dyslipidemia, iron deficiency anemia, overactive bladder, hypothyroidism.  Discussed COVID vaccine, pt is due to get second dose later this week.   Office Visits: 01/05/20 Dr [name]: c/o insomnia, tried zolpidem, Belsomra copay was $100 so she did not try it. Pt agreed to try 1/2 tablet to alleviate cost concerns.   Consult Visits:  01/19/20 Dr Rayann Heman (cardiology): reduced flecainide to 50 mg BID due to age, HR < 60. F/u 2 weeks, may consider further reduction to 25 mg BID if she remains in NSR. Pt wants to avoid PPM.   01/08/20 Dr Haroldine Laws (cardiology): conduction disease progression, referred to Dr Rayann Heman (EP). No med changes.   Medications: Outpatient Encounter Medications as of 06/01/2020  Medication Sig  . flecainide (TAMBOCOR) 50 MG tablet Take 1 tablet (50 mg total) by mouth 2 (two) times daily.  Marland Kitchen FLUZONE HIGH-DOSE QUADRIVALENT 0.7 ML SUSY   . levothyroxine (SYNTHROID) 50 MCG tablet TAKE 1/2 TABLET ONCE A DAY IN THE MORNING ON AN EMPTY STOMACH.  . metoprolol tartrate (LOPRESSOR) 25 MG tablet TAKE (1/2) TABLET TWICE DAILY.  . rosuvastatin (CRESTOR) 10 MG tablet TAKE 1 TABLET EACH DAY.  . Suvorexant (BELSOMRA) 20 MG TABS Take 1 tablet by mouth at bedtime as needed.  Alveda Reasons 20 MG TABS tablet TAKE 1 TABLET ONCE DAILY.   No facility-administered encounter medications on file as of 06/01/2020.     Current Diagnosis/Assessment:   SDOH Interventions     Most Recent Value  SDOH Interventions  Financial Strain Interventions  Intervention Not Indicated      Goals Addressed            This Visit's Progress   . Pharmacy Care Plan      CARE PLAN ENTRY  Current Barriers:  . Chronic Disease Management support, education, and care coordination needs related to Hypertension, Hyperlipidemia, Atrial Fibrillation, and Insomnia   Hypertension BP Readings from Last 3 Encounters:  02/03/20 (!) 154/80  01/19/20 130/82  01/08/20 130/72 .  Pharmacist Clinical Goal(s): o Over the next 180 days, patient will work with PharmD and providers to maintain BP goal <140/90 . Current regimen:  o Metoprolol tartrate 12.5 mg (1/2 of 50 mg) twice a day . Interventions: o Discussed BP goals and benefits of medication, diet, and exercise to reduce cardiovascular risk . Patient self care activities - Over the next 180 days, patient will: o Check BP 1-2 times per week, document, and provide at future appointments o Ensure daily salt intake < 2300 mg/day  Hyperlipidemia Lab Results  Component Value Date   LDLDIRECT 127.1 07/08/2013   LDLCALC 76 07/03/2019   LDLCALC 133 (H) 07/14/2015 .  Pharmacist Clinical Goal(s): o Over the next 180 days, patient will work with PharmD and providers to maintain LDL goal < 100 . Current regimen:  o Rosuvastatin 10 mg daily . Interventions: o Discussed cholesterol goals and benefits of statin, diet and exercise to reduce cardiovascular risk . Patient self care activities - Over the next 180 days, patient will: o Continue medication as prescribed o Continue low cholesterol diet and exercise routine  Atrial Fibrillation Pulse Readings from Last 3 Encounters:  02/03/20 (!) 55  01/19/20 (!) 58  01/08/20 64 .  Pharmacist Clinical Goal(s) o Over the next 180 days, patient will work with PharmD and providers to optimize therapy . Current regimen:  o flecainide 50 mg BID,  o metoprolol tartrate 12.5 mg (1/2 of 25 mg) BID,  o Xarelto 20 mg daily . Interventions: o Discussed benefits of Xarelto for lifelong reduction in stroke risk and blood clots . Patient self care activities - Over the next 180 days,  patient will: o Continue medications as prescribed o Monitor for signs and symptoms of bleeding  Insomnia . Pharmacist Clinical Goal(s) o Over the next 180 days, patient will work with PharmD and providers to improve sleep  . Current regimen:  o Belsomra 20 mg at bedtime as needed o Melatonin 10 mg at bedtime . Interventions: o Recommended to take Belsomra 30 min before bedtime for optimal benefit o Discussed calming sleep routine before bedtime . Patient self care activities - Over the next 180 days, patient will: o Continue medications as prescribed o Follow up with PCP as needed  Medication management . Pharmacist Clinical Goal(s): o Over the next 180 days, patient will work with PharmD and providers to maintain optimal medication adherence . Current pharmacy: [facility] . Interventions o Comprehensive medication review performed. o Continue current medication management strategy . Patient self care activities - Over the next 180 days, patient will: o Focus on medication adherence by pill box o Take medications as prescribed o Report any questions or concerns to PharmD and/or provider(s)  Please see past updates related to this goal by clicking on the "Past Updates" button in the selected goal         AFIB   Patient is currently rate controlled. Pulse Readings from Last 3 Encounters:  02/03/20 (!) 55  01/19/20 (!) 58  01/08/20 64   Patient has failed these meds in past: amiodarone, diltiazem Patient is currently controlled on the following medications:   flecainide 50 mg BID,   metoprolol tartrate 12.5 mg (1/2 of 25 mg) BID,   Xarelto 20 mg daily  We discussed: adherence with rate/rhythm control medications and need for lifelong anticoagulation given her history of multiple VTE and Afib. Pt denies ever having issues with bleeding.  Plan  Continue current medications    Hypertension   BP goal < 140/90  Office blood pressures are  BP Readings  from Last 3 Encounters:  02/03/20 (!) 154/80  01/19/20 130/82  01/08/20 130/72   Patient checks BP at home several times per month  Patient home BP readings are ranging: 130s/80s  Patient has failed these meds in the past: n/a Patient is currently controlled on:   metoprolol tartrate 12.5 mg BID  We discussed: diet and exercise. Pt goes to gym and aerobics classes 3 days a week.   Plan  Continue current medications and control with diet and exercise    Hyperlipidemia   Lipid Panel     Component Value Date/Time   CHOL 163 07/03/2019 1056   CHOL 213 (H) 07/14/2015 0738   TRIG 49.0 07/03/2019 1056   TRIG 56 07/14/2015 0738   TRIG 56 12/03/2006 1030   HDL 77.10 07/03/2019 1056   HDL 69 07/14/2015 0738   CHOLHDL 2 07/03/2019 1056   VLDL 9.8 07/03/2019 1056   LDLCALC 76 07/03/2019 1056   LDLCALC 133 (H) 07/14/2015 0738   LDLDIRECT 127.1 07/08/2013 0942   The 10-year ASCVD risk score Mikey Bussing DC Jr., et al., 2013) is: 34.7%  Values used to calculate the score:     Age: 62 years     Sex: Female     Is Non-Hispanic African American: No     Diabetic: No     Tobacco smoker: No     Systolic Blood Pressure: 935 mmHg     Is BP treated: No     HDL Cholesterol: 77.1 mg/dL     Total Cholesterol: 163 mg/dL  Patient has failed these meds in past: n/a Patient is currently controlled on the following medications:   rosuvastatin 10 mg daily  We discussed:  diet and exercise, pt is quite active. She reports adherence to statin and denies any issues.  Plan  Continue current medications   Insomnia   Patient has failed these meds in past: zolpidem Patient is currently controlled on the following medications: s  Suvorexant (Belsomra) 20 mg HS prn  Melatonin 10 mg HS  We discussed: Last visit discussed importance of sleep hygiene and taking Belsomra 30 min before bedtime. Pt reports she tried this and sleep did not improve; she reports Ambien has helped her the most in the  past; counseled on long term side effects of Ambien and safety issues with women over 65.   Plan  Continue current medications    Hypothyroidism   Lab Results  Component Value Date/Time   TSH 3.283 01/08/2020 11:22 AM   TSH 3.52 01/05/2020 08:43 AM   TSH 3.35 07/03/2019 10:56 AM   Patient has failed these meds in past: n/a Patient is currently controlled on the following medications:   levothyroxine 25 mg (1/2 of 50 mcg) daily   We discussed: adherence to levothyroxine, pt has been on same dose for many years.  Plan  Continue current medications  Medication Management   Pt uses [facility] for all medications Uses pill box? Yes Pt endorses 100% compliance  We discussed: Pt is very comfortably managing her medications, she knows what each med is for and how to take it Pt reports medications are generally affordable, 2 brand name drugs Xarelto and Belsomra are Tier 3, ~$45 each Pt and her husband go to [place] for 3 months each winter, and coordinate with [facility] ahead of time to get adequate supply of meds.  Plan  Continue current medication management strategy    Follow up: 6 month phone visit  Charlene [name], PharmD Clinical Pharmacist [facility] [phone]

## 2020-06-01 NOTE — Patient Instructions (Addendum)
Visit Information  Phone number for Pharmacist: [phone]  Goals Addressed            This Visit's Progress   . Pharmacy Care Plan       CARE PLAN ENTRY  Current Barriers:  . Chronic Disease Management support, education, and care coordination needs related to Hypertension, Hyperlipidemia, Atrial Fibrillation, and Insomnia   Hypertension BP Readings from Last 3 Encounters:  02/03/20 (!) 154/80  01/19/20 130/82  01/08/20 130/72 .  Pharmacist Clinical Goal(s): o Over the next 180 days, patient will work with PharmD and providers to maintain BP goal <140/90 . Current regimen:  o Metoprolol tartrate 12.5 mg (1/2 of 50 mg) twice a day . Interventions: o Discussed BP goals and benefits of medication, diet, and exercise to reduce cardiovascular risk . Patient self care activities - Over the next 180 days, patient will: o Check BP 1-2 times per week, document, and provide at future appointments o Ensure daily salt intake < 2300 mg/day  Hyperlipidemia Lab Results  Component Value Date   LDLDIRECT 127.1 07/08/2013   LDLCALC 76 07/03/2019   LDLCALC 133 (H) 07/14/2015 .  Pharmacist Clinical Goal(s): o Over the next 180 days, patient will work with PharmD and providers to maintain LDL goal < 100 . Current regimen:  o Rosuvastatin 10 mg daily . Interventions: o Discussed cholesterol goals and benefits of statin, diet and exercise to reduce cardiovascular risk . Patient self care activities - Over the next 180 days, patient will: o Continue medication as prescribed o Continue low cholesterol diet and exercise routine  Atrial Fibrillation Pulse Readings from Last 3 Encounters:  02/03/20 (!) 55  01/19/20 (!) 58  01/08/20 64 .  Pharmacist Clinical Goal(s) o Over the next 180 days, patient will work with PharmD and providers to optimize therapy . Current regimen:  o flecainide 50 mg BID,  o metoprolol tartrate 12.5 mg (1/2 of 25 mg) BID,  o Xarelto 20 mg  daily . Interventions: o Discussed benefits of Xarelto for lifelong reduction in stroke risk and blood clots . Patient self care activities - Over the next 180 days, patient will: o Continue medications as prescribed o Monitor for signs and symptoms of bleeding  Insomnia . Pharmacist Clinical Goal(s) o Over the next 180 days, patient will work with PharmD and providers to improve sleep  . Current regimen:  o Belsomra 20 mg at bedtime as needed o Melatonin 10 mg at bedtime . Interventions: o Recommended to take Belsomra 30 min before bedtime for optimal benefit o Discussed calming sleep routine before bedtime . Patient self care activities - Over the next 180 days, patient will: o Continue medications as prescribed o Follow up with PCP as needed  Medication management . Pharmacist Clinical Goal(s): o Over the next 180 days, patient will work with PharmD and providers to maintain optimal medication adherence . Current pharmacy: [facility] . Interventions o Comprehensive medication review performed. o Continue current medication management strategy . Patient self care activities - Over the next 180 days, patient will: o Focus on medication adherence by pill box o Take medications as prescribed o Report any questions or concerns to PharmD and/or provider(s)  Please see past updates related to this goal by clicking on the "Past Updates" button in the selected goal        The patient verbalized understanding of instructions provided today and declined a print copy of patient instruction materials.   Telephone follow up appointment with pharmacy  team member scheduled for: 6 months  Charlene [name], PharmD Clinical Pharmacist [facility] [phone]  Quality Sleep Information, Adult Quality sleep is important for your mental and physical health. It also improves your quality of life. Quality sleep means you:  Are asleep for most of the time  you are in bed.  Fall asleep within 30 minutes.  Wake up no more than once a night.  Are awake for no longer than 20 minutes if you do wake up during the night. Most adults need 7-8 hours of quality sleep each night. How can poor sleep affect me? If you do not get enough quality sleep, you may have:  Mood swings.  Daytime sleepiness.  Confusion.  Decreased reaction time.  Sleep disorders, such as insomnia and sleep apnea.  Difficulty with: ? Solving problems. ? Coping with stress. ? Paying attention. These issues may affect your performance and productivity at work, school, and at home. Lack of sleep may also put you at higher risk for accidents, suicide, and risky behaviors. If you do not get quality sleep you may also be at higher risk for several health problems, including:  Infections.  Type 2 diabetes.  Heart disease.  High blood pressure.  Obesity.  Worsening of long-term conditions, like arthritis, kidney disease, depression, Parkinson's disease, and epilepsy. What actions can I take to get more quality sleep?      Stick to a sleep schedule. Go to sleep and wake up at about the same time each day. Do not try to sleep less on weekdays and make up for lost sleep on weekends. This does not work.  Try to get about 30 minutes of exercise on most days. Do not exercise 2-3 hours before going to bed.  Limit naps during the day to 30 minutes or less.  Do not use any products that contain nicotine or tobacco, such as cigarettes or e-cigarettes. If you need help quitting, ask your health care provider.  Do not drink caffeinated beverages for at least 8 hours before going to bed. Coffee, tea, and some sodas contain caffeine.  Do not drink alcohol close to bedtime.  Do not eat large meals close to bedtime.  Do not take naps in the late afternoon.  Try to get at least 30 minutes of sunlight every day. Morning sunlight is best.  Make time to relax before bed.  Reading, listening to music, or taking a hot bath promotes quality sleep.  Make your bedroom a place that promotes quality sleep. Keep your bedroom dark, quiet, and at a comfortable room temperature. Make sure your bed is comfortable. Take out sleep distractions like TV, a computer, smartphone, and bright lights.  If you are lying awake in bed for longer than 20 minutes, get up and do a relaxing activity until you feel sleepy.  Work with your health care provider to treat medical conditions that may affect sleeping, such as: ? Nasal obstruction. ? Snoring. ? Sleep apnea and other sleep disorders.  Talk to your health care provider if you think any of your prescription medicines may cause you to have difficulty falling or staying asleep.  If you have sleep problems, talk with a sleep consultant. If you think you have a sleep disorder, talk with your health care provider about getting evaluated by a specialist. Where to find more information  [place] website: https://sleepfoundation.org  National Heart, Lung, and [place] ([place]): http://www.saunders.info/.pdf  Centers for Disease Control and Prevention (CDC):  LearningDermatology.pl Contact a health care provider if you:  Have trouble getting to sleep or staying asleep.  Often wake up very early in the morning and cannot get back to sleep.  Have daytime sleepiness.  Have daytime sleep attacks of suddenly falling asleep and sudden muscle weakness (narcolepsy).  Have a tingling sensation in your legs with a strong urge to move your legs (restless legs syndrome).  Stop breathing briefly during sleep (sleep apnea).  Think you have a sleep disorder or are taking a medicine that is affecting your quality of sleep. Summary  Most adults need 7-8 hours of quality sleep each night.  Getting enough quality sleep is an important part of health and well-being.  Make your bedroom  a place that promotes quality sleep and avoid things that may cause you to have poor sleep, such as alcohol, caffeine, smoking, and large meals.  Talk to your health care provider if you have trouble falling asleep or staying asleep. This information is not intended to replace advice given to you by your health care provider. Make sure you discuss any questions you have with your health care provider. Document Revised: 03/20/2018 Document Reviewed: 03/20/2018 Elsevier Patient Education  [place].

## 2020-06-02 ENCOUNTER — Other Ambulatory Visit: Payer: Self-pay | Admitting: Internal Medicine

## 2020-06-02 DIAGNOSIS — Z1231 Encounter for screening mammogram for malignant neoplasm of breast: Secondary | ICD-10-CM

## 2020-06-03 ENCOUNTER — Other Ambulatory Visit (HOSPITAL_COMMUNITY): Payer: Self-pay | Admitting: Physician Assistant

## 2020-06-03 ENCOUNTER — Other Ambulatory Visit (HOSPITAL_COMMUNITY): Payer: Self-pay | Admitting: Internal Medicine

## 2020-06-03 ENCOUNTER — Other Ambulatory Visit: Payer: Self-pay | Admitting: Internal Medicine

## 2020-06-03 DIAGNOSIS — E039 Hypothyroidism, unspecified: Secondary | ICD-10-CM

## 2020-06-03 DIAGNOSIS — E785 Hyperlipidemia, unspecified: Secondary | ICD-10-CM

## 2020-06-15 DIAGNOSIS — L821 Other seborrheic keratosis: Secondary | ICD-10-CM | POA: Diagnosis not present

## 2020-06-15 DIAGNOSIS — D1801 Hemangioma of skin and subcutaneous tissue: Secondary | ICD-10-CM | POA: Diagnosis not present

## 2020-06-17 ENCOUNTER — Telehealth: Payer: Self-pay

## 2020-06-17 NOTE — Telephone Encounter (Signed)
Spoke with pt regarding her virtual appt on 06/21/20. Pt stated she did not have any questions at this time and confirmed virtual visit.

## 2020-06-21 ENCOUNTER — Other Ambulatory Visit: Payer: Self-pay

## 2020-06-21 ENCOUNTER — Telehealth: Payer: Self-pay

## 2020-06-21 ENCOUNTER — Encounter: Payer: Self-pay | Admitting: Internal Medicine

## 2020-06-21 ENCOUNTER — Telehealth (INDEPENDENT_AMBULATORY_CARE_PROVIDER_SITE_OTHER): Payer: Medicare Other | Admitting: Internal Medicine

## 2020-06-21 VITALS — Ht 66.0 in | Wt 200.0 lb

## 2020-06-21 DIAGNOSIS — D6869 Other thrombophilia: Secondary | ICD-10-CM

## 2020-06-21 DIAGNOSIS — I4819 Other persistent atrial fibrillation: Secondary | ICD-10-CM

## 2020-06-21 DIAGNOSIS — I440 Atrioventricular block, first degree: Secondary | ICD-10-CM | POA: Diagnosis not present

## 2020-06-21 MED ORDER — FLECAINIDE ACETATE 50 MG PO TABS
25.0000 mg | ORAL_TABLET | Freq: Two times a day (BID) | ORAL | 3 refills | Status: DC
Start: 2020-06-21 — End: 2021-09-09

## 2020-06-21 NOTE — Telephone Encounter (Signed)
Prescription updated.  appt made.
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
## 2020-06-21 NOTE — Telephone Encounter (Signed)
-----   Message from Thompson Grayer, MD sent at 06/21/2020  8:55 AM EDT ----- Reduce flecainide 25mg  BID  6 months in [facility] clinic

## 2020-07-02 ENCOUNTER — Other Ambulatory Visit: Payer: Self-pay

## 2020-07-02 ENCOUNTER — Ambulatory Visit
Admission: RE | Admit: 2020-07-02 | Discharge: 2020-07-02 | Disposition: A | Discharge status: Home / Self Care | Payer: Medicare Other | Source: Ambulatory Visit | Attending: Internal Medicine | Admitting: Internal Medicine

## 2020-07-02 DIAGNOSIS — Z1231 Encounter for screening mammogram for malignant neoplasm of breast: Secondary | ICD-10-CM | POA: Diagnosis not present

## 2020-07-06 ENCOUNTER — Ambulatory Visit (INDEPENDENT_AMBULATORY_CARE_PROVIDER_SITE_OTHER): Payer: Medicare Other | Admitting: Internal Medicine

## 2020-07-06 ENCOUNTER — Other Ambulatory Visit: Payer: Self-pay

## 2020-07-06 ENCOUNTER — Encounter: Payer: Self-pay | Admitting: Internal Medicine

## 2020-07-06 VITALS — BP 158/74 | HR 60 | Temp 97.8°F | Resp 16 | Ht 66.0 in | Wt 214.0 lb

## 2020-07-06 DIAGNOSIS — E559 Vitamin D deficiency, unspecified: Secondary | ICD-10-CM

## 2020-07-06 DIAGNOSIS — I4819 Other persistent atrial fibrillation: Secondary | ICD-10-CM | POA: Diagnosis not present

## 2020-07-06 DIAGNOSIS — E039 Hypothyroidism, unspecified: Secondary | ICD-10-CM | POA: Diagnosis not present

## 2020-07-06 DIAGNOSIS — E785 Hyperlipidemia, unspecified: Secondary | ICD-10-CM | POA: Diagnosis not present

## 2020-07-06 DIAGNOSIS — F5104 Psychophysiologic insomnia: Secondary | ICD-10-CM | POA: Diagnosis not present

## 2020-07-06 LAB — HM MAMMOGRAPHY

## 2020-07-06 MED ORDER — ZOLPIDEM TARTRATE 10 MG PO TABS: 10.0000 mg | ORAL_TABLET | Freq: Every evening | ORAL | 1 refills | Status: DC | PRN

## 2020-07-06 NOTE — Progress Notes (Signed)
Subjective:  Patient ID: [name], female    DOB: [birth year]  Age: 79 y.o. MRN: [id number]  CC: Annual Exam, Atrial Fibrillation, Hyperlipidemia, and Hypothyroidism  This visit occurred during the SARS-CoV-2 public health emergency.  Safety protocols were in place, including screening questions prior to the visit, additional usage of staff PPE, and extensive cleaning of exam room while observing appropriate contact time as indicated for disinfecting solutions.    HPI [name] presents for a CPX.  She walks about 15,000 steps a day.  She does not experience CP, DOE, diaphoresis, palpitations, edema, or fatigue.  She complains of weight gain.  She also complains of insomnia with frequent awakenings.  She tried Belsomra but it was not effective.  She would like a prescription for Ambien so that she can take 5 mg at bedtime.  Outpatient Medications Prior to Visit  Medication Sig Dispense Refill  . flecainide (TAMBOCOR) 50 MG tablet Take 0.5 tablets (25 mg total) by mouth 2 (two) times daily. 90 tablet 3  . metoprolol tartrate (LOPRESSOR) 25 MG tablet TAKE (1/2) TABLET TWICE DAILY. 90 tablet 3  . rosuvastatin (CRESTOR) 10 MG tablet TAKE 1 TABLET EACH DAY. 90 tablet 0  . XARELTO 20 MG TABS tablet TAKE 1 TABLET ONCE DAILY. 90 tablet 3  . levothyroxine (SYNTHROID) 50 MCG tablet TAKE 1/2 TABLET ONCE A DAY IN THE MORNING ON AN EMPTY STOMACH. 45 tablet 0  . FLUZONE HIGH-DOSE QUADRIVALENT 0.7 ML SUSY     . Suvorexant (BELSOMRA) 20 MG TABS Take 1 tablet by mouth at bedtime as needed. 30 tablet 5   No facility-administered medications prior to visit.    ROS Review of Systems  Constitutional: Positive for unexpected weight change (wt gain). Negative for appetite change, chills, diaphoresis and fatigue.  HENT: Negative.   Eyes: Negative.   Respiratory: Negative for cough, chest tightness, shortness of breath and wheezing.   Cardiovascular: Negative for chest pain,  palpitations and leg swelling.  Gastrointestinal: Negative for abdominal pain, constipation, diarrhea, nausea and vomiting.  Endocrine: Negative for cold intolerance and heat intolerance.  Genitourinary: Negative.  Negative for difficulty urinating, dysuria and hematuria.  Musculoskeletal: Negative for arthralgias and myalgias.  Skin: Negative.  Negative for color change and pallor.  Neurological: Negative.  Negative for dizziness, weakness, light-headedness and numbness.  Hematological: Negative for adenopathy. Does not bruise/bleed easily.  Psychiatric/Behavioral: Positive for sleep disturbance. Negative for behavioral problems, decreased concentration and dysphoric mood. The patient is not nervous/anxious.     Objective:  BP (!) 158/74 (BP Location: Left Arm, Patient Position: Sitting, Cuff Size: Large)   Pulse 60   Temp 97.8 F (36.6 C) (Oral)   Resp 16   Ht 5\' 6"  (1.676 m)   Wt 214 lb (97.1 kg)   SpO2 96%   BMI 34.54 kg/m   BP Readings from Last 3 Encounters:  07/06/20 (!) 158/74  02/03/20 (!) 154/80  01/19/20 130/82    Wt Readings from Last 3 Encounters:  07/06/20 214 lb (97.1 kg)  06/21/20 200 lb (90.7 kg)  02/03/20 212 lb 6.4 oz (96.3 kg)    Physical Exam Constitutional:      General: She is not in acute distress.    Appearance: She is obese. She is not toxic-appearing or diaphoretic.  HENT:     Nose: Nose normal.     Mouth/Throat:     Mouth: Mucous membranes are moist.  Eyes:     General: No scleral icterus.  Conjunctiva/sclera: Conjunctivae normal.  Cardiovascular:     Rate and Rhythm: Normal rate and regular rhythm.     Heart sounds: No murmur heard.   Pulmonary:     Effort: Pulmonary effort is normal.     Breath sounds: No stridor. No wheezing, rhonchi or rales.  Abdominal:     General: Abdomen is protuberant. Bowel sounds are normal. There is no distension.     Palpations: Abdomen is soft. There is no hepatomegaly, splenomegaly or mass.      Tenderness: There is no abdominal tenderness.  Musculoskeletal:        General: Normal range of motion.     Cervical back: Neck supple.     Right lower leg: No edema.     Left lower leg: No edema.  Lymphadenopathy:     Cervical: No cervical adenopathy.  Skin:    General: Skin is warm and dry.     Coloration: Skin is not pale.  Neurological:     General: No focal deficit present.     Mental Status: She is alert and oriented to person, place, and time. Mental status is at baseline.  Psychiatric:        Mood and Affect: Mood normal.        Behavior: Behavior normal.     Lab Results  Component Value Date   WBC 6.8 01/08/2020   HGB 13.6 01/08/2020   HCT 43.9 01/08/2020   PLT 285 01/08/2020   GLUCOSE 93 07/06/2020   CHOL 165 07/06/2020   TRIG 65 07/06/2020   HDL 70 07/06/2020   LDLDIRECT 127.1 07/08/2013   LDLCALC 81 07/06/2020   ALT 14 07/06/2020   AST 20 07/06/2020   NA 143 07/06/2020   K 4.1 07/06/2020   CL 107 07/06/2020   CREATININE 0.95 (H) 07/06/2020   BUN 28 (H) 07/06/2020   CO2 21 07/06/2020   TSH 2.91 07/06/2020   INR 1.4 (H) 07/03/2019   HGBA1C 5.5 03/13/2008   MICROALBUR 0.8 12/03/2006      Assessment & Plan:   Corvette was seen today for annual exam, atrial fibrillation, hyperlipidemia and hypothyroidism.  Diagnoses and all orders for this visit:  Acquired hypothyroidism- Her TSH is in the normal range.  She remain on the current dose of levothyroxine. -     TSH; Future -     TSH -     levothyroxine (SYNTHROID) 50 MCG tablet; TAKE 1/2 TABLET ONCE A DAY IN THE MORNING ON AN EMPTY STOMACH.  Dyslipidemia, goal LDL below 100- She has achieved her LDL goal and is doing well on the statin. -     Lipid panel; Future -     Hepatic function panel; Future -     Hepatic function panel -     Lipid panel  Psychophysiological insomnia- I will treat the vitamin D deficiency.  Will treat this with zolpidem. -     zolpidem (AMBIEN) 10 MG tablet; Take 1 tablet (10  mg total) by mouth at bedtime as needed for sleep.  Vitamin D deficiency -     VITAMIN D 25 Hydroxy (Vit-D Deficiency, Fractures); Future -     VITAMIN D 25 Hydroxy (Vit-D Deficiency, Fractures) -     Cholecalciferol 1.25 MG (50000 UT) capsule; Take 1 capsule (50,000 Units total) by mouth once a week.  Persistent atrial fibrillation ([place])- She has good rate and rhythm control.  Will continue anticoagulation with the DOAC. -     Basic metabolic panel; Future -  Basic metabolic panel   I have discontinued Royetta Crochet. [place] Belsomra and Fluzone High-Dose Quadrivalent. I am also having her start on zolpidem and Cholecalciferol. Additionally, I am having her maintain her metoprolol tartrate, Xarelto, rosuvastatin, flecainide, and levothyroxine.  Meds ordered this encounter  Medications  . zolpidem (AMBIEN) 10 MG tablet    Sig: Take 1 tablet (10 mg total) by mouth at bedtime as needed for sleep.    Dispense:  90 tablet    Refill:  1  . Cholecalciferol 1.25 MG (50000 UT) capsule    Sig: Take 1 capsule (50,000 Units total) by mouth once a week.    Dispense:  12 capsule    Refill:  1  . levothyroxine (SYNTHROID) 50 MCG tablet    Sig: TAKE 1/2 TABLET ONCE A DAY IN THE MORNING ON AN EMPTY STOMACH.    Dispense:  45 tablet    Refill:  1    I spent 50 minutes in preparing to see the patient by review of recent labs, imaging and procedures, obtaining and reviewing separately obtained history, communicating with the patient and family or caregiver, ordering medications, tests or procedures, and documenting clinical information in the EHR including the differential Dx, treatment, and any further evaluation and other management of 1. Acquired hypothyroidism 2. Dyslipidemia, goal LDL below 100 3. Psychophysiological insomnia 4. Vitamin D deficiency 5. Persistent atrial fibrillation ([place])     Follow-up: Return in about 6 months (around 01/06/2021).  Scarlette Calico, MD

## 2020-07-06 NOTE — Patient Instructions (Signed)
Health Maintenance, Female Adopting a healthy lifestyle and getting preventive care are important in promoting health and wellness. Ask your health care provider about:  The right schedule for you to have regular tests and exams.  Things you can do on your own to prevent diseases and keep yourself healthy. What should I know about diet, weight, and exercise? Eat a healthy diet   Eat a diet that includes plenty of vegetables, fruits, low-fat dairy products, and lean protein.  Do not eat a lot of foods that are high in solid fats, added sugars, or sodium. Maintain a healthy weight Body mass index (BMI) is used to identify weight problems. It estimates body fat based on height and weight. Your health care provider can help determine your BMI and help you achieve or maintain a healthy weight. Get regular exercise Get regular exercise. This is one of the most important things you can do for your health. Most adults should:  Exercise for at least 150 minutes each week. The exercise should increase your heart rate and make you sweat (moderate-intensity exercise).  Do strengthening exercises at least twice a week. This is in addition to the moderate-intensity exercise.  Spend less time sitting. Even light physical activity can be beneficial. Watch cholesterol and blood lipids Have your blood tested for lipids and cholesterol at 80 years of age, then have this test every 5 years. Have your cholesterol levels checked more often if:  Your lipid or cholesterol levels are high.  You are older than 80 years of age.  You are at high risk for heart disease. What should I know about cancer screening? Depending on your health history and family history, you may need to have cancer screening at various ages. This may include screening for:  Breast cancer.  Cervical cancer.  Colorectal cancer.  Skin cancer.  Lung cancer. What should I know about heart disease, diabetes, and high blood  pressure? Blood pressure and heart disease  High blood pressure causes heart disease and increases the risk of stroke. This is more likely to develop in people who have high blood pressure readings, are of African descent, or are overweight.  Have your blood pressure checked: ? Every 3-5 years if you are 18-39 years of age. ? Every year if you are 40 years old or older. Diabetes Have regular diabetes screenings. This checks your fasting blood sugar level. Have the screening done:  Once every three years after age 40 if you are at a normal weight and have a low risk for diabetes.  More often and at a younger age if you are overweight or have a high risk for diabetes. What should I know about preventing infection? Hepatitis B If you have a higher risk for hepatitis B, you should be screened for this virus. Talk with your health care provider to find out if you are at risk for hepatitis B infection. Hepatitis C Testing is recommended for:  Everyone born from 1945 through 1965.  Anyone with known risk factors for hepatitis C. Sexually transmitted infections (STIs)  Get screened for STIs, including gonorrhea and chlamydia, if: ? You are sexually active and are younger than 80 years of age. ? You are older than 80 years of age and your health care provider tells you that you are at risk for this type of infection. ? Your sexual activity has changed since you were last screened, and you are at increased risk for chlamydia or gonorrhea. Ask your health care provider if   you are at risk.  Ask your health care provider about whether you are at high risk for HIV. Your health care provider may recommend a prescription medicine to help prevent HIV infection. If you choose to take medicine to prevent HIV, you should first get tested for HIV. You should then be tested every 3 months for as long as you are taking the medicine. Pregnancy  If you are about to stop having your period (premenopausal) and  you may become pregnant, seek counseling before you get pregnant.  Take 400 to 800 micrograms (mcg) of folic acid every day if you become pregnant.  Ask for birth control (contraception) if you want to prevent pregnancy. Osteoporosis and menopause Osteoporosis is a disease in which the bones lose minerals and strength with aging. This can result in bone fractures. If you are 65 years old or older, or if you are at risk for osteoporosis and fractures, ask your health care provider if you should:  Be screened for bone loss.  Take a calcium or vitamin D supplement to lower your risk of fractures.  Be given hormone replacement therapy (HRT) to treat symptoms of menopause. Follow these instructions at home: Lifestyle  Do not use any products that contain nicotine or tobacco, such as cigarettes, e-cigarettes, and chewing tobacco. If you need help quitting, ask your health care provider.  Do not use street drugs.  Do not share needles.  Ask your health care provider for help if you need support or information about quitting drugs. Alcohol use  Do not drink alcohol if: ? Your health care provider tells you not to drink. ? You are pregnant, may be pregnant, or are planning to become pregnant.  If you drink alcohol: ? Limit how much you use to 0-1 drink a day. ? Limit intake if you are breastfeeding.  Be aware of how much alcohol is in your drink. In the [place] one drink equals one 12 oz bottle of beer (355 mL), one 5 oz glass of wine (148 mL), or one 1 oz glass of hard liquor (44 mL). General instructions  Schedule regular health, dental, and eye exams.  Stay current with your vaccines.  Tell your health care provider if: ? You often feel depressed. ? You have ever been abused or do not feel safe at home. Summary  Adopting a healthy lifestyle and getting preventive care are important in promoting health and wellness.  Follow your health care provider's instructions about healthy  diet, exercising, and getting tested or screened for diseases.  Follow your health care provider's instructions on monitoring your cholesterol and blood pressure. This information is not intended to replace advice given to you by your health care provider. Make sure you discuss any questions you have with your health care provider. Document Revised: 12/04/2018 Document Reviewed: 12/04/2018 Elsevier Patient Education  2020 Elsevier Inc.  

## 2020-07-07 LAB — LIPID PANEL
Cholesterol: 165 mg/dL (ref <200)
HDL: 70 mg/dL (ref >=50)
LDL Cholesterol (Calc): 81 mg/dL (calc)
Non-HDL Cholesterol (Calc): 95 mg/dL (calc) (ref <130)
Total CHOL/HDL Ratio: 2.4 (calc) (ref <5.0)
Triglycerides: 65 mg/dL (ref <150)

## 2020-07-07 LAB — VITAMIN D 25 HYDROXY (VIT D DEFICIENCY, FRACTURES): Vit D, 25-Hydroxy: 13 ng/mL — ABNORMAL LOW (ref 30–100)

## 2020-07-07 LAB — HEPATIC FUNCTION PANEL
AG Ratio: 1.6 (calc) (ref 1.0–2.5)
ALT: 14 U/L (ref 6–29)
AST: 20 U/L (ref 10–35)
Albumin: 4.4 g/dL (ref 3.6–5.1)
Alkaline phosphatase (APISO): 68 U/L (ref 37–153)
Bilirubin, Direct: 0.1 mg/dL (ref 0.0–0.2)
Globulin: 2.7 g/dL (calc) (ref 1.9–3.7)
Indirect Bilirubin: 0.3 mg/dL (calc) (ref 0.2–1.2)
Total Bilirubin: 0.4 mg/dL (ref 0.2–1.2)
Total Protein: 7.1 g/dL (ref 6.1–8.1)

## 2020-07-07 LAB — TSH: TSH: 2.91 mIU/L (ref 0.40–4.50)

## 2020-07-07 LAB — BASIC METABOLIC PANEL
BUN/Creatinine Ratio: 29 (calc) — ABNORMAL HIGH (ref 6–22)
BUN: 28 mg/dL — ABNORMAL HIGH (ref 7–25)
CO2: 21 mmol/L (ref 20–32)
Calcium: 9.7 mg/dL (ref 8.6–10.4)
Chloride: 107 mmol/L (ref 98–110)
Creat: 0.95 mg/dL — ABNORMAL HIGH (ref 0.60–0.88)
Glucose, Bld: 93 mg/dL (ref 65–99)
Potassium: 4.1 mmol/L (ref 3.5–5.3)
Sodium: 143 mmol/L (ref 135–146)

## 2020-07-07 MED ORDER — LEVOTHYROXINE SODIUM 50 MCG PO TABS: ORAL_TABLET | ORAL | 1 refills | Status: DC

## 2020-07-07 MED ORDER — CHOLECALCIFEROL 1.25 MG (50000 UT) PO CAPS: 50000.0000 [IU] | ORAL_CAPSULE | ORAL | 1 refills | Status: DC

## 2020-07-26 ENCOUNTER — Ambulatory Visit (INDEPENDENT_AMBULATORY_CARE_PROVIDER_SITE_OTHER): Payer: Medicare Other

## 2020-07-26 DIAGNOSIS — Z0000 Encounter for general adult medical examination without abnormal findings: Secondary | ICD-10-CM | POA: Diagnosis not present

## 2020-07-26 NOTE — Patient Instructions (Addendum)
Ms. Fetterly , Thank you for taking time to come for your Medicare Wellness Visit. I appreciate your ongoing commitment to your health goals. Please review the following plan we discussed and let me know if I can assist you in the future.   Screening recommendations/referrals: Colonoscopy: 04/16/2017; every 5 years; may not have to repeat due to age Mammogram: 07/06/2020; every year Bone Density: not a candidate for screening Recommended yearly ophthalmology/optometry visit for glaucoma screening and checkup Recommended yearly dental visit for hygiene and checkup  Vaccinations: Influenza vaccine: 10/24/2019 Pneumococcal vaccine: completed Tdap vaccine: 07/03/2019 Shingles vaccine: never done; will check with local pharmacy   Covid-19: completed  Advanced directives: Please bring a copy of your health care power of attorney and living will to the office at your convenience.  Conditions/risks identified: Yes; Please continue to do your personal lifestyle choices by: daily care of teeth and gums, regular physical activity (goal should be 5 days a week for 30 minutes), eat a healthy diet, avoid tobacco and drug use, limiting any alcohol intake, taking a low-dose aspirin (if not allergic or have been advised by your provider otherwise) and taking vitamins and minerals as recommended by your provider. Continue doing brain stimulating activities (puzzles, reading, adult coloring books, staying active) to keep memory sharp. Continue to eat heart healthy diet (full of fruits, vegetables, whole grains, lean protein, water--limit salt, fat, and sugar intake) and increase physical activity as tolerated.  Next appointment: Please schedule your next Medicare Wellness Visit with your Nurse Health Advisor in 1 year.    Preventive Care 46 Years and Older, Female Preventive care refers to lifestyle choices and visits with your health care provider that can promote health and wellness. What does preventive care  include?  A yearly physical exam. This is also called an annual well check.  Dental exams once or twice a year.  Routine eye exams. Ask your health care provider how often you should have your eyes checked.  Personal lifestyle choices, including:  Daily care of your teeth and gums.  Regular physical activity.  Eating a healthy diet.  Avoiding tobacco and drug use.  Limiting alcohol use.  Practicing safe sex.  Taking low-dose aspirin every day.  Taking vitamin and mineral supplements as recommended by your health care provider. What happens during an annual well check? The services and screenings done by your health care provider during your annual well check will depend on your age, overall health, lifestyle risk factors, and family history of disease. Counseling  Your health care provider may ask you questions about your:  Alcohol use.  Tobacco use.  Drug use.  Emotional well-being.  Home and relationship well-being.  Sexual activity.  Eating habits.  History of falls.  Memory and ability to understand (cognition).  Work and work Statistician.  Reproductive health. Screening  You may have the following tests or measurements:  Height, weight, and BMI.  Blood pressure.  Lipid and cholesterol levels. These may be checked every 5 years, or more frequently if you are over 26 years old.  Skin check.  Lung cancer screening. You may have this screening every year starting at age 20 if you have a 30-pack-year history of smoking and currently smoke or have quit within the past 15 years.  Fecal occult blood test (FOBT) of the stool. You may have this test every year starting at age 33.  Flexible sigmoidoscopy or colonoscopy. You may have a sigmoidoscopy every 5 years or a colonoscopy every 10 years  starting at age 29.  Hepatitis C blood test.  Hepatitis B blood test.  Sexually transmitted disease (STD) testing.  Diabetes screening. This is done by  checking your blood sugar (glucose) after you have not eaten for a while (fasting). You may have this done every 1-3 years.  Bone density scan. This is done to screen for osteoporosis. You may have this done starting at age 11.  Mammogram. This may be done every 1-2 years. Talk to your health care provider about how often you should have regular mammograms. Talk with your health care provider about your test results, treatment options, and if necessary, the need for more tests. Vaccines  Your health care provider may recommend certain vaccines, such as:  Influenza vaccine. This is recommended every year.  Tetanus, diphtheria, and acellular pertussis (Tdap, Td) vaccine. You may need a Td booster every 10 years.  Zoster vaccine. You may need this after age 61.  Pneumococcal 13-valent conjugate (PCV13) vaccine. One dose is recommended after age 86.  Pneumococcal polysaccharide (PPSV23) vaccine. One dose is recommended after age 55. Talk to your health care provider about which screenings and vaccines you need and how often you need them. This information is not intended to replace advice given to you by your health care provider. Make sure you discuss any questions you have with your health care provider. Document Released: 01/07/2016 Document Revised: 08/30/2016 Document Reviewed: 10/12/2015 Elsevier Interactive Patient Education  2017 [employer] Prevention in the Home Falls can cause injuries. They can happen to people of all ages. There are many things you can do to make your home safe and to help prevent falls. What can I do on the outside of my home?  Regularly fix the edges of walkways and driveways and fix any cracks.  Remove anything that might make you trip as you walk through a door, such as a raised step or threshold.  Trim any bushes or trees on the path to your home.  Use bright outdoor lighting.  Clear any walking paths of anything that might make someone trip,  such as rocks or tools.  Regularly check to see if handrails are loose or broken. Make sure that both sides of any steps have handrails.  Any raised decks and porches should have guardrails on the edges.  Have any leaves, snow, or ice cleared regularly.  Use sand or salt on walking paths during winter.  Clean up any spills in your garage right away. This includes oil or grease spills. What can I do in the bathroom?  Use night lights.  Install grab bars by the toilet and in the tub and shower. Do not use towel bars as grab bars.  Use non-skid mats or decals in the tub or shower.  If you need to sit down in the shower, use a plastic, non-slip stool.  Keep the floor dry. Clean up any water that spills on the floor as soon as it happens.  Remove soap buildup in the tub or shower regularly.  Attach bath mats securely with double-sided non-slip rug tape.  Do not have throw rugs and other things on the floor that can make you trip. What can I do in the bedroom?  Use night lights.  Make sure that you have a light by your bed that is easy to reach.  Do not use any sheets or blankets that are too big for your bed. They should not hang down onto the floor.  Have a  firm chair that has side arms. You can use this for support while you get dressed.  Do not have throw rugs and other things on the floor that can make you trip. What can I do in the kitchen?  Clean up any spills right away.  Avoid walking on wet floors.  Keep items that you use a lot in easy-to-reach places.  If you need to reach something above you, use a strong step stool that has a grab bar.  Keep electrical cords out of the way.  Do not use floor polish or wax that makes floors slippery. If you must use wax, use non-skid floor wax.  Do not have throw rugs and other things on the floor that can make you trip. What can I do with my stairs?  Do not leave any items on the stairs.  Make sure that there are  handrails on both sides of the stairs and use them. Fix handrails that are broken or loose. Make sure that handrails are as long as the stairways.  Check any carpeting to make sure that it is firmly attached to the stairs. Fix any carpet that is loose or worn.  Avoid having throw rugs at the top or bottom of the stairs. If you do have throw rugs, attach them to the floor with carpet tape.  Make sure that you have a light switch at the top of the stairs and the bottom of the stairs. If you do not have them, ask someone to add them for you. What else can I do to help prevent falls?  Wear shoes that:  Do not have high heels.  Have rubber bottoms.  Are comfortable and fit you well.  Are closed at the toe. Do not wear sandals.  If you use a stepladder:  Make sure that it is fully opened. Do not climb a closed stepladder.  Make sure that both sides of the stepladder are locked into place.  Ask someone to hold it for you, if possible.  Clearly mark and make sure that you can see:  Any grab bars or handrails.  First and last steps.  Where the edge of each step is.  Use tools that help you move around (mobility aids) if they are needed. These include:  Canes.  Walkers.  Scooters.  Crutches.  Turn on the lights when you go into a dark area. Replace any light bulbs as soon as they burn out.  Set up your furniture so you have a clear path. Avoid moving your furniture around.  If any of your floors are uneven, fix them.  If there are any pets around you, be aware of where they are.  Review your medicines with your doctor. Some medicines can make you feel dizzy. This can increase your chance of falling. Ask your doctor what other things that you can do to help prevent falls. This information is not intended to replace advice given to you by your health care provider. Make sure you discuss any questions you have with your health care provider. Document Released: 10/07/2009  Document Revised: 05/18/2016 Document Reviewed: 01/15/2015 Elsevier Interactive Patient Education  2017 [employer].
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
## 2020-08-20 IMAGING — CT CT HEART MORP W/ CTA COR W/ SCORE W/ CA W/CM &/OR W/O CM
4 of 7 series · 8 of 20 positions shown, 9 images · IV contrast (APPLIED)
Comparison: None.
COMPARISON: None.

Addendum:
EXAM:
OVER-READ INTERPRETATION  CT CHEST

The following report is an over-read performed by radiologist Dr.
Nemesi Trac [REDACTED] on 09/19/2019. This
over-read does not include interpretation of cardiac or coronary
anatomy or pathology. The coronary calcium score/coronary CTA
interpretation by the cardiologist is attached.
CLINICAL DATA: Dyspnea
Cardiac CTA
MEDICATIONS:
Sub lingual nitro. 4mg x 2
TECHNIQUE: The patient was scanned on a Siemens [REDACTED]ice scanner. Gantry
rotation speed was 250 msecs. Collimation was 0.6 mm. A 100 kV
prospective scan was triggered in the ascending thoracic aorta at
35-75% of the R-R interval. Average HR during the scan was 60 bpm.
The 3D data set was interpreted on a dedicated work station using
MPR, MIP and VRT modes. A total of 80cc of contrast was used.

[Series 6: best diast 74 % · axial · 0.39mm/px · z∈[-202,-156]mm · 2 of 346 slices shown]
[im 116/346  vessel]
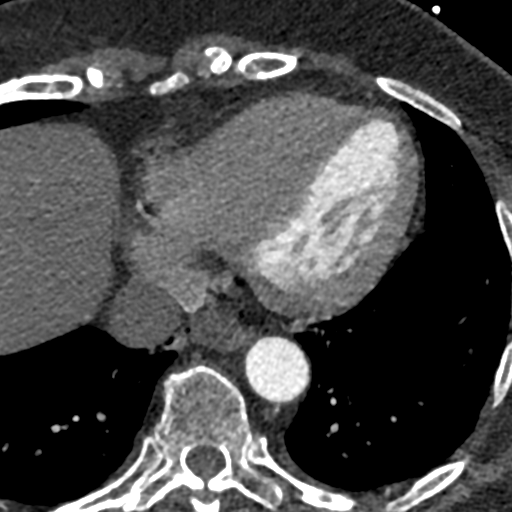
[im 231/346  vessel]
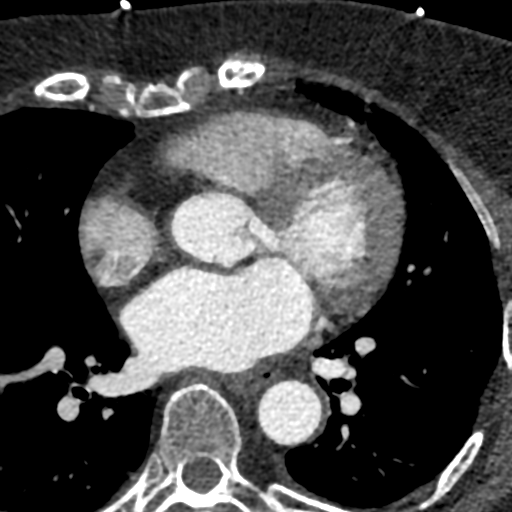

[Series 7: best syst · axial · 0.39mm/px · z∈[-202,-156]mm · 2 of 346 slices shown, 3 images]
[im 116/346  vessel]
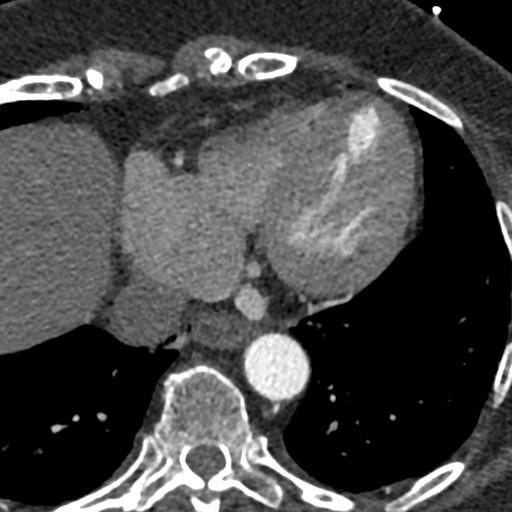
[im 116/346  lung]
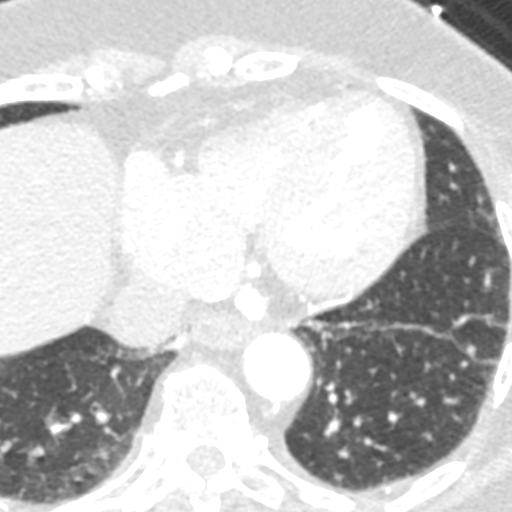
[im 231/346  vessel]
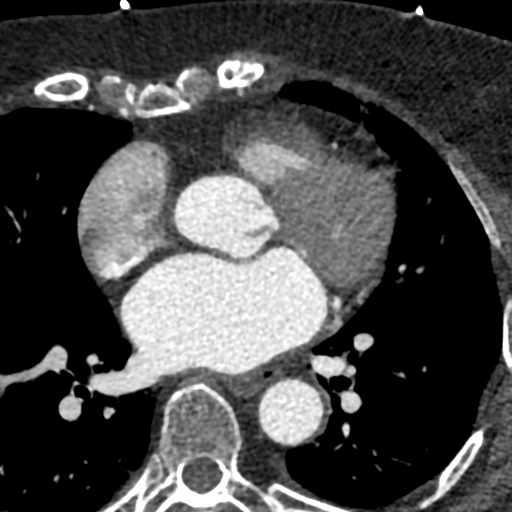

[Series 8: ts diast sharp 74 % · axial · 0.39mm/px · z∈[-202,-156]mm · 2 of 346 slices shown]
[im 116/346  lung]
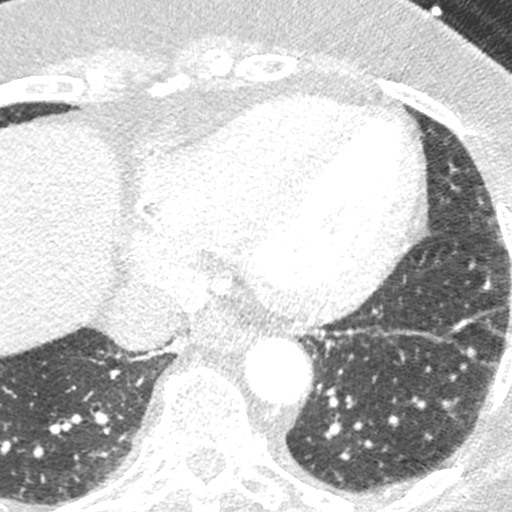
[im 231/346  lung]
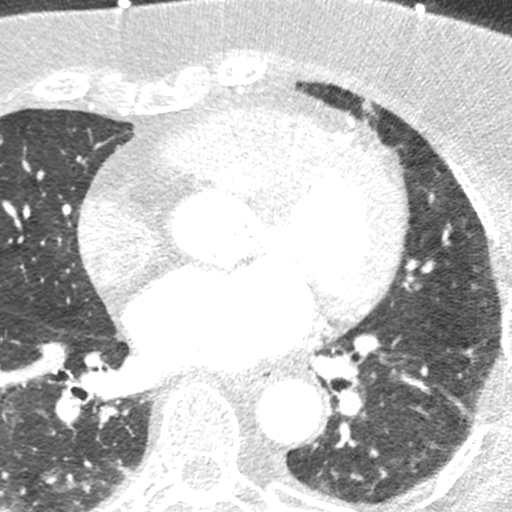

[Series 9: ts syst sharp · axial · 0.39mm/px · z∈[-202,-156]mm · 2 of 346 slices shown]
[im 116/346  lung]
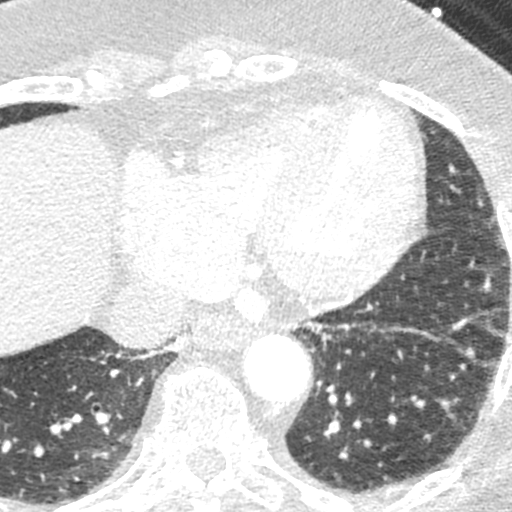
[im 231/346  lung]
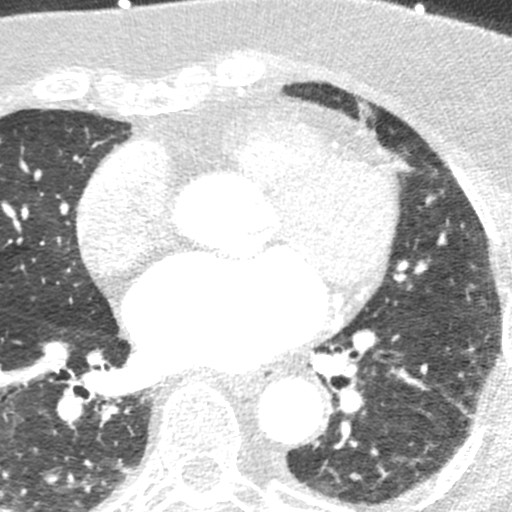

[8 of 20 positions shown; findings below may reference images not displayed]

FINDINGS: Within the visualized portions of the thorax there are no suspicious
appearing pulmonary nodules or masses, there is no acute
consolidative airspace disease, no pleural effusions, no
pneumothorax and no lymphadenopathy. Visualized portions of the
upper abdomen are unremarkable. There are no aggressive appearing
lytic or blastic lesions noted in the visualized portions of the
skeleton. Multiple old healed right-sided rib fractures.
IMPRESSION: No significant incidental noncardiac findings are noted.
FINDINGS: Non-cardiac: See separate report from [REDACTED].

Pulmonary veins drained normally to the left atrium.

Calcium Score: 0.4 Agatston units.

Coronary Arteries: Right dominant with no anomalies

LM: Short, no plaque or stenosis.

LAD system: Mixed plaque proximal LAD, no significant stenosis.

Circumflex system: No plaque or stenosis.

RCA system: No plaque or stenosis.
IMPRESSION: 1. Coronary artery calcium score 0.4 Agatston units. This places the
patient in the 15th percentile for age and gender, suggesting low
risk for future cardiac events.

2.  No obstructive coronary disease.

Dicap Leka

*** End of Addendum ***
EXAM:
OVER-READ INTERPRETATION  CT CHEST

The following report is an over-read performed by radiologist Dr.
Nemesi Trac [REDACTED] on 09/19/2019. This
over-read does not include interpretation of cardiac or coronary
anatomy or pathology. The coronary calcium score/coronary CTA
interpretation by the cardiologist is attached.
FINDINGS: Within the visualized portions of the thorax there are no suspicious
appearing pulmonary nodules or masses, there is no acute
consolidative airspace disease, no pleural effusions, no
pneumothorax and no lymphadenopathy. Visualized portions of the
upper abdomen are unremarkable. There are no aggressive appearing
lytic or blastic lesions noted in the visualized portions of the
skeleton. Multiple old healed right-sided rib fractures.
IMPRESSION: No significant incidental noncardiac findings are noted.

## 2020-09-06 ENCOUNTER — Other Ambulatory Visit (HOSPITAL_COMMUNITY): Payer: Self-pay | Admitting: Internal Medicine

## 2020-09-06 ENCOUNTER — Other Ambulatory Visit: Payer: Self-pay | Admitting: Internal Medicine

## 2020-09-06 DIAGNOSIS — E785 Hyperlipidemia, unspecified: Secondary | ICD-10-CM

## 2020-11-05 ENCOUNTER — Telehealth: Payer: Self-pay | Admitting: Pharmacist

## 2020-11-05 NOTE — Progress Notes (Signed)
    Chronic Care Management Pharmacy Assistant   Name: [name]  MRN: [id number] DOB: Nov 11, 1941  Reason for Encounter: General Adherence Call   PCP : Janith Lima, MD  Allergies:   Allergies  Allergen Reactions  . Meperidine Hcl Rash  . Penicillins Rash    Rash as child    Medications: Outpatient Encounter Medications as of 11/05/2020  Medication Sig  . Cholecalciferol 1.25 MG (50000 UT) capsule Take 1 capsule (50,000 Units total) by mouth once a week.  . flecainide (TAMBOCOR) 50 MG tablet Take 0.5 tablets (25 mg total) by mouth 2 (two) times daily.  Marland Kitchen levothyroxine (SYNTHROID) 50 MCG tablet TAKE 1/2 TABLET ONCE A DAY IN THE MORNING ON AN EMPTY STOMACH.  . metoprolol tartrate (LOPRESSOR) 25 MG tablet TAKE (1/2) TABLET TWICE DAILY.  . rosuvastatin (CRESTOR) 10 MG tablet TAKE 1 TABLET EACH DAY.  Marland Kitchen XARELTO 20 MG TABS tablet TAKE 1 TABLET ONCE DAILY.  Marland Kitchen zolpidem (AMBIEN) 10 MG tablet Take 1 tablet (10 mg total) by mouth at bedtime as needed for sleep.   No facility-administered encounter medications on file as of 11/05/2020.    Current Diagnosis: Patient Active Problem List   Diagnosis Date Noted  . Secondary hypercoagulable state ([place]) 02/03/2020  . Psychophysiological insomnia 08/19/2019  . Iron deficiency anemia secondary to inadequate dietary iron intake 12/30/2018  . Atrial fibrillation with RVR ([place]) 10/31/2016  . History of pulmonary embolus (PE) 09/21/2016  . Routine general medical examination at a health care facility 07/16/2016  . OAB (overactive bladder) 07/13/2016  . Estrogen deficiency 07/13/2016  . Atrial flutter, paroxysmal ([place]) 12/15/2014  . Obesity (BMI 30-39.9) 07/09/2014  . Hypothyroidism 09/01/2013  . Persistent atrial fibrillation ([place]) 11/04/2012  . Anticoagulation management encounter 10/25/2012  . Vitamin D deficiency 03/02/2009  . Dyslipidemia, goal LDL below 100 03/13/2008  . DEGENERATIVE JOINT DISEASE, ADVANCED 03/13/2008     Goals Addressed   None     Follow-Up:  Pharmacist Review   Spoke with patient to see how she was doing since her initial call with Clinical Pharmacist Mendel Ryder. Patient states that she is doing well, and that she is not having any issues at this time. Patient states that she is trying to loose weight eating a healthier diet and walking as part of her exercise. She is not having any complications with her medications or having any symptoms, she states that over all she is doing well.   Wendy Poet, Clinical Pharmacist Assistant [facility]

## 2020-11-23 NOTE — Telephone Encounter (Signed)
Reviewed chart and insurance data for medication adherence. Patient does not have any gaps in adherence and is not in danger of failing any Medicare adherence measures. No further action required. ° °

## 2020-11-29 ENCOUNTER — Ambulatory Visit: Payer: Medicare Other | Admitting: Pharmacist

## 2020-11-29 ENCOUNTER — Other Ambulatory Visit: Payer: Self-pay

## 2020-11-29 DIAGNOSIS — F5104 Psychophysiologic insomnia: Secondary | ICD-10-CM

## 2020-11-29 DIAGNOSIS — E785 Hyperlipidemia, unspecified: Secondary | ICD-10-CM

## 2020-11-29 DIAGNOSIS — I4819 Other persistent atrial fibrillation: Secondary | ICD-10-CM

## 2020-11-29 NOTE — Chronic Care Management (AMB) (Signed)
Chronic Care Management Pharmacy  Name: [name]  MRN: [id number] DOB: 03/01/1941  Chief Complaint/ HPI  [name],  79 y.o. , female presents for their Follow-Up CCM visit with the clinical pharmacist via telephone.  PCP : Janith Lima, MD  Their chronic conditions include: Atrial fibrillation, history of PE, Dyslipidemia, iron deficiency anemia, overactive bladder, hypothyroidism.  Patient lives with husband of 50+ years  Office Visits: 07/06/20 Dr [name] OV: rx'd Vitamin D 50,000 IU and zolpidem 10 mg  01/05/20 Dr [name]: c/o insomnia, tried zolpidem, [facility] copay was $100 so she did not try it. Pt agreed to try 1/2 tablet to alleviate cost concerns.   Consult Visits:  01/19/20 Dr Rayann Heman (cardiology): reduced flecainide to 50 mg BID due to age, HR < 60. F/u 2 weeks, may consider further reduction to 25 mg BID if she remains in NSR. Pt wants to avoid PPM.   01/08/20 Dr Haroldine Laws (cardiology): conduction disease progression, referred to Dr Rayann Heman (EP). No med changes.  Allergies  Allergen Reactions  . Meperidine Hcl Rash  . Penicillins Rash    Rash as child   Medications: Outpatient Encounter Medications as of 11/29/2020  Medication Sig  . Cholecalciferol 1.25 MG (50000 UT) capsule Take 1 capsule (50,000 Units total) by mouth once a week.  . flecainide (TAMBOCOR) 50 MG tablet Take 0.5 tablets (25 mg total) by mouth 2 (two) times daily.  Marland Kitchen levothyroxine (SYNTHROID) 50 MCG tablet TAKE 1/2 TABLET ONCE A DAY IN THE MORNING ON AN EMPTY STOMACH.  . metoprolol tartrate (LOPRESSOR) 25 MG tablet TAKE (1/2) TABLET TWICE DAILY.  . rosuvastatin (CRESTOR) 10 MG tablet TAKE 1 TABLET EACH DAY.  Marland Kitchen XARELTO 20 MG TABS tablet TAKE 1 TABLET ONCE DAILY.  Marland Kitchen zolpidem (AMBIEN) 10 MG tablet Take 1 tablet (10 mg total) by mouth at bedtime as needed for sleep.   No facility-administered encounter medications on file as of 11/29/2020.     Current Diagnosis/Assessment:      Goals Addressed            This Visit's Progress   . Pharmacy Care Plan       CARE PLAN ENTRY  Current Barriers:  . Chronic Disease Management support, education, and care coordination needs related to Hypertension, Hyperlipidemia, Atrial Fibrillation, and Insomnia   Hypertension BP Readings from Last 3 Encounters:  02/03/20 (!) 154/80  01/19/20 130/82  01/08/20 130/72 .  Pharmacist Clinical Goal(s): o Over the next 180 days, patient will work with PharmD and providers to maintain BP goal <140/90 . Current regimen:  o Metoprolol tartrate 12.5 mg (1/2 of 50 mg) twice a day . Interventions: o Discussed BP goals and benefits of medication, diet, and exercise to reduce cardiovascular risk . Patient self care activities - Over the next 180 days, patient will: o Check BP 1-2 times per week, document, and provide at future appointments o Ensure daily salt intake < 2300 mg/day  Hyperlipidemia Lab Results  Component Value Date   LDLDIRECT 127.1 07/08/2013   LDLCALC 76 07/03/2019   LDLCALC 133 (H) 07/14/2015 .  Pharmacist Clinical Goal(s): o Over the next 180 days, patient will work with PharmD and providers to maintain LDL goal < 100 . Current regimen:  o Rosuvastatin 10 mg daily . Interventions: o Discussed cholesterol goals and benefits of statin, diet and exercise to reduce cardiovascular risk . Patient self care activities - Over the next 180 days, patient will: o Continue medication as prescribed o  Continue low cholesterol diet and exercise routine  Atrial Fibrillation Pulse Readings from Last 3 Encounters:  02/03/20 (!) 55  01/19/20 (!) 58  01/08/20 64 .  Pharmacist Clinical Goal(s) o Over the next 180 days, patient will work with PharmD and providers to optimize therapy . Current regimen:  o flecainide 50 mg twice a day o metoprolol tartrate 12.5 mg (1/2 of 25 mg) twice a day o Xarelto 20 mg daily . Interventions: o Discussed benefits of Xarelto for  lifelong reduction in stroke risk and blood clots . Patient self care activities - Over the next 180 days, patient will: o Continue medications as prescribed o Monitor for signs and symptoms of bleeding  Insomnia . Pharmacist Clinical Goal(s) o Over the next 180 days, patient will work with PharmD and providers to improve sleep  . Current regimen:  o Zolpidem 10 mg at bedtime as needed o Melatonin 10 mg at bedtime . Interventions: o Discussed calming sleep routine before bedtime . Patient self care activities - Over the next 180 days, patient will: o Continue medications as prescribed o Follow up with PCP as needed  Medication management . Pharmacist Clinical Goal(s): o Over the next 180 days, patient will work with PharmD and providers to maintain optimal medication adherence . Current pharmacy: [facility] . Interventions o Comprehensive medication review performed. o Continue current medication management strategy . Patient self care activities - Over the next 180 days, patient will: o Focus on medication adherence by pill box o Take medications as prescribed o Report any questions or concerns to PharmD and/or provider(s)  Please see past updates related to this goal by clicking on the "Past Updates" button in the selected goal         AFIB   Patient is currently rate controlled. Pulse Readings from Last 3 Encounters:  07/06/20 60  02/03/20 (!) 55  01/19/20 (!) 58   CHA2DS2-VASc Score = 4 The patient's score is based upon: CHF History: 0 HTN History: 1 Diabetes History: 0 Stroke History: 0 Vascular Disease History: 0 Age Score: 2 Gender Score: 1  Patient has failed these meds in past: amiodarone, diltiazem Patient is currently controlled on the following medications:   flecainide 50 mg BID,   metoprolol tartrate 12.5 mg (1/2 of 25 mg) BID,   Xarelto 20 mg daily  We discussed: adherence with rate/rhythm control medications and need for lifelong  anticoagulation given her history of multiple VTE and Afib. Pt denies ever having issues with bleeding.  Plan  Continue current medications    Hypertension   BP goal < 140/90  Office blood pressures are  BP Readings from Last 3 Encounters:  07/06/20 (!) 158/74  02/03/20 (!) 154/80  01/19/20 130/82   Lab Results  Component Value Date   CREATININE 0.95 (H) 07/06/2020   BUN 28 (H) 07/06/2020   GFR 59.62 (L) 07/03/2019   GFRNONAA >60 01/08/2020   GFRAA >60 01/08/2020   NA 143 07/06/2020   K 4.1 07/06/2020   CALCIUM 9.7 07/06/2020   CO2 21 07/06/2020   Patient checks BP at home several times per month  Patient home BP readings are ranging: 130s/80s  Patient has failed these meds in the past: n/a Patient is currently controlled on:   metoprolol tartrate 12.5 mg BID  We discussed: diet and exercise. Pt goes to gym and aerobics classes 3 days a week.   Plan  Continue current medications and control with diet and exercise  Hyperlipidemia   LDL goal < 100  Last lipids Lab Results  Component Value Date   CHOL 165 07/06/2020   HDL 70 07/06/2020   LDLCALC 81 07/06/2020   LDLDIRECT 127.1 07/08/2013   TRIG 65 07/06/2020   CHOLHDL 2.4 07/06/2020   Hepatic Function Latest Ref Rng & Units 07/06/2020 08/29/2019 09/04/2018  Total Protein 6.1 - 8.1 g/dL 7.1 7.2 7.3  Albumin 3.5 - 5.0 g/dL - 4.0 4.1  AST 10 - 35 U/L 20 20 29   ALT 6 - 29 U/L 14 17 16   Alk Phosphatase 38 - 126 U/L - 71 63  Total Bilirubin 0.2 - 1.2 mg/dL 0.4 0.6 0.8  Bilirubin, Direct 0.0 - 0.2 mg/dL 0.1 - -   The ASCVD Risk score ([place]., et al., 2013) failed to calculate for the following reasons:   The 2013 ASCVD risk score is only valid for ages 30 to 81  Patient has failed these meds in past: n/a Patient is currently controlled on the following medications:   rosuvastatin 10 mg daily  We discussed:  diet and exercise, pt is quite active. She reports adherence to statin and denies any  issues.   Plan  Continue current medications   Insomnia   Patient has failed these meds in past: zolpidem Patient is currently controlled on the following medications: s  Zolpidem 10 mg HS  Melatonin 10 mg HS  We discussed: Patient is satisfied with current regimen and denies issues  Plan  Continue current medications   Vitamin D deficiency   Lab Results  Component Value Date/Time   VD25OH 13 (L) 07/06/2020 09:47 AM   VD25OH 27.39 (L) 07/03/2019 10:56 AM   VD25OH 36.15 07/18/2018 09:24 AM   Patient has failed these meds in past: n/a Patient is currently controlled on the following medications:  Marland Kitchen Vitamin D 50,000 IU weekly  We discussed:  Started replacement therapy in July  Plan  Continue current medications   Medication Management   Pt uses [facility] for all medications Uses pill box? Yes Pt endorses 100% compliance  We discussed: Pt is very comfortably managing her medications, she knows what each med is for and how to take it. Pt and her husband go to [place] for 3 months each winter, and coordinate with [facility] ahead of time to get adequate supply of meds.  Plan  Continue current medication management strategy    Follow up: 6 month phone visit  Charlene [name], PharmD, [facility] Clinical Pharmacist [facility] at [facility] [phone]

## 2020-11-29 NOTE — Patient Instructions (Signed)
Visit Information  Phone number for Pharmacist: [phone]  Goals Addressed            This Visit's Progress   . Pharmacy Care Plan       CARE PLAN ENTRY  Current Barriers:  . Chronic Disease Management support, education, and care coordination needs related to Hypertension, Hyperlipidemia, Atrial Fibrillation, and Insomnia   Hypertension BP Readings from Last 3 Encounters:  02/03/20 (!) 154/80  01/19/20 130/82  01/08/20 130/72 .  Pharmacist Clinical Goal(s): o Over the next 180 days, patient will work with PharmD and providers to maintain BP goal <140/90 . Current regimen:  o Metoprolol tartrate 12.5 mg (1/2 of 50 mg) twice a day . Interventions: o Discussed BP goals and benefits of medication, diet, and exercise to reduce cardiovascular risk . Patient self care activities - Over the next 180 days, patient will: o Check BP 1-2 times per week, document, and provide at future appointments o Ensure daily salt intake < 2300 mg/day  Hyperlipidemia Lab Results  Component Value Date   LDLDIRECT 127.1 07/08/2013   LDLCALC 76 07/03/2019   LDLCALC 133 (H) 07/14/2015 .  Pharmacist Clinical Goal(s): o Over the next 180 days, patient will work with PharmD and providers to maintain LDL goal < 100 . Current regimen:  o Rosuvastatin 10 mg daily . Interventions: o Discussed cholesterol goals and benefits of statin, diet and exercise to reduce cardiovascular risk . Patient self care activities - Over the next 180 days, patient will: o Continue medication as prescribed o Continue low cholesterol diet and exercise routine  Atrial Fibrillation Pulse Readings from Last 3 Encounters:  02/03/20 (!) 55  01/19/20 (!) 58  01/08/20 64 .  Pharmacist Clinical Goal(s) o Over the next 180 days, patient will work with PharmD and providers to optimize therapy . Current regimen:  o flecainide 50 mg twice a day o metoprolol tartrate 12.5 mg (1/2 of 25 mg) twice a day o Xarelto 20 mg  daily . Interventions: o Discussed benefits of Xarelto for lifelong reduction in stroke risk and blood clots . Patient self care activities - Over the next 180 days, patient will: o Continue medications as prescribed o Monitor for signs and symptoms of bleeding  Insomnia . Pharmacist Clinical Goal(s) o Over the next 180 days, patient will work with PharmD and providers to improve sleep  . Current regimen:  o Zolpidem 10 mg at bedtime as needed o Melatonin 10 mg at bedtime . Interventions: o Discussed calming sleep routine before bedtime . Patient self care activities - Over the next 180 days, patient will: o Continue medications as prescribed o Follow up with PCP as needed  Medication management . Pharmacist Clinical Goal(s): o Over the next 180 days, patient will work with PharmD and providers to maintain optimal medication adherence . Current pharmacy: [facility] . Interventions o Comprehensive medication review performed. o Continue current medication management strategy . Patient self care activities - Over the next 180 days, patient will: o Focus on medication adherence by pill box o Take medications as prescribed o Report any questions or concerns to PharmD and/or provider(s)  Please see past updates related to this goal by clicking on the "Past Updates" button in the selected goal        The patient verbalized understanding of instructions, educational materials, and care plan provided today and declined offer to receive copy of patient instructions, educational materials, and care plan.  Telephone follow up appointment with pharmacy team  member scheduled for: 6 months  Charlene [name], PharmD, [facility] Clinical Pharmacist [facility] at [facility] [phone]

## 2020-12-01 ENCOUNTER — Other Ambulatory Visit (HOSPITAL_COMMUNITY): Payer: Self-pay | Admitting: Internal Medicine
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
## 2020-12-21 ENCOUNTER — Other Ambulatory Visit: Payer: Self-pay

## 2020-12-21 ENCOUNTER — Encounter (HOSPITAL_COMMUNITY): Payer: Self-pay | Admitting: Physician Assistant

## 2020-12-21 ENCOUNTER — Ambulatory Visit (HOSPITAL_COMMUNITY)
Admission: RE | Admit: 2020-12-21 | Discharge: 2020-12-21 | Disposition: A | Discharge status: Home / Self Care | Payer: Medicare Other | Source: Ambulatory Visit | Attending: Physician Assistant | Admitting: Physician Assistant

## 2020-12-21 VITALS — BP 140/60 | HR 63 | Ht 66.0 in | Wt 199.8 lb

## 2020-12-21 DIAGNOSIS — E669 Obesity, unspecified: Secondary | ICD-10-CM | POA: Insufficient documentation

## 2020-12-21 DIAGNOSIS — Z79899 Other long term (current) drug therapy: Secondary | ICD-10-CM | POA: Insufficient documentation

## 2020-12-21 DIAGNOSIS — Z7182 Exercise counseling: Secondary | ICD-10-CM | POA: Diagnosis not present

## 2020-12-21 DIAGNOSIS — Z96653 Presence of artificial knee joint, bilateral: Secondary | ICD-10-CM | POA: Insufficient documentation

## 2020-12-21 DIAGNOSIS — I441 Atrioventricular block, second degree: Secondary | ICD-10-CM | POA: Diagnosis not present

## 2020-12-21 DIAGNOSIS — D6869 Other thrombophilia: Secondary | ICD-10-CM | POA: Diagnosis not present

## 2020-12-21 DIAGNOSIS — I4819 Other persistent atrial fibrillation: Secondary | ICD-10-CM | POA: Insufficient documentation

## 2020-12-21 DIAGNOSIS — Z79890 Hormone replacement therapy (postmenopausal): Secondary | ICD-10-CM | POA: Insufficient documentation

## 2020-12-21 DIAGNOSIS — Z7901 Long term (current) use of anticoagulants: Secondary | ICD-10-CM | POA: Insufficient documentation

## 2020-12-21 DIAGNOSIS — Z6832 Body mass index (BMI) 32.0-32.9, adult: Secondary | ICD-10-CM | POA: Diagnosis not present

## 2020-12-21 LAB — CBC
HCT: 38.2 % (ref 36.0–46.0)
Hemoglobin: 12.3 g/dL (ref 12.0–15.0)
MCH: 30.7 pg (ref 26.0–34.0)
MCHC: 32.2 g/dL (ref 30.0–36.0)
MCV: 95.3 fL (ref 80.0–100.0)
Platelets: 241 10*3/uL (ref 150–400)
RBC: 4.01 MIL/uL (ref 3.87–5.11)
RDW: 12.6 % (ref 11.5–15.5)
WBC: 5.3 10*3/uL (ref 4.0–10.5)
nRBC: 0 % (ref 0.0–0.2)

## 2020-12-21 LAB — BASIC METABOLIC PANEL
Anion gap: 8 (ref 5–15)
BUN: 18 mg/dL (ref 8–23)
CO2: 26 mmol/L (ref 22–32)
Calcium: 9.4 mg/dL (ref 8.9–10.3)
Chloride: 107 mmol/L (ref 98–111)
Creatinine, Ser: 0.84 mg/dL (ref 0.44–1.00)
GFR, Estimated: >60 mL/min (ref >60)
Glucose, Bld: 100 mg/dL — ABNORMAL HIGH (ref 70–99)
Potassium: 4.4 mmol/L (ref 3.5–5.1)
Sodium: 141 mmol/L (ref 135–145)

## 2021-02-09 ENCOUNTER — Telehealth: Payer: Self-pay | Admitting: Pharmacist

## 2021-02-09 NOTE — Progress Notes (Signed)
° ° °  Chronic Care Management Pharmacy Assistant   Name: Olanna Percifield  MRN: [id number] DOB: [birth year]  Reason for Encounter: General Adherence Call   PCP : Janith Lima, MD  Allergies:   Allergies  Allergen Reactions   Meperidine Hcl Rash   Penicillins Rash    Rash as child    Medications: Outpatient Encounter Medications as of 02/09/2021  Medication Sig   Cholecalciferol 1.25 MG (50000 UT) capsule Take 1 capsule (50,000 Units total) by mouth once a week.   flecainide (TAMBOCOR) 50 MG tablet Take 0.5 tablets (25 mg total) by mouth 2 (two) times daily.   levothyroxine (SYNTHROID) 50 MCG tablet TAKE 1/2 TABLET ONCE A DAY IN THE MORNING ON AN EMPTY STOMACH.   Melatonin 10 MG TABS Take by mouth.   metoprolol tartrate (LOPRESSOR) 25 MG tablet TAKE (1/2) TABLET TWICE DAILY.   rosuvastatin (CRESTOR) 10 MG tablet TAKE 1 TABLET EACH DAY.   XARELTO 20 MG TABS tablet TAKE 1 TABLET ONCE DAILY.   zolpidem (AMBIEN) 10 MG tablet Take 1 tablet (10 mg total) by mouth at bedtime as needed for sleep.   No facility-administered encounter medications on file as of 02/09/2021.    Current Diagnosis: Patient Active Problem List   Diagnosis Date Noted   Secondary hypercoagulable state ([place]) 02/03/2020   Psychophysiological insomnia 08/19/2019   Iron deficiency anemia secondary to inadequate dietary iron intake 12/30/2018   Atrial fibrillation with RVR ([place]) 10/31/2016   History of pulmonary embolus (PE) 09/21/2016   Routine general medical examination at a health care facility 07/16/2016   OAB (overactive bladder) 07/13/2016   Estrogen deficiency 07/13/2016   Atrial flutter, paroxysmal ([place]) 12/15/2014   Obesity (BMI 30-39.9) 07/09/2014   Hypothyroidism 09/01/2013   Persistent atrial fibrillation ([place]) 11/04/2012   Anticoagulation management encounter 10/25/2012   Vitamin D deficiency 03/02/2009   Dyslipidemia, goal LDL below 100 03/13/2008   DEGENERATIVE  JOINT DISEASE, ADVANCED 03/13/2008    Goals Addressed   None     Follow-Up:  Pharmacist Review    A general wellness adherence call was made to the Ms. Ellena to check in and ask how she has been doing since she last spoke with the clinical pharmacist Mendel Ryder. The patient stated that she has been doing wonderful, she is not having any health challenges or issues at this time. The patient states that she is in [place] for the winter and that she will be back in a few month. She states that she will make a appointment to see Dr. [name] when she returns.   Wendy Poet, [place] [phone]   Total time:15

## 2021-03-21 ENCOUNTER — Other Ambulatory Visit (HOSPITAL_COMMUNITY): Payer: Self-pay | Admitting: Internal Medicine

## 2021-03-21 ENCOUNTER — Other Ambulatory Visit: Payer: Self-pay | Admitting: Internal Medicine

## 2021-03-21 DIAGNOSIS — E785 Hyperlipidemia, unspecified: Secondary | ICD-10-CM

## 2021-03-21 DIAGNOSIS — E039 Hypothyroidism, unspecified: Secondary | ICD-10-CM

## 2021-03-23 DIAGNOSIS — R1030 Lower abdominal pain, unspecified: Secondary | ICD-10-CM | POA: Diagnosis not present

## 2021-03-23 DIAGNOSIS — N3000 Acute cystitis without hematuria: Secondary | ICD-10-CM | POA: Diagnosis not present

## 2021-04-04 DIAGNOSIS — Z961 Presence of intraocular lens: Secondary | ICD-10-CM | POA: Diagnosis not present

## 2021-04-04 DIAGNOSIS — H5213 Myopia, bilateral: Secondary | ICD-10-CM | POA: Diagnosis not present

## 2021-04-04 DIAGNOSIS — H43813 Vitreous degeneration, bilateral: Secondary | ICD-10-CM | POA: Diagnosis not present

## 2021-04-04 DIAGNOSIS — H25811 Combined forms of age-related cataract, right eye: Secondary | ICD-10-CM | POA: Diagnosis not present

## 2021-05-03 ENCOUNTER — Other Ambulatory Visit: Payer: Self-pay | Admitting: Internal Medicine

## 2021-05-03 DIAGNOSIS — Z1231 Encounter for screening mammogram for malignant neoplasm of breast: Secondary | ICD-10-CM

## 2021-05-25 ENCOUNTER — Telehealth: Payer: Self-pay | Admitting: Pharmacist

## 2021-05-25 ENCOUNTER — Telehealth: Payer: Medicare Other

## 2021-05-25 NOTE — Progress Notes (Deleted)
Chronic Care Management Pharmacy Note  05/25/2021 Name:  [name] MRN:  [id number] DOB:  08-24-1941  Summary: ***  Recommendations/Changes made from today's visit: ***  Plan: ***  Subjective: [name] is an 80 y.o. year old female who is a primary patient of Janith Lima, MD.  The CCM team was consulted for assistance with disease management and care coordination needs.    Engaged with patient by telephone for follow up visit in response to provider referral for pharmacy case management and/or care coordination services.   Consent to Services:  The patient was given information about Chronic Care Management services, agreed to services, and gave verbal consent prior to initiation of services.  Please see initial visit note for detailed documentation.   Patient Care Team: Janith Lima, MD as PCP - General (Internal Medicine) Bensimhon, Shaune Pascal, MD as Consulting Physician (Cardiology) Fontaine, Belinda Block, MD (Inactive) as Consulting Physician (Gynecology) Ladene Artist, MD as Consulting Physician (Gastroenterology) Shon Hough, MD as Consulting Physician (Ophthalmology) Charlton Haws, [facility] as Pharmacist (Pharmacist)  Recent office visits: ***  Recent consult visits: 03/23/21 Dr Julaine Hua [facility] ID): eval UTI. Rx'd Keflex  12/21/20 PA [place] (Afib clinic): no complaints, no med changes.  Hospital visits: None in previous 6 months   Objective:  Lab Results  Component Value Date   CREATININE 0.84 12/21/2020   BUN 18 12/21/2020   GFR 59.62 (L) 07/03/2019   GFRNONAA >60 12/21/2020   GFRAA >60 01/08/2020   NA 141 12/21/2020   K 4.4 12/21/2020   CALCIUM 9.4 12/21/2020   CO2 26 12/21/2020   GLUCOSE 100 (H) 12/21/2020    Lab Results  Component Value Date/Time   HGBA1C 5.5 03/13/2008 09:26 AM   HGBA1C 5.5 12/03/2006 10:30 AM   GFR 59.62 (L) 07/03/2019 10:56 AM   GFR 54.46 (L) 07/18/2018 09:24 AM   MICROALBUR 0.8  12/03/2006 10:30 AM    Last diabetic Eye exam: No results found for: HMDIABEYEEXA  Last diabetic Foot exam: No results found for: HMDIABFOOTEX   Lab Results  Component Value Date   CHOL 165 07/06/2020   HDL 70 07/06/2020   LDLCALC 81 07/06/2020   LDLDIRECT 127.1 07/08/2013   TRIG 65 07/06/2020   CHOLHDL 2.4 07/06/2020    Hepatic Function Latest Ref Rng & Units 07/06/2020 08/29/2019 09/04/2018  Total Protein 6.1 - 8.1 g/dL 7.1 7.2 7.3  Albumin 3.5 - 5.0 g/dL - 4.0 4.1  AST 10 - 35 U/L 20 20 29   ALT 6 - 29 U/L 14 17 16   Alk Phosphatase 38 - 126 U/L - 71 63  Total Bilirubin 0.2 - 1.2 mg/dL 0.4 0.6 0.8  Bilirubin, Direct 0.0 - 0.2 mg/dL 0.1 - -    Lab Results  Component Value Date/Time   TSH 2.91 07/06/2020 09:47 AM   TSH 3.283 01/08/2020 11:22 AM   TSH 3.52 01/05/2020 08:43 AM   FREET4 0.43 (L) 09/15/2013 02:30 PM   FREET4 0.9 03/13/2008 09:26 AM    CBC Latest Ref Rng & Units 12/21/2020 01/08/2020 01/05/2020  WBC 4.0 - 10.5 K/uL 5.3 6.8 6.6  Hemoglobin 12.0 - 15.0 g/dL 12.3 13.6 13.8  Hematocrit 36.0 - 46.0 % 38.2 43.9 42.4  Platelets 150 - 400 K/uL 241 285 257.0    Lab Results  Component Value Date/Time   VD25OH 13 (L) 07/06/2020 09:47 AM   VD25OH 27.39 (L) 07/03/2019 10:56 AM   VD25OH 36.15 07/18/2018 09:24 AM    Clinical  ASCVD: No  The ASCVD Risk score Mikey Bussing DC Jr., et al., 2013) failed to calculate for the following reasons:   The 2013 ASCVD risk score is only valid for ages 19 to 76    Depression screen PHQ 2/9 07/26/2020 07/21/2019 07/03/2019  Decreased Interest 0 0 0  Down, Depressed, Hopeless 0 0 0  PHQ - 2 Score 0 0 0    CHA2DS2-VASc Score = 4  The patient's score is based upon: CHF History: No HTN History: Yes Diabetes History: No Stroke History: No Vascular Disease History: No Age Score: 2 Gender Score: 1   {Confirm score is correct.  If not, click here to update score.  REFRESH note. :[id number]}   Social History   Tobacco Use  Smoking Status Never  Smoker  Smokeless Tobacco Never Used   BP Readings from Last 3 Encounters:  12/21/20 140/60  07/06/20 (!) 158/74  02/03/20 (!) 154/80   Pulse Readings from Last 3 Encounters:  12/21/20 63  07/06/20 60  02/03/20 (!) 55   Wt Readings from Last 3 Encounters:  12/21/20 199 lb 12.8 oz (90.6 kg)  07/06/20 214 lb (97.1 kg)  06/21/20 200 lb (90.7 kg)   BMI Readings from Last 3 Encounters:  12/21/20 32.25 kg/m  07/06/20 34.54 kg/m  06/21/20 32.28 kg/m    Assessment/Interventions: Review of patient past medical history, allergies, medications, health status, including review of consultants reports, laboratory and other test data, was performed as part of comprehensive evaluation and provision of chronic care management services.   SDOH:  (Social Determinants of Health) assessments and interventions performed: Yes  SDOH Screenings   Alcohol Screen: Low Risk   . Last Alcohol Screening Score (AUDIT): 2  Depression (PHQ2-9): Low Risk   . PHQ-2 Score: 0  Financial Resource Strain: Low Risk   . Difficulty of Paying Living Expenses: Not hard at all  Food Insecurity: No Food Insecurity  . Worried About Charity fundraiser in the Last Year: Never true  . Ran Out of Food in the Last Year: Never true  Housing: Low Risk   . Last Housing Risk Score: 0  Physical Activity: Sufficiently Active  . Days of Exercise per Week: 5 days  . Minutes of Exercise per Session: 30 min  Social Connections: Socially Integrated  . Frequency of Communication with Friends and Family: More than three times a week  . Frequency of Social Gatherings with Friends and Family: More than three times a week  . Attends Religious Services: More than 4 times per year  . Active Member of Clubs or Organizations: Yes  . Attends Archivist Meetings: More than 4 times per year  . Marital Status: Married  Stress: No Stress Concern Present  . Feeling of Stress : Not at all  Tobacco Use: Low Risk   . Smoking  Tobacco Use: Never Smoker  . Smokeless Tobacco Use: Never Used  Transportation Needs: No Transportation Needs  . Lack of Transportation (Medical): No  . Lack of Transportation (Non-Medical): No    CCM Care Plan  Allergies  Allergen Reactions  . Meperidine Hcl Rash  . Penicillins Rash    Rash as child    Medications Reviewed Today    Reviewed by Hinda Kehr, CMA (Certified Medical Assistant) on 12/21/20 at [place] List Status: <None>  Medication Order Taking? Sig Documenting Provider Last Dose Status Informant  Cholecalciferol 1.25 MG (50000 UT) capsule [id number] Yes Take 1 capsule (50,000 Units total) by mouth  once a week. Janith Lima, MD Taking Active   flecainide [facility]) 50 MG tablet [id number] Yes Take 0.5 tablets (25 mg total) by mouth 2 (two) times daily. Thompson Grayer, MD Taking Active   levothyroxine (SYNTHROID) 50 MCG tablet [id number] Yes TAKE 1/2 TABLET ONCE A DAY IN THE MORNING ON AN EMPTY STOMACH. Janith Lima, MD Taking Active   Melatonin 10 MG TABS [id number] Yes Take by mouth. [provider] Taking Active   metoprolol tartrate (LOPRESSOR) 25 MG tablet [id number] Yes TAKE (1/2) TABLET TWICE DAILY. Bensimhon, Shaune Pascal, MD Taking Active   rosuvastatin (CRESTOR) 10 MG tablet [id number] Yes TAKE 1 TABLET EACH DAY. Janith Lima, MD Taking Active   XARELTO 20 MG TABS tablet [id number] Yes TAKE 1 TABLET ONCE DAILY. Bensimhon, Shaune Pascal, MD Taking Active   zolpidem (AMBIEN) 10 MG tablet [id number] Yes Take 1 tablet (10 mg total) by mouth at bedtime as needed for sleep. Janith Lima, MD Taking Active   Med List Note Otilio Miu, [facility] 11/01/16 1008):            Patient Active Problem List   Diagnosis Date Noted  . Secondary hypercoagulable state ([place]) 02/03/2020  . Psychophysiological insomnia 08/19/2019  . Iron deficiency anemia secondary to inadequate dietary iron intake 12/30/2018  . Atrial fibrillation with RVR ([place]) 10/31/2016  .  History of pulmonary embolus (PE) 09/21/2016  . Routine general medical examination at a health care facility 07/16/2016  . OAB (overactive bladder) 07/13/2016  . Estrogen deficiency 07/13/2016  . Atrial flutter, paroxysmal ([place]) 12/15/2014  . Obesity (BMI 30-39.9) 07/09/2014  . Hypothyroidism 09/01/2013  . Persistent atrial fibrillation ([place]) 11/04/2012  . Anticoagulation management encounter 10/25/2012  . Vitamin D deficiency 03/02/2009  . Dyslipidemia, goal LDL below 100 03/13/2008  . DEGENERATIVE JOINT DISEASE, ADVANCED 03/13/2008    Immunization History  Administered Date(s) Administered  . Influenza, High Dose Seasonal PF 10/27/2014, 11/01/2017, 10/27/2018, 10/24/2019  . PFIZER(Purple Top)SARS-COV-2 Vaccination 01/15/2020, 02/05/2020  . Pneumococcal Conjugate-13 11/10/2015  . Pneumococcal Polysaccharide-23 05/02/2010, 01/05/2020  . Td 03/02/2009  . Tdap 07/03/2019  . Zoster Recombinat (Shingrix) 08/04/2020  . Zoster, Live 06/13/2013    Conditions to be addressed/monitored:  {USCCMDZASSESSMENTOPTIONS:23563}  There are no care plans that you recently modified to display for this patient.    Medication Assistance: {MEDASSISTANCEINFO:25044}  Compliance/Adherence/Medication fill history: Care Gaps: Covid booster (shot #3) - due 07/04/20 Shingrix 2 of 2 (due 09/29/20)  Star-Rating Drugs: Rosuvastatin - LF 12/28/20 x 90 ds (overdue - refilled 03/21/21 to [facility])  Patient's preferred pharmacy is:  [place], [place] [address] Phone: [phone] Fax: [phone]  Uses pill box? Yes Pt endorses 100% compliance  We discussed: {Pharmacy options:24294} Patient decided to: {US Pharmacy Plan:23885}  Care Plan and Follow Up Patient Decision:  {FOLLOWUP:24991}  Plan: {CM FOLLOW UP FBXU:38333}  ***    Current Barriers:  . {pharmacybarriers:24917}  Pharmacist Clinical  Goal(s):  Marland Kitchen Patient will {PHARMACYGOALCHOICES:24921} through collaboration with PharmD and provider.   Interventions: . 1:1 collaboration with Janith Lima, MD regarding development and update of comprehensive plan of care as evidenced by provider attestation and co-signature . Inter-disciplinary care team collaboration (see longitudinal plan of care) . Comprehensive medication review performed; medication list updated in electronic medical record  AFIB   Patient is currently rate controlled.  Patient has failed these meds in past: amiodarone, diltiazem  Patient is currently controlled on the following medications:   flecainide 50 mg BID,   metoprolol tartrate 12.5 mg (1/2 of 25 mg) BID,   Xarelto 20 mg daily  We discussed: adherence with rate/rhythm control medications and need for lifelong anticoagulation given her history of multiple VTE and Afib. Pt denies ever having issues with bleeding.  Plan: Continue current medications  Hypertension   BP goal < 140/90  Patient checks BP at home several times per month Patient home BP readings are ranging: 130s/80s  Patient has failed these meds in the past: n/a Patient is currently controlled on:   metoprolol tartrate 12.5 mg BID  We discussed: diet and exercise. Pt goes to gym and aerobics classes 3 days a week.   Plan: Continue current medications and control with diet and exercise   Hyperlipidemia   LDL goal < 100 Patient has failed these meds in past: n/a Patient is currently controlled on the following medications:   rosuvastatin 10 mg daily  We discussed:  diet and exercise, pt is quite active. She reports adherence to statin and denies any issues.   Plan: Continue current medications   Insomnia   Patient has failed these meds in past: zolpidem Patient is currently controlled on the following medications: s  Zolpidem 10 mg HS  Melatonin 10 mg HS  We discussed: Patient is satisfied with  current regimen and denies issues  Plan: Continue current medications   Vitamin D deficiency   Patient has failed these meds in past: n/a Patient is currently controlled on the following medications:   Vitamin D 50,000 IU weekly  We discussed:  Started replacement therapy in July  Plan: Continue current medications   Patient Goals/Self-Care Activities . Patient will:  - {pharmacypatientgoals:24919}  Follow Up Plan: {CM FOLLOW UP OXBD:53299}

## 2021-05-25 NOTE — Telephone Encounter (Signed)
  Chronic Care Management   Outreach Note  05/25/2021 Name: [name] MRN: [id number] DOB: 07/15/1941  Referred by: Janith Lima, MD  Patient had a phone appointment scheduled with clinical pharmacist today.  An unsuccessful telephone outreach was attempted today. The patient was referred to the pharmacist for assistance with care management and care coordination.   If possible, a message was left to return call to: [phone] or to [facility]: [place], PharmD, Para March, CPP Clinical Pharmacist [facility] at [facility] [phone]

## 2021-06-11 ENCOUNTER — Other Ambulatory Visit (HOSPITAL_COMMUNITY): Payer: Self-pay | Admitting: Internal Medicine

## 2021-06-13 ENCOUNTER — Other Ambulatory Visit (HOSPITAL_COMMUNITY): Payer: Self-pay | Admitting: Internal Medicine
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
## 2021-06-21 ENCOUNTER — Other Ambulatory Visit: Payer: Self-pay

## 2021-06-21 ENCOUNTER — Telehealth: Payer: Self-pay | Admitting: Pharmacist

## 2021-06-21 ENCOUNTER — Ambulatory Visit (HOSPITAL_COMMUNITY)
Admission: RE | Admit: 2021-06-21 | Discharge: 2021-06-21 | Disposition: A | Discharge status: Home / Self Care | Payer: Medicare Other | Source: Ambulatory Visit | Attending: Physician Assistant | Admitting: Physician Assistant

## 2021-06-21 ENCOUNTER — Encounter (HOSPITAL_COMMUNITY): Payer: Self-pay | Admitting: Physician Assistant

## 2021-06-21 VITALS — BP 114/64 | HR 59 | Ht 66.0 in | Wt 186.6 lb

## 2021-06-21 DIAGNOSIS — E669 Obesity, unspecified: Secondary | ICD-10-CM | POA: Diagnosis not present

## 2021-06-21 DIAGNOSIS — Z7901 Long term (current) use of anticoagulants: Secondary | ICD-10-CM | POA: Insufficient documentation

## 2021-06-21 DIAGNOSIS — I441 Atrioventricular block, second degree: Secondary | ICD-10-CM | POA: Insufficient documentation

## 2021-06-21 DIAGNOSIS — Z86711 Personal history of pulmonary embolism: Secondary | ICD-10-CM | POA: Diagnosis not present

## 2021-06-21 DIAGNOSIS — I4819 Other persistent atrial fibrillation: Secondary | ICD-10-CM | POA: Insufficient documentation

## 2021-06-21 DIAGNOSIS — Z6830 Body mass index (BMI) 30.0-30.9, adult: Secondary | ICD-10-CM | POA: Insufficient documentation

## 2021-06-21 DIAGNOSIS — E785 Hyperlipidemia, unspecified: Secondary | ICD-10-CM | POA: Diagnosis not present

## 2021-06-21 DIAGNOSIS — Z79899 Other long term (current) drug therapy: Secondary | ICD-10-CM | POA: Diagnosis not present

## 2021-06-21 DIAGNOSIS — I447 Left bundle-branch block, unspecified: Secondary | ICD-10-CM | POA: Insufficient documentation

## 2021-06-21 DIAGNOSIS — D6869 Other thrombophilia: Secondary | ICD-10-CM | POA: Insufficient documentation

## 2021-06-21 DIAGNOSIS — I4892 Unspecified atrial flutter: Secondary | ICD-10-CM | POA: Diagnosis not present

## 2021-06-21 NOTE — Progress Notes (Deleted)
error 

## 2021-06-21 NOTE — Chronic Care Management (AMB) (Signed)
Chronic Care Management Pharmacy Assistant   Name: [name] MRN: [id number] DOB: May 26, 1941  Reason for Encounter: Disease State - Hypertension   Recent office visits:  None noted  Recent consult visits:  06/21/21 [facility] (Cardiology) - Persistent atrial fibrillation. No med changes. EKG done.  03/23/21 [facility] (Infectious Disease) - Complaints of UTI. Start Ciprofloxacin 500 mg.  Hospital visits:  None in previous 6 months  Medications: Outpatient Encounter Medications as of 06/21/2021  Medication Sig   Calcium Carbonate-Vit D-Min (CALCIUM 1200 PO) Take by mouth.   flecainide (TAMBOCOR) 50 MG tablet Take 0.5 tablets (25 mg total) by mouth 2 (two) times daily.   levothyroxine (SYNTHROID) 50 MCG tablet TAKE 1/2 TABLET ONCE A DAY IN THE MORNING ON AN EMPTY STOMACH.   magnesium gluconate (MAGONATE) 500 MG tablet Take 500 mg by mouth daily.   Melatonin 10 MG TABS Take by mouth.   metoprolol tartrate (LOPRESSOR) 25 MG tablet Take 0.5 tablets (12.5 mg total) by mouth 2 (two) times daily. Needs appt   Potassium 99 MG TABS Take by mouth.   rosuvastatin (CRESTOR) 10 MG tablet TAKE 1 TABLET EACH DAY.   XARELTO 20 MG TABS tablet Take 1 tablet (20 mg total) by mouth daily. Needs appt   zolpidem (AMBIEN) 10 MG tablet Take 1 tablet (10 mg total) by mouth at bedtime as needed for sleep.   No facility-administered encounter medications on file as of 06/21/2021.    Reviewed chart prior to disease state call. Spoke with patient regarding BP  Recent Office Vitals: BP Readings from Last 3 Encounters:  06/21/21 114/64  12/21/20 140/60  07/06/20 (!) 158/74   Pulse Readings from Last 3 Encounters:  06/21/21 (!) 59  12/21/20 63  07/06/20 60    Wt Readings from Last 3 Encounters:  06/21/21 186 lb 9.6 oz (84.6 kg)  12/21/20 199 lb 12.8 oz (90.6 kg)  07/06/20 214 lb (97.1 kg)     Kidney Function Lab Results  Component Value Date/Time   CREATININE 0.84 12/21/2020 10:24 AM    CREATININE 0.95 (H) 07/06/2020 09:47 AM   CREATININE 0.88 01/08/2020 10:53 AM   GFR 59.62 (L) 07/03/2019 10:56 AM   GFRNONAA >60 12/21/2020 10:24 AM   GFRAA >60 01/08/2020 10:53 AM    BMP Latest Ref Rng & Units 12/21/2020 07/06/2020 01/08/2020  Glucose 70 - 99 mg/dL 100(H) 93 102(H)  BUN 8 - 23 mg/dL 18 28(H) 25(H)  Creatinine 0.44 - 1.00 mg/dL 0.84 0.95(H) 0.88  BUN/Creat Ratio 6 - 22 (calc) - 29(H) -  Sodium 135 - 145 mmol/L 141 143 140  Potassium 3.5 - 5.1 mmol/L 4.4 4.1 4.6  Chloride 98 - 111 mmol/L 107 107 104  CO2 22 - 32 mmol/L 26 21 26   Calcium 8.9 - 10.3 mg/dL 9.4 9.7 9.6    Current antihypertensive regimen:  Metoprolol tartrate 12.5 mg (1/2 of 50 mg) twice a day  How often are you checking your Blood Pressure? 3-5x per week  Current home BP readings:   Patient states she doesn't record her readings.  What recent interventions/DTPs have been made by any provider to improve Blood Pressure control since last CPP Visit:  Patient states she seen her cardiologists on yesterday and all her reports where good.  Any recent hospitalizations or ED visits since last visit with CPP? No  What diet changes have been made to improve Blood Pressure Control?  Patient states she tries to ensure her daily salt intake  to  less than 2300 mg/day.  What exercise is being done to improve your Blood Pressure Control?  Patient states she tries to walk 10,000 steps a day and she does some water aerobics.  Adherence Review: Is the patient currently on ACE/ARB medication? Yes Does the patient have >5 day gap between last estimated fill dates? No Metoprolol tartrate - last fill 03/23/21 90D  Star Rating Drugs: Rosuvastatin - 03/23/21 90D  Patient states she needs a refill on her Ambien 10 mg.  [name], RMA Clinical Pharmacists Assistant [phone]  Time Spent: [phone]

## 2021-06-23 NOTE — Progress Notes (Signed)
Chart Review  Reviewed chart for medication changes and adherence.  No OVs, Consults, or hospital visits since last care coordination call / Pharmacist visit. No medication changes indicated  No gaps in adherence identified. Patient has follow up scheduled with pharmacy team. No further action required.   [name], [place] Clinical Pharmacists Assistant [phone]  Time Spent: 9

## 2021-07-04 ENCOUNTER — Ambulatory Visit
Admission: RE | Admit: 2021-07-04 | Discharge: 2021-07-04 | Disposition: A | Discharge status: Home / Self Care | Payer: Medicare Other | Source: Ambulatory Visit | Attending: Internal Medicine | Admitting: Internal Medicine

## 2021-07-04 ENCOUNTER — Other Ambulatory Visit: Payer: Self-pay

## 2021-07-04 DIAGNOSIS — Z1231 Encounter for screening mammogram for malignant neoplasm of breast: Secondary | ICD-10-CM | POA: Diagnosis not present

## 2021-07-07 ENCOUNTER — Encounter: Payer: Self-pay | Admitting: Internal Medicine

## 2021-07-07 ENCOUNTER — Other Ambulatory Visit: Payer: Self-pay

## 2021-07-07 ENCOUNTER — Ambulatory Visit (INDEPENDENT_AMBULATORY_CARE_PROVIDER_SITE_OTHER): Payer: Medicare Other | Admitting: Internal Medicine

## 2021-07-07 VITALS — BP 126/82 | HR 62 | Temp 97.6°F | Ht 66.0 in | Wt 188.0 lb

## 2021-07-07 DIAGNOSIS — D6869 Other thrombophilia: Secondary | ICD-10-CM

## 2021-07-07 DIAGNOSIS — N1831 Chronic kidney disease, stage 3a: Secondary | ICD-10-CM | POA: Insufficient documentation

## 2021-07-07 DIAGNOSIS — Z0001 Encounter for general adult medical examination with abnormal findings: Secondary | ICD-10-CM

## 2021-07-07 DIAGNOSIS — E785 Hyperlipidemia, unspecified: Secondary | ICD-10-CM

## 2021-07-07 DIAGNOSIS — E559 Vitamin D deficiency, unspecified: Secondary | ICD-10-CM | POA: Diagnosis not present

## 2021-07-07 DIAGNOSIS — E039 Hypothyroidism, unspecified: Secondary | ICD-10-CM

## 2021-07-07 DIAGNOSIS — I4892 Unspecified atrial flutter: Secondary | ICD-10-CM | POA: Diagnosis not present

## 2021-07-07 DIAGNOSIS — D508 Other iron deficiency anemias: Secondary | ICD-10-CM

## 2021-07-07 DIAGNOSIS — F5104 Psychophysiologic insomnia: Secondary | ICD-10-CM | POA: Diagnosis not present

## 2021-07-07 LAB — CBC WITH DIFFERENTIAL/PLATELET
Basophils Absolute: 0 10*3/uL (ref 0.0–0.1)
Basophils Relative: 0.4 % (ref 0.0–3.0)
Eosinophils Absolute: 0.2 10*3/uL (ref 0.0–0.7)
Eosinophils Relative: 2.9 % (ref 0.0–5.0)
HCT: 35.9 % — ABNORMAL LOW (ref 36.0–46.0)
Hemoglobin: 12.1 g/dL (ref 12.0–15.0)
Lymphocytes Relative: 15.2 % (ref 12.0–46.0)
Lymphs Abs: 0.9 10*3/uL (ref 0.7–4.0)
MCHC: 33.7 g/dL (ref 30.0–36.0)
MCV: 90.6 fl (ref 78.0–100.0)
Monocytes Absolute: 0.4 10*3/uL (ref 0.1–1.0)
Monocytes Relative: 6.2 % (ref 3.0–12.0)
Neutro Abs: 4.5 10*3/uL (ref 1.4–7.7)
Neutrophils Relative %: 75.3 % (ref 43.0–77.0)
Platelets: 261 10*3/uL (ref 150.0–400.0)
RBC: 3.97 Mil/uL (ref 3.87–5.11)
RDW: 13.7 % (ref 11.5–15.5)
WBC: 6 10*3/uL (ref 4.0–10.5)

## 2021-07-07 LAB — LIPID PANEL
Cholesterol: 145 mg/dL (ref 0–200)
HDL: 60.2 mg/dL (ref >39.00)
LDL Cholesterol: 74 mg/dL (ref 0–99)
NonHDL: 85.04
Total CHOL/HDL Ratio: 2
Triglycerides: 56 mg/dL (ref 0.0–149.0)
VLDL: 11.2 mg/dL (ref 0.0–40.0)

## 2021-07-07 LAB — FERRITIN: Ferritin: 95 ng/mL (ref 10.0–291.0)

## 2021-07-07 LAB — BASIC METABOLIC PANEL
BUN: 19 mg/dL (ref 6–23)
CO2: 29 mEq/L (ref 19–32)
Calcium: 9.6 mg/dL (ref 8.4–10.5)
Chloride: 103 mEq/L (ref 96–112)
Creatinine, Ser: 0.98 mg/dL (ref 0.40–1.20)
GFR: 54.3 mL/min — ABNORMAL LOW (ref >60.00)
Glucose, Bld: 91 mg/dL (ref 70–99)
Potassium: 4.2 mEq/L (ref 3.5–5.1)
Sodium: 139 mEq/L (ref 135–145)

## 2021-07-07 LAB — VITAMIN D 25 HYDROXY (VIT D DEFICIENCY, FRACTURES): VITD: 39.37 ng/mL (ref 30.00–100.00)

## 2021-07-07 LAB — TSH: TSH: 3.03 u[IU]/mL (ref 0.35–5.50)

## 2021-07-07 LAB — IRON: Iron: 63 ug/dL (ref 42–145)

## 2021-07-07 MED ORDER — ZOLPIDEM TARTRATE 10 MG PO TABS: 10.0000 mg | ORAL_TABLET | Freq: Every evening | ORAL | 1 refills | Status: DC | PRN

## 2021-07-07 NOTE — Progress Notes (Signed)
Subjective:  Patient ID: [name], female    DOB: 03/31/1941  Age: 80 y.o. MRN: [id number]  CC: Atrial Fibrillation, Annual Exam, Hypothyroidism, and Hyperlipidemia  This visit occurred during the SARS-CoV-2 public health emergency.  Safety protocols were in place, including screening questions prior to the visit, additional usage of staff PPE, and extensive cleaning of exam room while observing appropriate contact time as indicated for disinfecting solutions.    HPI [name] presents for a CPX and f/up -   She has intentionally lost weight by using a weight watchers diet.  She continues to complain of insomnia and requests a refill of Ambien.  Outpatient Medications Prior to Visit  Medication Sig Dispense Refill   Calcium Carbonate-Vit D-Min (CALCIUM 1200 PO) Take by mouth.     flecainide (TAMBOCOR) 50 MG tablet Take 0.5 tablets (25 mg total) by mouth 2 (two) times daily. 90 tablet 3   levothyroxine (SYNTHROID) 50 MCG tablet TAKE 1/2 TABLET ONCE A DAY IN THE MORNING ON AN EMPTY STOMACH. 45 tablet 1   magnesium gluconate (MAGONATE) 500 MG tablet Take 500 mg by mouth daily.     Melatonin 10 MG TABS Take by mouth.     metoprolol tartrate (LOPRESSOR) 25 MG tablet Take 0.5 tablets (12.5 mg total) by mouth 2 (two) times daily. Needs appt 90 tablet 0   Potassium 99 MG TABS Take by mouth.     rosuvastatin (CRESTOR) 10 MG tablet TAKE 1 TABLET EACH DAY. 90 tablet 1   XARELTO 20 MG TABS tablet Take 1 tablet (20 mg total) by mouth daily. Needs appt 90 tablet 0   zolpidem (AMBIEN) 10 MG tablet Take 1 tablet (10 mg total) by mouth at bedtime as needed for sleep. 90 tablet 1   No facility-administered medications prior to visit.    ROS Review of Systems  Constitutional:  Negative for chills, diaphoresis, fatigue, fever and unexpected weight change.  HENT: Negative.  Negative for sore throat.   Eyes: Negative.  Negative for photophobia and pain.  Respiratory:  Negative  for cough, chest tightness, shortness of breath and wheezing.   Cardiovascular:  Negative for chest pain, palpitations and leg swelling.  Gastrointestinal:  Negative for abdominal pain, blood in stool, constipation, diarrhea, nausea and vomiting.  Endocrine: Negative.  Negative for polydipsia.  Genitourinary: Negative.  Negative for difficulty urinating, dysuria, frequency, hematuria and urgency.  Musculoskeletal: Negative.  Negative for arthralgias, back pain and myalgias.  Skin: Negative.  Negative for rash.  Neurological: Negative.  Negative for dizziness, tremors, facial asymmetry, weakness, light-headedness, numbness and headaches.  Hematological:  Negative for adenopathy. Does not bruise/bleed easily.  Psychiatric/Behavioral:  Positive for sleep disturbance. Negative for dysphoric mood and suicidal ideas. The patient is not nervous/anxious.    Objective:  BP 126/82 (BP Location: Left Arm, Patient Position: Sitting, Cuff Size: Large)   Pulse 62   Temp 97.6 F (36.4 C) (Oral)   Ht 5\' 6"  (1.676 m)   Wt 188 lb (85.3 kg)   SpO2 97%   BMI 30.34 kg/m   BP Readings from Last 3 Encounters:  07/07/21 126/82  06/21/21 114/64  12/21/20 140/60    Wt Readings from Last 3 Encounters:  07/07/21 188 lb (85.3 kg)  06/21/21 186 lb 9.6 oz (84.6 kg)  12/21/20 199 lb 12.8 oz (90.6 kg)    Physical Exam Vitals reviewed.  Constitutional:      Appearance: Normal appearance.  HENT:     Mouth/Throat:  Mouth: Mucous membranes are moist.  Eyes:     General: No scleral icterus.    Conjunctiva/sclera: Conjunctivae normal.  Cardiovascular:     Rate and Rhythm: Normal rate and regular rhythm.     Heart sounds: Murmur heard.  Systolic murmur is present with a grade of 1/6.  No diastolic murmur is present.    No gallop.     Comments: 1/6 SEM RUSB Pulmonary:     Effort: Pulmonary effort is normal.     Breath sounds: No stridor. No wheezing, rhonchi or rales.  Abdominal:     General:  Abdomen is flat. Bowel sounds are normal. There is no distension.     Palpations: Abdomen is soft. There is no hepatomegaly, splenomegaly or mass.     Tenderness: There is no abdominal tenderness.  Musculoskeletal:     Cervical back: Neck supple.     Right lower leg: No edema.     Left lower leg: No edema.  Lymphadenopathy:     Cervical: No cervical adenopathy.  Skin:    General: Skin is warm and dry.  Neurological:     General: No focal deficit present.     Mental Status: She is alert.  Psychiatric:        Mood and Affect: Mood normal.        Behavior: Behavior normal.    Lab Results  Component Value Date   WBC 6.0 07/07/2021   HGB 12.1 07/07/2021   HCT 35.9 (L) 07/07/2021   PLT 261.0 07/07/2021   GLUCOSE 91 07/07/2021   CHOL 145 07/07/2021   TRIG 56.0 07/07/2021   HDL 60.20 07/07/2021   LDLDIRECT 127.1 07/08/2013   LDLCALC 74 07/07/2021   ALT 14 07/06/2020   AST 20 07/06/2020   NA 139 07/07/2021   K 4.2 07/07/2021   CL 103 07/07/2021   CREATININE 0.98 07/07/2021   BUN 19 07/07/2021   CO2 29 07/07/2021   TSH 3.03 07/07/2021   INR 1.4 (H) 07/03/2019   HGBA1C 5.5 03/13/2008   MICROALBUR 0.8 12/03/2006    MM 3D SCREEN BREAST BILATERAL  Result Date: 07/07/2021 CLINICAL DATA:  Screening. EXAM: DIGITAL SCREENING BILATERAL MAMMOGRAM WITH TOMOSYNTHESIS AND CAD TECHNIQUE: Bilateral screening digital craniocaudal and mediolateral oblique mammograms were obtained. Bilateral screening digital breast tomosynthesis was performed. The images were evaluated with computer-aided detection. COMPARISON:  Previous exam(s). ACR Breast Density Category b: There are scattered areas of fibroglandular density. FINDINGS: There are no findings suspicious for malignancy. IMPRESSION: No mammographic evidence of malignancy. A result letter of this screening mammogram will be mailed directly to the patient. RECOMMENDATION: Screening mammogram in one year. (Code:SM-B-01Y) BI-RADS CATEGORY  1:  Negative. Electronically Signed   By: Marin Olp M.D.   On: 07/07/2021 08:50    Assessment & Plan:   Modean was seen today for atrial fibrillation, annual exam, hypothyroidism and hyperlipidemia.  Diagnoses and all orders for this visit:  Acquired hypothyroidism- Her TSH is in the normal range.  She will stay on the current dose of levothyroxine. -     TSH; Future -     TSH  Psychophysiological insomnia -     zolpidem (AMBIEN) 10 MG tablet; Take 1 tablet (10 mg total) by mouth at bedtime as needed for sleep. -     VITAMIN D 25 Hydroxy (Vit-D Deficiency, Fractures); Future -     VITAMIN D 25 Hydroxy (Vit-D Deficiency, Fractures)  Iron deficiency anemia secondary to inadequate dietary iron intake- She is mildly  anemic but her iron level is normal.  I have asked her to return soon to have this reevaluated. -     CBC with Differential/Platelet; Future -     Iron; Future -     Ferritin; Future -     Ferritin -     Iron -     CBC with Differential/Platelet  Vitamin D deficiency- Her D is normal now. -     VITAMIN D 25 Hydroxy (Vit-D Deficiency, Fractures); Future -     VITAMIN D 25 Hydroxy (Vit-D Deficiency, Fractures)  Dyslipidemia, goal LDL below 100- LDL goal achieved. Doing well on the statin  -     Lipid panel; Future -     TSH; Future -     TSH -     Lipid panel  Secondary hypercoagulable state ([place])- Will continue the DOAC. -     CBC with Differential/Platelet; Future -     Basic metabolic panel; Future -     Basic metabolic panel -     CBC with Differential/Platelet  Atrial flutter, paroxysmal ([place])- She has good rate and rhythm control.  Will continue the current dose of the DOAC.  Stage 3a chronic kidney disease ([place])- She will avoid nephrotoxic agents.  I am having Royetta Crochet. Madero maintain her flecainide, Melatonin, rosuvastatin, levothyroxine, metoprolol tartrate, Xarelto, Calcium Carbonate-Vit D-Min (CALCIUM 1200 PO), magnesium gluconate, Potassium, and  zolpidem.  Meds ordered this encounter  Medications   zolpidem (AMBIEN) 10 MG tablet    Sig: Take 1 tablet (10 mg total) by mouth at bedtime as needed for sleep.    Dispense:  90 tablet    Refill:  1     Follow-up: Return in about 6 months (around 01/07/2022).  Scarlette Calico, MD

## 2021-07-08 DIAGNOSIS — Z0001 Encounter for general adult medical examination with abnormal findings: Secondary | ICD-10-CM | POA: Insufficient documentation

## 2021-07-08 NOTE — Assessment & Plan Note (Signed)
Exam completed Labs reviewed Vaccines are up-to-date Cancer screenings are up-to-date Patient education was given 

## 2021-08-03 ENCOUNTER — Telehealth: Payer: Self-pay | Admitting: Pharmacist

## 2021-08-03 NOTE — Progress Notes (Signed)
Chronic Care Management Pharmacy Assistant   Name: [name] MRN: [id number] DOB: December 19, 1941  Reason for Encounter: Disease State   Conditions to be addressed/monitored: HTN  Recent office visits:  07/07/21 [name] (PCP) - Annual Exam. D/C Melatonin. F/u 6 mos.  Recent consult visits:  06/21/21 [name] (Cardiology) - Persistent atrial fibrillation. No med changes. EKG done.  Hospital visits:  None in previous 6 months  Medications: Outpatient Encounter Medications as of 08/03/2021  Medication Sig   Calcium Carbonate-Vit D-Min (CALCIUM 1200 PO) Take by mouth.   flecainide (TAMBOCOR) 50 MG tablet Take 0.5 tablets (25 mg total) by mouth 2 (two) times daily.   levothyroxine (SYNTHROID) 50 MCG tablet TAKE 1/2 TABLET ONCE A DAY IN THE MORNING ON AN EMPTY STOMACH.   magnesium gluconate (MAGONATE) 500 MG tablet Take 500 mg by mouth daily.   metoprolol tartrate (LOPRESSOR) 25 MG tablet Take 0.5 tablets (12.5 mg total) by mouth 2 (two) times daily. Needs appt   Potassium 99 MG TABS Take by mouth.   rosuvastatin (CRESTOR) 10 MG tablet TAKE 1 TABLET EACH DAY.   XARELTO 20 MG TABS tablet Take 1 tablet (20 mg total) by mouth daily. Needs appt   zolpidem (AMBIEN) 10 MG tablet Take 1 tablet (10 mg total) by mouth at bedtime as needed for sleep.   No facility-administered encounter medications on file as of 08/03/2021.    Reviewed chart prior to disease state call. Spoke with patient regarding BP  Recent Office Vitals: BP Readings from Last 3 Encounters:  07/07/21 126/82  06/21/21 114/64  12/21/20 140/60   Pulse Readings from Last 3 Encounters:  07/07/21 62  06/21/21 (!) 59  12/21/20 63    Wt Readings from Last 3 Encounters:  07/07/21 188 lb (85.3 kg)  06/21/21 186 lb 9.6 oz (84.6 kg)  12/21/20 199 lb 12.8 oz (90.6 kg)     Kidney Function Lab Results  Component Value Date/Time   CREATININE 0.98 07/07/2021 11:15 AM   CREATININE 0.84 12/21/2020 10:24 AM   CREATININE  0.95 (H) 07/06/2020 09:47 AM   GFR 54.30 (L) 07/07/2021 11:15 AM   GFRNONAA >60 12/21/2020 10:24 AM   GFRAA >60 01/08/2020 10:53 AM    BMP Latest Ref Rng & Units 07/07/2021 12/21/2020 07/06/2020  Glucose 70 - 99 mg/dL 91 100(H) 93  BUN 6 - 23 mg/dL 19 18 28(H)  Creatinine 0.40 - 1.20 mg/dL 0.98 0.84 0.95(H)  BUN/Creat Ratio 6 - 22 (calc) - - 29(H)  Sodium 135 - 145 mEq/L 139 141 143  Potassium 3.5 - 5.1 mEq/L 4.2 4.4 4.1  Chloride 96 - 112 mEq/L 103 107 107  CO2 19 - 32 mEq/L '29 26 21  '$ Calcium 8.4 - 10.5 mg/dL 9.6 9.4 9.7    Current antihypertensive regimen:   Metoprolol tartrate 12.5 mg (1/2 of 50 mg) twice a day  How often are you checking your Blood Pressure?  3-5x per week  Current home BP readings:   Patient states she does not record her readings, her BP has been in normal range.  What recent interventions/DTPs have been made by any provider to improve Blood Pressure control since last CPP Visit:  None listed  Any recent hospitalizations or ED visits since last visit with CPP? No  What diet changes have been made to improve Blood Pressure Control?  Patient states she uses Weight watchers and has been losing weight.  What exercise is being done to improve your Blood Pressure Control?  Patient states she does Water aerobics three times a week & tries to walk 10,000 steps a day.,   Adherence Review: Is the patient currently on ACE/ARB medication? No Does the patient have >5 day gap between last estimated fill dates? No  Metoprolol tartrate - last fill 06/21/21 90D  Star Rating Drugs: Rosuvastatin - 06/21/21 [place], RMA Clinical Pharmacists Assistant [phone]  Time Spent: 33

## 2021-09-09 ENCOUNTER — Other Ambulatory Visit: Payer: Self-pay | Admitting: Internal Medicine

## 2021-09-09 DIAGNOSIS — E039 Hypothyroidism, unspecified: Secondary | ICD-10-CM

## 2021-09-09 DIAGNOSIS — E785 Hyperlipidemia, unspecified: Secondary | ICD-10-CM

## 2021-11-01 ENCOUNTER — Ambulatory Visit (INDEPENDENT_AMBULATORY_CARE_PROVIDER_SITE_OTHER): Payer: Medicare Other

## 2021-11-01 ENCOUNTER — Other Ambulatory Visit: Payer: Self-pay

## 2021-11-01 VITALS — BP 124/72 | HR 73 | Temp 97.8°F | Ht 66.0 in | Wt 180.8 lb

## 2021-11-01 DIAGNOSIS — Z0000 Encounter for general adult medical examination without abnormal findings: Secondary | ICD-10-CM | POA: Diagnosis not present

## 2021-11-01 NOTE — Patient Instructions (Signed)
[name] , Thank you for taking time to come for your Medicare Wellness Visit. I appreciate your ongoing commitment to your health goals. Please review the following plan we discussed and let me know if I can assist you in the future.   Screening recommendations/referrals: Colonoscopy: Not a candidate for screening due to age 80: 07/04/2021; due every 1-2 years Bone Density: never done Recommended yearly ophthalmology/optometry visit for glaucoma screening and checkup Recommended yearly dental visit for hygiene and checkup  Vaccinations: Influenza vaccine: 10/11/2021 Pneumococcal vaccine: 11/10/2015, 01/05/2020 Tdap vaccine: 07/03/2019; due every 10 years Shingles vaccine: 08/04/2020, 10/28/2020 Covid-19: 01/15/2020, 02/05/2020, 10/05/2020, 06/07/2021  Advanced directives: Please bring a copy of your health care power of attorney and living will to the office at your convenience.  Conditions/risks identified: Yes; Client understands the importance of follow-up with providers by attending scheduled visits and discussed goals to eat healthier, increase physical activity, exercise the brain, socialize more, get enough sleep and make time for laughter.  Next appointment: Please schedule your next Medicare Wellness Visit with your Nurse Health Advisor in 1 year by calling [phone].   Preventive Care 81 Years and Older, Female Preventive care refers to lifestyle choices and visits with your health care provider that can promote health and wellness. What does preventive care include? A yearly physical exam. This is also called an annual well check. Dental exams once or twice a year. Routine eye exams. Ask your health care provider how often you should have your eyes checked. Personal lifestyle choices, including: Daily care of your teeth and gums. Regular physical activity. Eating a healthy diet. Avoiding tobacco and drug use. Limiting alcohol use. Practicing safe sex. Taking  low-dose aspirin every day. Taking vitamin and mineral supplements as recommended by your health care provider. What happens during an annual well check? The services and screenings done by your health care provider during your annual well check will depend on your age, overall health, lifestyle risk factors, and family history of disease. Counseling  Your health care provider may ask you questions about your: Alcohol use. Tobacco use. Drug use. Emotional well-being. Home and relationship well-being. Sexual activity. Eating habits. History of falls. Memory and ability to understand (cognition). Work and work Statistician. Reproductive health. Screening  You may have the following tests or measurements: Height, weight, and BMI. Blood pressure. Lipid and cholesterol levels. These may be checked every 5 years, or more frequently if you are over 69 years old. Skin check. Lung cancer screening. You may have this screening every year starting at age 81 if you have a 30-pack-year history of smoking and currently smoke or have quit within the past 15 years. Fecal occult blood test (FOBT) of the stool. You may have this test every year starting at age 81. Flexible sigmoidoscopy or colonoscopy. You may have a sigmoidoscopy every 5 years or a colonoscopy every 10 years starting at age 81. Hepatitis C blood test. Hepatitis B blood test. Sexually transmitted disease (STD) testing. Diabetes screening. This is done by checking your blood sugar (glucose) after you have not eaten for a while (fasting). You may have this done every 1-81 years. Bone density scan. This is done to screen for osteoporosis. You may have this done starting at age 81. Mammogram. This may be done every 1-2 years. Talk to your health care provider about how often you should have regular mammograms. Talk with your health care provider about your test results, treatment options, and if necessary, the need for more tests.  Vaccines   Your health care provider may recommend certain vaccines, such as: Influenza vaccine. This is recommended every year. Tetanus, diphtheria, and acellular pertussis (Tdap, Td) vaccine. You may need a Td booster every 10 years. Zoster vaccine. You may need this after age 81. Pneumococcal 13-valent conjugate (PCV13) vaccine. One dose is recommended after age 81. Pneumococcal polysaccharide (PPSV23) vaccine. One dose is recommended after age 81. Talk to your health care provider about which screenings and vaccines you need and how often you need them. This information is not intended to replace advice given to you by your health care provider. Make sure you discuss any questions you have with your health care provider. Document Released: 01/07/2016 Document Revised: 08/30/2016 Document Reviewed: 10/12/2015 Elsevier Interactive Patient Education  2017 [place] Prevention in the Home Falls can cause injuries. They can happen to people of all ages. There are many things you can do to make your home safe and to help prevent falls. What can I do on the outside of my home? Regularly fix the edges of walkways and driveways and fix any cracks. Remove anything that might make you trip as you walk through a door, such as a raised step or threshold. Trim any bushes or trees on the path to your home. Use bright outdoor lighting. Clear any walking paths of anything that might make someone trip, such as rocks or tools. Regularly check to see if handrails are loose or broken. Make sure that both sides of any steps have handrails. Any raised decks and porches should have guardrails on the edges. Have any leaves, snow, or ice cleared regularly. Use sand or salt on walking paths during winter. Clean up any spills in your garage right away. This includes oil or grease spills. What can I do in the bathroom? Use night lights. Install grab bars by the toilet and in the tub and shower. Do not use towel  bars as grab bars. Use non-skid mats or decals in the tub or shower. If you need to sit down in the shower, use a plastic, non-slip stool. Keep the floor dry. Clean up any water that spills on the floor as soon as it happens. Remove soap buildup in the tub or shower regularly. Attach bath mats securely with double-sided non-slip rug tape. Do not have throw rugs and other things on the floor that can make you trip. What can I do in the bedroom? Use night lights. Make sure that you have a light by your bed that is easy to reach. Do not use any sheets or blankets that are too big for your bed. They should not hang down onto the floor. Have a firm chair that has side arms. You can use this for support while you get dressed. Do not have throw rugs and other things on the floor that can make you trip. What can I do in the kitchen? Clean up any spills right away. Avoid walking on wet floors. Keep items that you use a lot in easy-to-reach places. If you need to reach something above you, use a strong step stool that has a grab bar. Keep electrical cords out of the way. Do not use floor polish or wax that makes floors slippery. If you must use wax, use non-skid floor wax. Do not have throw rugs and other things on the floor that can make you trip. What can I do with my stairs? Do not leave any items on the stairs. Make sure that  there are handrails on both sides of the stairs and use them. Fix handrails that are broken or loose. Make sure that handrails are as long as the stairways. Check any carpeting to make sure that it is firmly attached to the stairs. Fix any carpet that is loose or worn. Avoid having throw rugs at the top or bottom of the stairs. If you do have throw rugs, attach them to the floor with carpet tape. Make sure that you have a light switch at the top of the stairs and the bottom of the stairs. If you do not have them, ask someone to add them for you. What else can I do to help  prevent falls? Wear shoes that: Do not have high heels. Have rubber bottoms. Are comfortable and fit you well. Are closed at the toe. Do not wear sandals. If you use a stepladder: Make sure that it is fully opened. Do not climb a closed stepladder. Make sure that both sides of the stepladder are locked into place. Ask someone to hold it for you, if possible. Clearly mark and make sure that you can see: Any grab bars or handrails. First and last steps. Where the edge of each step is. Use tools that help you move around (mobility aids) if they are needed. These include: Canes. Walkers. Scooters. Crutches. Turn on the lights when you go into a dark area. Replace any light bulbs as soon as they burn out. Set up your furniture so you have a clear path. Avoid moving your furniture around. If any of your floors are uneven, fix them. If there are any pets around you, be aware of where they are. Review your medicines with your doctor. Some medicines can make you feel dizzy. This can increase your chance of falling. Ask your doctor what other things that you can do to help prevent falls. This information is not intended to replace advice given to you by your health care provider. Make sure you discuss any questions you have with your health care provider. Document Released: 10/07/2009 Document Revised: 05/18/2016 Document Reviewed: 01/15/2015 Elsevier Interactive Patient Education  2017 [employer].
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
## 2021-12-09 ENCOUNTER — Other Ambulatory Visit: Payer: Self-pay | Admitting: Internal Medicine

## 2021-12-09 DIAGNOSIS — E785 Hyperlipidemia, unspecified: Secondary | ICD-10-CM

## 2021-12-09 DIAGNOSIS — E039 Hypothyroidism, unspecified: Secondary | ICD-10-CM

## 2021-12-09 NOTE — Telephone Encounter (Signed)
This is a A-Fib clinic pt 

## 2021-12-20 ENCOUNTER — Ambulatory Visit (INDEPENDENT_AMBULATORY_CARE_PROVIDER_SITE_OTHER): Payer: Medicare Other | Admitting: Internal Medicine

## 2021-12-20 ENCOUNTER — Encounter: Payer: Self-pay | Admitting: Internal Medicine

## 2021-12-20 ENCOUNTER — Other Ambulatory Visit: Payer: Self-pay

## 2021-12-20 VITALS — BP 126/80 | HR 66 | Ht 66.0 in | Wt 183.8 lb

## 2021-12-20 DIAGNOSIS — I441 Atrioventricular block, second degree: Secondary | ICD-10-CM | POA: Diagnosis not present

## 2021-12-20 DIAGNOSIS — I4892 Unspecified atrial flutter: Secondary | ICD-10-CM

## 2021-12-20 DIAGNOSIS — I4819 Other persistent atrial fibrillation: Secondary | ICD-10-CM | POA: Diagnosis not present

## 2021-12-20 NOTE — Patient Instructions (Addendum)
Medication Instructions:  Your physician recommends that you continue on your current medications as directed. Please refer to the Current Medication list given to you today. *If you need a refill on your cardiac medications before your next appointment, please call your pharmacy*  Lab Work: None. If you have labs (blood work) drawn today and your tests are completely normal, you will receive your results only by: [place] (if you have MyChart) OR A paper copy in the mail If you have any lab test that is abnormal or we need to change your treatment, we will call you to review the results.  Testing/Procedures: None.  Follow-Up: At [facility], you and your health needs are our priority.  As part of our continuing mission to provide you with exceptional heart care, we have created designated Provider Care Teams.  These Care Teams include your primary Cardiologist (physician) and Advanced Practice Providers (APPs -  Physician Assistants and Nurse Practitioners) who all work together to provide you with the care you need, when you need it.  Your physician wants you to follow-up in: 6 months with the Afib Clinic. They will contact you to schedule.    You will receive a reminder letter in the mail two months in advance. If you don't receive a letter, please call our office to schedule the follow-up appointment.  We recommend signing up for the patient portal called "MyChart".  Sign up information is provided on this After Visit Summary.  MyChart is used to connect with patients for Virtual Visits (Telemedicine).  Patients are able to view lab/test results, encounter notes, upcoming appointments, etc.  Non-urgent messages can be sent to your provider as well.   To learn more about what you can do with MyChart, go to [email or web address].    Any Other Special Instructions Will Be Listed Below (If Applicable).
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
## 2022-01-10 ENCOUNTER — Ambulatory Visit: Payer: Medicare Other | Admitting: Internal Medicine

## 2022-01-11 ENCOUNTER — Other Ambulatory Visit: Payer: Self-pay | Admitting: Internal Medicine

## 2022-01-11 DIAGNOSIS — E785 Hyperlipidemia, unspecified: Secondary | ICD-10-CM

## 2022-01-12 ENCOUNTER — Encounter (HOSPITAL_COMMUNITY): Payer: Self-pay | Admitting: Cardiology

## 2022-01-17 ENCOUNTER — Other Ambulatory Visit (HOSPITAL_COMMUNITY): Payer: Self-pay | Admitting: *Deleted

## 2022-01-18 ENCOUNTER — Encounter: Payer: Self-pay | Admitting: Internal Medicine

## 2022-01-18 ENCOUNTER — Ambulatory Visit (INDEPENDENT_AMBULATORY_CARE_PROVIDER_SITE_OTHER): Payer: Medicare Other | Admitting: Internal Medicine

## 2022-01-18 ENCOUNTER — Other Ambulatory Visit: Payer: Self-pay

## 2022-01-18 VITALS — BP 136/84 | HR 67 | Temp 97.9°F | Ht 66.0 in | Wt 177.0 lb

## 2022-01-18 DIAGNOSIS — E039 Hypothyroidism, unspecified: Secondary | ICD-10-CM

## 2022-01-18 DIAGNOSIS — D539 Nutritional anemia, unspecified: Secondary | ICD-10-CM | POA: Diagnosis not present

## 2022-01-18 DIAGNOSIS — N1831 Chronic kidney disease, stage 3a: Secondary | ICD-10-CM | POA: Diagnosis not present

## 2022-01-18 DIAGNOSIS — E559 Vitamin D deficiency, unspecified: Secondary | ICD-10-CM | POA: Diagnosis not present

## 2022-01-18 DIAGNOSIS — D508 Other iron deficiency anemias: Secondary | ICD-10-CM | POA: Diagnosis not present

## 2022-01-18 LAB — HEPATIC FUNCTION PANEL
ALT: 16 U/L (ref 0–35)
AST: 22 U/L (ref 0–37)
Albumin: 4.3 g/dL (ref 3.5–5.2)
Alkaline Phosphatase: 45 U/L (ref 39–117)
Bilirubin, Direct: 0.2 mg/dL (ref 0.0–0.3)
Total Bilirubin: 0.6 mg/dL (ref 0.2–1.2)
Total Protein: 7.4 g/dL (ref 6.0–8.3)

## 2022-01-18 LAB — CBC WITH DIFFERENTIAL/PLATELET
Basophils Absolute: 0 10*3/uL (ref 0.0–0.1)
Basophils Relative: 0.7 % (ref 0.0–3.0)
Eosinophils Absolute: 0.1 10*3/uL (ref 0.0–0.7)
Eosinophils Relative: 2.4 % (ref 0.0–5.0)
HCT: 38.5 % (ref 36.0–46.0)
Hemoglobin: 12.8 g/dL (ref 12.0–15.0)
Lymphocytes Relative: 17.3 % (ref 12.0–46.0)
Lymphs Abs: 0.9 10*3/uL (ref 0.7–4.0)
MCHC: 33.2 g/dL (ref 30.0–36.0)
MCV: 90 fl (ref 78.0–100.0)
Monocytes Absolute: 0.4 10*3/uL (ref 0.1–1.0)
Monocytes Relative: 7.2 % (ref 3.0–12.0)
Neutro Abs: 3.9 10*3/uL (ref 1.4–7.7)
Neutrophils Relative %: 72.4 % (ref 43.0–77.0)
Platelets: 297 10*3/uL (ref 150.0–400.0)
RBC: 4.28 Mil/uL (ref 3.87–5.11)
RDW: 13.3 % (ref 11.5–15.5)
WBC: 5.4 10*3/uL (ref 4.0–10.5)

## 2022-01-18 LAB — IBC + FERRITIN
Ferritin: 90.7 ng/mL (ref 10.0–291.0)
Iron: 49 ug/dL (ref 42–145)
Saturation Ratios: 15.8 % — ABNORMAL LOW (ref 20.0–50.0)
TIBC: 309.4 ug/dL (ref 250.0–450.0)
Transferrin: 221 mg/dL (ref 212.0–360.0)

## 2022-01-18 LAB — TSH: TSH: 2.82 u[IU]/mL (ref 0.35–5.50)

## 2022-01-18 LAB — FOLATE: Folate: 17.6 ng/mL (ref >5.9)

## 2022-01-18 LAB — VITAMIN B12: Vitamin B-12: 277 pg/mL (ref 211–911)

## 2022-01-18 LAB — VITAMIN D 25 HYDROXY (VIT D DEFICIENCY, FRACTURES): VITD: 52.89 ng/mL (ref 30.00–100.00)

## 2022-01-18 NOTE — Progress Notes (Signed)
Subjective:  Patient ID: [name], female    DOB: 1941-10-31  Age: 81 y.o. MRN: [id number]  CC: Anemia and Hypothyroidism  This visit occurred during the SARS-CoV-2 public health emergency.  Safety protocols were in place, including screening questions prior to the visit, additional usage of staff PPE, and extensive cleaning of exam room while observing appropriate contact time as indicated for disinfecting solutions.    HPI Karrissa Parchment presents for f/up - She feels well.  Offers no complaints.  She has intentionally lost weight with weight watchers.  Outpatient Medications Prior to Visit  Medication Sig Dispense Refill   Calcium Carbonate-Vit D-Min (CALCIUM 1200 PO) Take by mouth.     flecainide (TAMBOCOR) 50 MG tablet TAKE 1/2 TABLET TWICE DAILY (PLEASE KEEP SCHEDULED APPOINTMENT FOR DECEMBER) 90 tablet 0   levothyroxine (SYNTHROID) 50 MCG tablet TAKE 1/2 TABLET ONCE DAILY EVERY MORNING ON AN EMPTY STOMACH 45 tablet 0   magnesium gluconate (MAGONATE) 500 MG tablet Take 500 mg by mouth daily.     Melatonin 10 MG TABS Take 1 tablet by mouth.     metoprolol tartrate (LOPRESSOR) 25 MG tablet Take 0.5 tablets (12.5 mg total) by mouth 2 (two) times daily. Absolute last refill without office visit please call [phone] 30 tablet 0   Potassium 99 MG TABS Take by mouth.     rivaroxaban (XARELTO) 20 MG TABS tablet Take 1 tablet (20 mg total) by mouth daily with supper. Absolute last refill without office visit please call [phone] 30 tablet 0   rosuvastatin (CRESTOR) 10 MG tablet Take 1 tablet (10 mg total) by mouth daily. Absolute last refill without office visit please call [phone] 30 tablet 0   zolpidem (AMBIEN) 10 MG tablet Take 1 tablet (10 mg total) by mouth at bedtime as needed for sleep. 90 tablet 1   No facility-administered medications prior to visit.    ROS Review of Systems  Constitutional:  Negative for diaphoresis, fatigue and unexpected weight  change.  HENT: Negative.    Eyes: Negative.   Respiratory:  Negative for cough and shortness of breath.   Cardiovascular:  Negative for chest pain, palpitations and leg swelling.  Gastrointestinal:  Negative for abdominal pain, diarrhea, nausea and vomiting.  Endocrine: Negative.   Genitourinary: Negative.  Negative for difficulty urinating, dysuria and hematuria.  Musculoskeletal:  Negative for arthralgias and myalgias.  Skin: Negative.   Neurological:  Negative for dizziness, weakness, light-headedness, numbness and headaches.  Hematological:  Negative for adenopathy. Does not bruise/bleed easily.  Psychiatric/Behavioral: Negative.     Objective:  BP 136/84 (BP Location: Left Arm, Patient Position: Sitting, Cuff Size: Large)    Pulse 67    Temp 97.9 F (36.6 C) (Oral)    Ht 5\' 6"  (1.676 m)    Wt 177 lb (80.3 kg)    SpO2 97%    BMI 28.57 kg/m   BP Readings from Last 3 Encounters:  01/18/22 136/84  12/20/21 126/80  11/01/21 124/72    Wt Readings from Last 3 Encounters:  01/18/22 177 lb (80.3 kg)  12/20/21 183 lb 12.8 oz (83.4 kg)  11/01/21 180 lb 12.8 oz (82 kg)      Lab Results  Component Value Date   WBC 5.4 01/18/2022   HGB 12.8 01/18/2022   HCT 38.5 01/18/2022   PLT 297.0 01/18/2022   GLUCOSE 91 07/07/2021   CHOL 145 07/07/2021   TRIG 56.0 07/07/2021   HDL 60.20 07/07/2021   LDLDIRECT 127.1 07/08/2013  LDLCALC 74 07/07/2021   ALT 16 01/18/2022   AST 22 01/18/2022   NA 139 07/07/2021   K 4.2 07/07/2021   CL 103 07/07/2021   CREATININE 0.98 07/07/2021   BUN 19 07/07/2021   CO2 29 07/07/2021   TSH 2.82 01/18/2022   INR 1.4 (H) 07/03/2019   HGBA1C 5.5 03/13/2008   MICROALBUR 0.8 12/03/2006    MM 3D SCREEN BREAST BILATERAL  Result Date: 07/07/2021 CLINICAL DATA:  Screening. EXAM: DIGITAL SCREENING BILATERAL MAMMOGRAM WITH TOMOSYNTHESIS AND CAD TECHNIQUE: Bilateral screening digital craniocaudal and mediolateral oblique mammograms were obtained. Bilateral  screening digital breast tomosynthesis was performed. The images were evaluated with computer-aided detection. COMPARISON:  Previous exam(s). ACR Breast Density Category b: There are scattered areas of fibroglandular density. FINDINGS: There are no findings suspicious for malignancy. IMPRESSION: No mammographic evidence of malignancy. A result letter of this screening mammogram will be mailed directly to the patient. RECOMMENDATION: Screening mammogram in one year. (Code:SM-B-01Y) BI-RADS CATEGORY  1: Negative. Electronically Signed   By: Marin Olp M.D.   On: 07/07/2021 08:50    Assessment & Plan:   Tajia was seen today for anemia and hypothyroidism.  Diagnoses and all orders for this visit:  Deficiency anemia- Her H&H are normal now.  Vitamin levels are normal. -     IBC + Ferritin; Future -     Lactate dehydrogenase; Future -     Reticulocytes; Future -     Vitamin B12; Future -     Folate; Future -     Zinc; Future -     Vitamin B1; Future -     Vitamin B1 -     Zinc -     Folate -     Vitamin B12 -     Reticulocytes -     Lactate dehydrogenase -     IBC + Ferritin  Acquired hypothyroidism- Her TSH is in the normal range.  She will stay on the current T4 dosage. -     TSH; Future -     TSH  Iron deficiency anemia secondary to inadequate dietary iron intake- Her H&H and iron levels are normal now. -     IBC + Ferritin; Future -     CBC with Differential/Platelet; Future -     CBC with Differential/Platelet -     IBC + Ferritin  Stage 3a chronic kidney disease ([place])- She will avoid nephrotoxic agents. -     Hepatic function panel; Future -     Hepatic function panel  Vitamin D deficiency -     VITAMIN D 25 Hydroxy (Vit-D Deficiency, Fractures); Future -     VITAMIN D 25 Hydroxy (Vit-D Deficiency, Fractures)   I am having Twala E. Mitnick maintain her Calcium Carbonate-Vit D-Min (CALCIUM 1200 PO), magnesium gluconate, Potassium, zolpidem, Melatonin, levothyroxine,  flecainide, metoprolol tartrate, rivaroxaban, and rosuvastatin.  No orders of the defined types were placed in this encounter.    Follow-up: No follow-ups on file.  Scarlette Calico, MD

## 2022-01-22 LAB — LACTATE DEHYDROGENASE: LDH: 175 U/L (ref 120–250)

## 2022-01-22 LAB — VITAMIN B1: Vitamin B1 (Thiamine): 9 nmol/L (ref 8–30)

## 2022-01-22 LAB — RETICULOCYTES
ABS Retic: 39060 cells/uL (ref 20000–80000)
Retic Ct Pct: 0.9 %

## 2022-01-22 LAB — ZINC: Zinc: 70 ug/dL (ref 60–130)

## 2022-01-23 ENCOUNTER — Encounter: Payer: Self-pay | Admitting: Internal Medicine

## 2022-02-09 ENCOUNTER — Other Ambulatory Visit: Payer: Self-pay | Admitting: Internal Medicine

## 2022-02-09 DIAGNOSIS — F5104 Psychophysiologic insomnia: Secondary | ICD-10-CM

## 2022-02-10 ENCOUNTER — Other Ambulatory Visit (HOSPITAL_COMMUNITY): Payer: Self-pay | Admitting: *Deleted

## 2022-02-10 MED ORDER — FLECAINIDE ACETATE 50 MG PO TABS
ORAL_TABLET | ORAL | 1 refills | Status: DC
Start: 2022-02-10 — End: 2022-03-10

## 2022-02-10 MED ORDER — METOPROLOL TARTRATE 25 MG PO TABS: 12.5000 mg | ORAL_TABLET | Freq: Two times a day (BID) | ORAL | 0 refills | Status: DC

## 2022-02-10 MED ORDER — RIVAROXABAN 20 MG PO TABS: 20.0000 mg | ORAL_TABLET | Freq: Every day | ORAL | 0 refills | Status: DC

## 2022-02-19 ENCOUNTER — Other Ambulatory Visit: Payer: Self-pay | Admitting: Internal Medicine

## 2022-02-19 DIAGNOSIS — E785 Hyperlipidemia, unspecified: Secondary | ICD-10-CM

## 2022-03-02 ENCOUNTER — Other Ambulatory Visit (HOSPITAL_COMMUNITY): Payer: Self-pay | Admitting: Internal Medicine

## 2022-03-10 ENCOUNTER — Ambulatory Visit (HOSPITAL_COMMUNITY)
Admission: RE | Admit: 2022-03-10 | Discharge: 2022-03-10 | Disposition: A | Discharge status: Home / Self Care | Payer: Medicare Other | Source: Ambulatory Visit | Attending: Internal Medicine | Admitting: Internal Medicine

## 2022-03-10 ENCOUNTER — Encounter (HOSPITAL_COMMUNITY): Payer: Self-pay | Admitting: Internal Medicine

## 2022-03-10 ENCOUNTER — Other Ambulatory Visit: Payer: Self-pay

## 2022-03-10 VITALS — BP 134/90 | HR 63 | Wt 190.0 lb

## 2022-03-10 DIAGNOSIS — E669 Obesity, unspecified: Secondary | ICD-10-CM | POA: Insufficient documentation

## 2022-03-10 DIAGNOSIS — Z86718 Personal history of other venous thrombosis and embolism: Secondary | ICD-10-CM | POA: Insufficient documentation

## 2022-03-10 DIAGNOSIS — E785 Hyperlipidemia, unspecified: Secondary | ICD-10-CM | POA: Diagnosis not present

## 2022-03-10 DIAGNOSIS — I447 Left bundle-branch block, unspecified: Secondary | ICD-10-CM | POA: Insufficient documentation

## 2022-03-10 DIAGNOSIS — Z86711 Personal history of pulmonary embolism: Secondary | ICD-10-CM | POA: Diagnosis not present

## 2022-03-10 DIAGNOSIS — Z79899 Other long term (current) drug therapy: Secondary | ICD-10-CM | POA: Insufficient documentation

## 2022-03-10 DIAGNOSIS — I5022 Chronic systolic (congestive) heart failure: Secondary | ICD-10-CM | POA: Insufficient documentation

## 2022-03-10 DIAGNOSIS — I480 Paroxysmal atrial fibrillation: Secondary | ICD-10-CM | POA: Diagnosis not present

## 2022-03-10 DIAGNOSIS — Z7901 Long term (current) use of anticoagulants: Secondary | ICD-10-CM | POA: Insufficient documentation

## 2022-03-10 MED ORDER — METOPROLOL TARTRATE 25 MG PO TABS: 12.5000 mg | ORAL_TABLET | Freq: Two times a day (BID) | ORAL | 3 refills | Status: DC

## 2022-03-10 MED ORDER — ROSUVASTATIN CALCIUM 10 MG PO TABS: 10.0000 mg | ORAL_TABLET | Freq: Every day | ORAL | 3 refills | Status: DC

## 2022-03-10 MED ORDER — FLECAINIDE ACETATE 50 MG PO TABS: ORAL_TABLET | ORAL | 3 refills | Status: DC

## 2022-03-10 MED ORDER — RIVAROXABAN 20 MG PO TABS: 20.0000 mg | ORAL_TABLET | Freq: Every day | ORAL | 3 refills | Status: DC

## 2022-03-10 NOTE — Addendum Note (Signed)
Encounter addended by: Jerl Mina, RN on: 03/10/2022 3:23 PM ? Actions taken: Order list changed, Diagnosis association updated, Clinical Note Signed

## 2022-03-10 NOTE — Patient Instructions (Signed)
There has been no changes to your medications. ? ?Your physician recommends that you schedule a follow-up appointment in: 1 year (March 2024) **please call the office in December 2023 to arrange your follow up appointment. ? ?If you have any questions or concerns before your next appointment please send [place] a message through [place] or call our office at [phone].   ? ?TO LEAVE A MESSAGE FOR THE NURSE SELECT OPTION 2, PLEASE LEAVE A MESSAGE INCLUDING: ?YOUR NAME ?DATE OF BIRTH ?CALL BACK NUMBER ?REASON FOR CALL**this is important as we prioritize the call backs ? ?YOU WILL RECEIVE A CALL BACK THE SAME DAY AS LONG AS YOU CALL BEFORE 4:00 PM ? ?At the [facility], you and your health needs are our priority. As part of our continuing mission to provide you with exceptional heart care, we have created designated Provider Care Teams. These Care Teams include your primary Cardiologist (physician) and Advanced Practice Providers (APPs- Physician Assistants and Nurse Practitioners) who all work together to provide you with the care you need, when you need it.  ? ?You may see any of the following providers on your designated Care Team at your next follow up: ?Dr Glori Bickers ?Dr Loralie Champagne ?Darrick Grinder, NP ?Lyda Jester, PA ?Jessica Milford,NP ?Marlyce Huge, PA ?Audry Riles, PharmD ? ? ?Please be sure to bring in all your medications bottles to every appointment.  ? ? ?

## 2022-03-10 NOTE — Progress Notes (Signed)
Patient ID: [name], female   DOB: 1941/04/11, 81 y.o.   MRN: [id number] ? ?  ?Advanced Heart Failure Clinic Note  ? ? ?PCP: Janith Lima, MD ? ?HPI: ? ?[name] is a 81 y/o woman with h/o of obesity, PAF and AFL s/p AFL ablation.  ? ?She was admitted in 11/13 with acute bilateral PE, rapid AF and heart failure after a long plane trip. .EF 40-45%. ? ?On 10/28/12 Unsuccessful DCCV . Loaded amio and underwent repeat DC-CV on 11/13. Maintained on Xarelto. In 11/13 had AFL.  Amio increased to 400 bid. On 12/10/12 successful DC-CV.  Amiodarone was eventually stopped.  ? ?04/22/13 ECHO EF 65%, mild LVH, normal RV size and systolic function, PA systolic pressure 35 mmHg .Had recurrent AF in 9/15. Amiodarone was restarted and she was cardioverted to NSR on 09/14/14. Seen again on Oct 16. Asymptomatic. Was back in AFL with RVR at 150. Underwent DC-CV but reverted to AFL. S/p A flutter ablation 12/15/2014. Was seen by [name] in follow up last 01/08/15 and was stable at that time with no symptoms. She was in NSR at that time. Metoprolol stopped.  ? ?Admitted 10/31/16-11/02/16 with SOB, AFib. Started on flecainide.  ? ?Echo 12/19 EF 55-60%  ? ?In 9/20 we saw her for unexplained dyspnea. REDS 31% Zio patch ok (no AF). Cardiac CT mild plaque prox LAD otherwise normal.  ? ?Today she presents for routine f/u. Just got back from 3 weeks in [place]. No recurrent AF. Continues on flecainide and Xarelto. Walks 10-15K steps per day. No CP or SOB.  ? ?ROS: All systems negative except as listed in HPI, PMH and Problem List. ? ?Past Medical History:  ?Diagnosis Date  ? 1st degree AV block   ? Atrial flutter ([place])   ? a. s/p CTI ablation 11/2014  ? Cataract   ? Diverticulosis 2002  ? Monica. [place] Plan  ? DVT (deep venous thrombosis) ([place]) 2013  ? "S/P a long airplane ride"  ? Endometrial polyp   ? Fasting hyperglycemia   ? A1C  within normal limits  ? Hyperlipidemia   ? Hypothyroidism   ? LBBB (left bundle branch block)   ? LVH (left  ventricular hypertrophy) 2014  ? Mild, Noted on ECHO  ? Obese   ? Pancreatitis due to common bile duct stone   ? Persistent atrial fibrillation ([name])   ? RVR  ? Personal history of colonic polyps 2002  ? tubular adenoma  ? PMB (postmenopausal bleeding)   ? Pulmonary embolism ([name]) 2013  ? "S/P a long airplane ride"  ? Sinus bradycardia   ? Tortuous colon   ? Vitamin D deficiency   ? Wears glasses   ? ? ?Current Outpatient Medications  ?Medication Sig Dispense Refill  ? Calcium Carbonate-Vit D-Min (CALCIUM 1200 PO) Take by mouth.    ? flecainide (TAMBOCOR) 50 MG tablet TAKE 1/2 TABLET TWICE DAILY 90 tablet 1  ? levothyroxine (SYNTHROID) 50 MCG tablet TAKE 1/2 TABLET ONCE DAILY EVERY MORNING ON AN EMPTY STOMACH 45 tablet 0  ? magnesium gluconate (MAGONATE) 500 MG tablet Take 500 mg by mouth daily.    ? Melatonin 10 MG TABS Take 1 tablet by mouth.    ? metoprolol tartrate (LOPRESSOR) 25 MG tablet Take 0.5 tablets (12.5 mg total) by mouth 2 (two) times daily. 30 tablet 0  ? Potassium 99 MG TABS Take by mouth.    ? rivaroxaban (XARELTO) 20 MG TABS tablet Take 1 tablet (20  mg total) by mouth daily with supper. Absolute last refill without office visit please call [phone] 30 tablet 0  ? rosuvastatin (CRESTOR) 10 MG tablet Take 1 tablet (10 mg total) by mouth daily. Absolute last refill without office visit please call [phone] 30 tablet 0  ? zolpidem (AMBIEN) 10 MG tablet Take 1 tablet (10 mg total) by mouth at bedtime as needed for sleep. 30 tablet 0  ? ?No current facility-administered medications for this encounter.  ? ?Vitals:  ? 03/10/22 1444  ?BP: 134/90  ?Pulse: 63  ?SpO2: 100%  ?Weight: 86.2 kg (190 lb)  ?  ?Wt Readings from Last 3 Encounters:  ?03/10/22 86.2 kg (190 lb)  ?01/18/22 80.3 kg (177 lb)  ?12/20/21 83.4 kg (183 lb 12.8 oz)  ? ? ?PHYSICAL EXAM: ?General:  Well appearing. No resp difficulty ?HEENT: normal ?Neck: supple. no JVD. Carotids 2+ bilat; no bruits. No lymphadenopathy or thryomegaly  appreciated. ?Cor: PMI nondisplaced. Regular rate & rhythm. No rubs, gallops or murmurs. ?Lungs: clear ?Abdomen: obese soft, nontender, nondistended. No hepatosplenomegaly. No bruits or masses. Good bowel sounds. ?Extremities: no cyanosis, clubbing, rash, edema ?Neuro: alert & orientedx3, cranial nerves grossly intact. moves all 4 extremities w/o difficulty. Affect pleasant ? ?EKG: SR 63 PR 158m bpm LBBB (144 Monica) Personally reviewed ? ? ?ASSESSMENT & PLAN: ? ?1.  Paroxysmal Afib RVR:  ?- Has had history of recurrent AFL despite DC-CV (x 2 in 2013 and x 1 in 2015), now, as above s/p AFL ablation by Monica ARayann Beck Long history of AF/AFL by EKGs.  Afib s/p DCCV in 09/22/16. ?- Recurrent AF in 11/17 now NSR on flecainide/metoprolol. ECG is ok.  ?- This patients CHA2DS2-VASc Score and unadjusted Ischemic Stroke Rate (% per year) is equal to 7.2 % stroke rate/year from a score of 5 Above score calculated as 1 point each if present [CHF, HTN, DM, Vascular=MI/PAD/Aortic Plaque, Age if 65-74, or Female], 2 points each if present [Age > 75, or Stroke/TIA/TE] ?- Continue Xarelto for anticogulation. No bleeding issues.  ? ?3) Hx of DVT and PEs ?- Continue Xarelto '20mg'$  daily No bleeding ? ?4) History of chronic systolic CHF: in the setting of acute PE.  ?- EF recovered 12/19 EF 55-60%   ?- Resolved ? ?5) LBBB ?- stable ? ? ?DGlori BickersMD ?3:06 PM ?03/10/2022  ? ? ? ? ? ?

## 2022-03-12 ENCOUNTER — Other Ambulatory Visit: Payer: Self-pay | Admitting: Internal Medicine

## 2022-03-12 DIAGNOSIS — E039 Hypothyroidism, unspecified: Secondary | ICD-10-CM

## 2022-04-06 DIAGNOSIS — H25811 Combined forms of age-related cataract, right eye: Secondary | ICD-10-CM | POA: Diagnosis not present

## 2022-04-06 DIAGNOSIS — Z961 Presence of intraocular lens: Secondary | ICD-10-CM | POA: Diagnosis not present

## 2022-04-06 DIAGNOSIS — H5213 Myopia, bilateral: Secondary | ICD-10-CM | POA: Diagnosis not present

## 2022-05-26 ENCOUNTER — Other Ambulatory Visit: Payer: Self-pay | Admitting: Internal Medicine

## 2022-05-26 DIAGNOSIS — F5104 Psychophysiologic insomnia: Secondary | ICD-10-CM

## 2022-06-06 ENCOUNTER — Other Ambulatory Visit: Payer: Self-pay | Admitting: Internal Medicine

## 2022-06-06 DIAGNOSIS — Z1231 Encounter for screening mammogram for malignant neoplasm of breast: Secondary | ICD-10-CM

## 2022-06-10 ENCOUNTER — Other Ambulatory Visit: Payer: Self-pay | Admitting: Internal Medicine

## 2022-06-10 DIAGNOSIS — E039 Hypothyroidism, unspecified: Secondary | ICD-10-CM

## 2022-06-14 ENCOUNTER — Other Ambulatory Visit: Payer: Self-pay

## 2022-06-14 DIAGNOSIS — I4819 Other persistent atrial fibrillation: Secondary | ICD-10-CM

## 2022-06-14 MED ORDER — RIVAROXABAN 20 MG PO TABS: 20.0000 mg | ORAL_TABLET | Freq: Every day | ORAL | 1 refills | Status: DC

## 2022-06-14 NOTE — Telephone Encounter (Signed)
Prescription refill request for Xarelto received.  Indication: DVT/PT Last office visit: 03/10/22 ([facility]) Weight: 86.2kg Age: 81 Scr: 0.98 (07/07/21) CrCl: 60.23nl/min  Appropriate dose and refill sent to requested pharmacy.

## 2022-06-15 ENCOUNTER — Other Ambulatory Visit (HOSPITAL_COMMUNITY): Payer: Self-pay

## 2022-06-15 DIAGNOSIS — I4819 Other persistent atrial fibrillation: Secondary | ICD-10-CM

## 2022-06-15 MED ORDER — RIVAROXABAN 20 MG PO TABS: 20.0000 mg | ORAL_TABLET | Freq: Every day | ORAL | 3 refills | Status: DC

## 2022-06-20 ENCOUNTER — Ambulatory Visit (HOSPITAL_COMMUNITY)
Admission: RE | Admit: 2022-06-20 | Discharge: 2022-06-20 | Disposition: A | Discharge status: Home / Self Care | Payer: Medicare Other | Source: Ambulatory Visit | Attending: Physician Assistant | Admitting: Physician Assistant

## 2022-06-20 ENCOUNTER — Encounter (HOSPITAL_COMMUNITY): Payer: Self-pay | Admitting: Physician Assistant

## 2022-06-20 VITALS — BP 130/80 | HR 63 | Ht 66.0 in | Wt 190.2 lb

## 2022-06-20 DIAGNOSIS — Z6830 Body mass index (BMI) 30.0-30.9, adult: Secondary | ICD-10-CM | POA: Diagnosis not present

## 2022-06-20 DIAGNOSIS — Z7901 Long term (current) use of anticoagulants: Secondary | ICD-10-CM | POA: Insufficient documentation

## 2022-06-20 DIAGNOSIS — I447 Left bundle-branch block, unspecified: Secondary | ICD-10-CM | POA: Diagnosis not present

## 2022-06-20 DIAGNOSIS — I4819 Other persistent atrial fibrillation: Secondary | ICD-10-CM | POA: Insufficient documentation

## 2022-06-20 DIAGNOSIS — Z79899 Other long term (current) drug therapy: Secondary | ICD-10-CM | POA: Insufficient documentation

## 2022-06-20 DIAGNOSIS — I440 Atrioventricular block, first degree: Secondary | ICD-10-CM | POA: Diagnosis not present

## 2022-06-20 DIAGNOSIS — D6869 Other thrombophilia: Secondary | ICD-10-CM | POA: Insufficient documentation

## 2022-06-20 DIAGNOSIS — Z86711 Personal history of pulmonary embolism: Secondary | ICD-10-CM | POA: Insufficient documentation

## 2022-07-06 ENCOUNTER — Ambulatory Visit
Admission: RE | Admit: 2022-07-06 | Discharge: 2022-07-06 | Disposition: A | Discharge status: Home / Self Care | Payer: Medicare Other | Source: Ambulatory Visit | Attending: Internal Medicine | Admitting: Internal Medicine

## 2022-07-06 DIAGNOSIS — Z1231 Encounter for screening mammogram for malignant neoplasm of breast: Secondary | ICD-10-CM

## 2022-07-31 ENCOUNTER — Other Ambulatory Visit: Payer: Self-pay | Admitting: Internal Medicine

## 2022-07-31 DIAGNOSIS — F5104 Psychophysiologic insomnia: Secondary | ICD-10-CM

## 2022-07-31 NOTE — Telephone Encounter (Signed)
Ok to PCP please 

## 2022-09-09 ENCOUNTER — Other Ambulatory Visit: Payer: Self-pay | Admitting: Internal Medicine

## 2022-09-09 DIAGNOSIS — E039 Hypothyroidism, unspecified: Secondary | ICD-10-CM

## 2022-09-26 ENCOUNTER — Other Ambulatory Visit: Payer: Self-pay | Admitting: Internal Medicine

## 2022-09-26 DIAGNOSIS — E039 Hypothyroidism, unspecified: Secondary | ICD-10-CM

## 2022-09-28 ENCOUNTER — Other Ambulatory Visit: Payer: Self-pay | Admitting: Internal Medicine

## 2022-09-28 DIAGNOSIS — E039 Hypothyroidism, unspecified: Secondary | ICD-10-CM

## 2022-10-02 ENCOUNTER — Telehealth: Payer: Self-pay | Admitting: Internal Medicine

## 2022-10-02 DIAGNOSIS — F5104 Psychophysiologic insomnia: Secondary | ICD-10-CM

## 2022-10-09 NOTE — Telephone Encounter (Signed)
MEDICATION: zolpidem (AMBIEN) 10 MG tablet // levothyroxine (SYNTHROID) 50 MCG tablet   PHARMACY: [facility], [place]  Comments: Patient is scheduled for annual on 11/22/22.   **Let patient know to contact pharmacy at the end of the day to make sure medication is ready. **  ** Please notify patient to allow 48-72 hours to process**  **Encourage patient to contact the pharmacy for refills or they can request refills through [facility]**

## 2022-10-10 ENCOUNTER — Other Ambulatory Visit: Payer: Self-pay | Admitting: Internal Medicine

## 2022-10-10 DIAGNOSIS — F5104 Psychophysiologic insomnia: Secondary | ICD-10-CM

## 2022-10-10 DIAGNOSIS — E039 Hypothyroidism, unspecified: Secondary | ICD-10-CM

## 2022-10-10 MED ORDER — ZOLPIDEM TARTRATE 10 MG PO TABS: 10.0000 mg | ORAL_TABLET | Freq: Every evening | ORAL | 2 refills | Status: DC | PRN

## 2022-10-10 MED ORDER — LEVOTHYROXINE SODIUM 50 MCG PO TABS: 25.0000 ug | ORAL_TABLET | Freq: Every day | ORAL | 0 refills | Status: DC

## 2022-10-26 ENCOUNTER — Telehealth: Payer: Self-pay | Admitting: Internal Medicine

## 2022-10-26 NOTE — Telephone Encounter (Signed)
Patient returned call and has been scheduled for AWV on 11/22/22 with health coach.

## 2022-10-26 NOTE — Telephone Encounter (Signed)
Left message for patient to call back to schedule Medicare Annual Wellness Visit   Last AWV  11/01/21  Please schedule at anytime with [facility] if patient calls the office back.     Any questions, please call me at [phone]
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
## 2022-11-21 NOTE — Patient Instructions (Signed)

## 2022-11-22 ENCOUNTER — Ambulatory Visit (INDEPENDENT_AMBULATORY_CARE_PROVIDER_SITE_OTHER): Payer: Medicare Other | Admitting: *Deleted

## 2022-11-22 ENCOUNTER — Encounter: Payer: Self-pay | Admitting: Internal Medicine

## 2022-11-22 ENCOUNTER — Ambulatory Visit (INDEPENDENT_AMBULATORY_CARE_PROVIDER_SITE_OTHER): Payer: Medicare Other | Admitting: Internal Medicine

## 2022-11-22 VITALS — BP 142/86 | HR 61 | Temp 97.7°F | Resp 16 | Ht 66.0 in | Wt 197.0 lb

## 2022-11-22 DIAGNOSIS — E039 Hypothyroidism, unspecified: Secondary | ICD-10-CM

## 2022-11-22 DIAGNOSIS — E785 Hyperlipidemia, unspecified: Secondary | ICD-10-CM | POA: Diagnosis not present

## 2022-11-22 DIAGNOSIS — N1831 Chronic kidney disease, stage 3a: Secondary | ICD-10-CM

## 2022-11-22 DIAGNOSIS — Z0000 Encounter for general adult medical examination without abnormal findings: Secondary | ICD-10-CM

## 2022-11-22 DIAGNOSIS — Z0001 Encounter for general adult medical examination with abnormal findings: Secondary | ICD-10-CM

## 2022-11-22 DIAGNOSIS — D508 Other iron deficiency anemias: Secondary | ICD-10-CM

## 2022-11-22 DIAGNOSIS — Z23 Encounter for immunization: Secondary | ICD-10-CM | POA: Insufficient documentation

## 2022-11-22 LAB — LIPID PANEL
Cholesterol: 148 mg/dL (ref 0–200)
HDL: 62.4 mg/dL (ref >39.00)
LDL Cholesterol: 74 mg/dL (ref 0–99)
NonHDL: 85.81
Total CHOL/HDL Ratio: 2
Triglycerides: 58 mg/dL (ref 0.0–149.0)
VLDL: 11.6 mg/dL (ref 0.0–40.0)

## 2022-11-22 LAB — URINALYSIS, ROUTINE W REFLEX MICROSCOPIC
Bilirubin Urine: NEGATIVE
Ketones, ur: NEGATIVE
Leukocytes,Ua: NEGATIVE
Nitrite: NEGATIVE
Specific Gravity, Urine: <=1.005 — AB (ref 1.000–1.030)
Total Protein, Urine: NEGATIVE
Urine Glucose: NEGATIVE
Urobilinogen, UA: 0.2 (ref 0.0–1.0)
pH: 6 (ref 5.0–8.0)

## 2022-11-22 LAB — IBC + FERRITIN
Ferritin: 43.9 ng/mL (ref 10.0–291.0)
Iron: 81 ug/dL (ref 42–145)
Saturation Ratios: 24 % (ref 20.0–50.0)
TIBC: 337.4 ug/dL (ref 250.0–450.0)
Transferrin: 241 mg/dL (ref 212.0–360.0)

## 2022-11-22 LAB — CBC WITH DIFFERENTIAL/PLATELET
Basophils Absolute: 0 10*3/uL (ref 0.0–0.1)
Basophils Relative: 0.4 % (ref 0.0–3.0)
Eosinophils Absolute: 0.1 10*3/uL (ref 0.0–0.7)
Eosinophils Relative: 2.6 % (ref 0.0–5.0)
HCT: 40.5 % (ref 36.0–46.0)
Hemoglobin: 13.2 g/dL (ref 12.0–15.0)
Lymphocytes Relative: 19.2 % (ref 12.0–46.0)
Lymphs Abs: 1 10*3/uL (ref 0.7–4.0)
MCHC: 32.6 g/dL (ref 30.0–36.0)
MCV: 92.1 fl (ref 78.0–100.0)
Monocytes Absolute: 0.5 10*3/uL (ref 0.1–1.0)
Monocytes Relative: 8.6 % (ref 3.0–12.0)
Neutro Abs: 3.7 10*3/uL (ref 1.4–7.7)
Neutrophils Relative %: 69.2 % (ref 43.0–77.0)
Platelets: 266 10*3/uL (ref 150.0–400.0)
RBC: 4.4 Mil/uL (ref 3.87–5.11)
RDW: 13.5 % (ref 11.5–15.5)
WBC: 5.3 10*3/uL (ref 4.0–10.5)

## 2022-11-22 LAB — BASIC METABOLIC PANEL
BUN: 20 mg/dL (ref 6–23)
CO2: 27 mEq/L (ref 19–32)
Calcium: 9.3 mg/dL (ref 8.4–10.5)
Chloride: 104 mEq/L (ref 96–112)
Creatinine, Ser: 0.99 mg/dL (ref 0.40–1.20)
GFR: 53.12 mL/min — ABNORMAL LOW (ref >60.00)
Glucose, Bld: 86 mg/dL (ref 70–99)
Potassium: 4.1 mEq/L (ref 3.5–5.1)
Sodium: 138 mEq/L (ref 135–145)

## 2022-11-22 LAB — TSH: TSH: 3.09 u[IU]/mL (ref 0.35–5.50)

## 2022-11-22 MED ORDER — LEVOTHYROXINE SODIUM 50 MCG PO TABS: 25.0000 ug | ORAL_TABLET | Freq: Every day | ORAL | 1 refills | Status: DC

## 2022-11-22 NOTE — Patient Instructions (Signed)

## 2022-11-22 NOTE — Progress Notes (Unsigned)
Subjective:  Patient ID: [name], female    DOB: 25-Feb-1941  Age: 81 y.o. MRN: [id number]  CC: Hypothyroidism, Anemia, Annual Exam, and Hyperlipidemia   HPI [name] presents for a CPX and f/up -  She is active and denies chest pain, shortness of breath, diaphoresis, or edema.  Outpatient Medications Prior to Visit  Medication Sig Dispense Refill   Calcium Carbonate-Vit D-Min (CALCIUM 1200 PO) Take by mouth.     flecainide (TAMBOCOR) 50 MG tablet TAKE 1/2 TABLET TWICE DAILY 90 tablet 3   magnesium gluconate (MAGONATE) 500 MG tablet Take 500 mg by mouth daily.     Melatonin 10 MG TABS Take 1 tablet by mouth.     metoprolol tartrate (LOPRESSOR) 25 MG tablet Take 0.5 tablets (12.5 mg total) by mouth 2 (two) times daily. 90 tablet 3   Potassium 99 MG TABS Take by mouth.     rivaroxaban (XARELTO) 20 MG TABS tablet Take 1 tablet (20 mg total) by mouth daily with supper. Absolute last refill without office visit please call [phone] 90 tablet 3   rosuvastatin (CRESTOR) 10 MG tablet Take 1 tablet (10 mg total) by mouth daily. Absolute last refill without office visit please call [phone] 90 tablet 3   zolpidem (AMBIEN) 10 MG tablet Take 1 tablet (10 mg total) by mouth at bedtime as needed. for sleep 30 tablet 2   levothyroxine (SYNTHROID) 50 MCG tablet Take 0.5 tablets (25 mcg total) by mouth daily before breakfast. 45 tablet 0   No facility-administered medications prior to visit.    ROS Review of Systems  Constitutional: Negative.  Negative for diaphoresis and fatigue.  HENT: Negative.    Eyes: Negative.   Respiratory: Negative.  Negative for cough, chest tightness, shortness of breath and wheezing.   Cardiovascular:  Negative for chest pain, palpitations and leg swelling.  Gastrointestinal: Negative.  Negative for abdominal pain.  Endocrine: Negative.   Genitourinary: Negative.  Negative for difficulty urinating.  Musculoskeletal: Negative.   Negative for myalgias.  Skin: Negative.   Neurological: Negative.  Negative for dizziness and weakness.  Hematological:  Negative for adenopathy. Does not bruise/bleed easily.  Psychiatric/Behavioral:  Positive for sleep disturbance. Negative for decreased concentration. The patient is not nervous/anxious.     Objective:  BP (!) 142/86 (BP Location: Left Arm, Patient Position: Sitting, Cuff Size: Large)   Pulse 61   Temp 97.7 F (36.5 C) (Oral)   Resp 16   Ht '5\' 6"'$  (1.676 m)   Wt 197 lb (89.4 kg)   SpO2 95%   BMI 31.80 kg/m   BP Readings from Last 3 Encounters:  11/22/22 (!) 142/86  06/20/22 130/80  03/10/22 134/90    Wt Readings from Last 3 Encounters:  11/22/22 197 lb (89.4 kg)  06/20/22 190 lb 3.2 oz (86.3 kg)  03/10/22 190 lb (86.2 kg)    Physical Exam Vitals reviewed.  Constitutional:      Appearance: She is not ill-appearing.  HENT:     Mouth/Throat:     Mouth: Mucous membranes are moist.  Eyes:     General: No scleral icterus.    Pupils: Pupils are equal, round, and reactive to light.  Cardiovascular:     Rate and Rhythm: Normal rate and regular rhythm.     Heart sounds: No murmur heard. Pulmonary:     Effort: Pulmonary effort is normal.     Breath sounds: No stridor. No wheezing, rhonchi or rales.  Abdominal:  General: Abdomen is flat.     Palpations: There is no mass.     Tenderness: There is no abdominal tenderness. There is no guarding.     Hernia: No hernia is present.  Musculoskeletal:        General: Normal range of motion.     Cervical back: Neck supple.     Right lower leg: No edema.     Left lower leg: No edema.  Lymphadenopathy:     Cervical: No cervical adenopathy.  Skin:    General: Skin is warm and dry.  Neurological:     General: No focal deficit present.     Mental Status: She is alert.  Psychiatric:        Mood and Affect: Mood normal.        Behavior: Behavior normal.     Lab Results  Component Value Date   WBC 5.3  11/22/2022   HGB 13.2 11/22/2022   HCT 40.5 11/22/2022   PLT 266.0 11/22/2022   GLUCOSE 86 11/22/2022   CHOL 148 11/22/2022   TRIG 58.0 11/22/2022   HDL 62.40 11/22/2022   LDLDIRECT 127.1 07/08/2013   LDLCALC 74 11/22/2022   ALT 16 01/18/2022   AST 22 01/18/2022   NA 138 11/22/2022   K 4.1 11/22/2022   CL 104 11/22/2022   CREATININE 0.99 11/22/2022   BUN 20 11/22/2022   CO2 27 11/22/2022   TSH 3.09 11/22/2022   INR 1.4 (H) 07/03/2019   HGBA1C 5.5 03/13/2008   MICROALBUR 0.8 12/03/2006    MM 3D SCREEN BREAST BILATERAL  Result Date: 07/07/2022 CLINICAL DATA:  Screening. EXAM: DIGITAL SCREENING BILATERAL MAMMOGRAM WITH TOMOSYNTHESIS AND CAD TECHNIQUE: Bilateral screening digital craniocaudal and mediolateral oblique mammograms were obtained. Bilateral screening digital breast tomosynthesis was performed. The images were evaluated with computer-aided detection. COMPARISON:  Previous exam(s). ACR Breast Density Category b: There are scattered areas of fibroglandular density. FINDINGS: There are no findings suspicious for malignancy. IMPRESSION: No mammographic evidence of malignancy. A result letter of this screening mammogram will be mailed directly to the patient. RECOMMENDATION: Screening mammogram in one year. (Code:[id number]-01Y) BI-RADS CATEGORY  1: Negative. Electronically Signed   By: Nolon Nations M.D.   On: 07/07/2022 10:55    Assessment & Plan:   [name] was seen today for hypothyroidism, anemia, annual exam and hyperlipidemia.  Diagnoses and all orders for this visit:  Acquired hypothyroidism- She is euthyroid. -     TSH; Future -     TSH -     levothyroxine (SYNTHROID) 50 MCG tablet; Take 0.5 tablets (25 mcg total) by mouth daily before breakfast.  Iron deficiency anemia secondary to inadequate dietary iron intake- Her H&H and iron are normal. -     IBC + Ferritin; Future -     CBC with Differential/Platelet; Future -     IBC + Ferritin -     CBC with  Differential/Platelet  Dyslipidemia, goal LDL below 100- LDL goal achieved. Doing well on the statin  -     Lipid panel; Future -     Lipid panel  Stage 3a chronic kidney disease ([place])- Her renal function is stable.  She is avoiding nephrotoxic agents. -     Basic metabolic panel; Future -     Urinalysis, Routine w reflex microscopic; Future -     Basic metabolic panel -     Urinalysis, Routine w reflex microscopic  Flu vaccine need -     Flu Vaccine QUAD High  Dose(Fluad)  Encounter for general adult medical examination with abnormal findings- Exam completed, labs reviewed, vaccines reviewed and updated, no cancer screenings indicated, patient education was given.   I am having Royetta Crochet. Reppond maintain her Calcium Carbonate-Vit D-Min (CALCIUM 1200 PO), magnesium gluconate, Potassium, Melatonin, rosuvastatin, metoprolol tartrate, flecainide, rivaroxaban, zolpidem, and levothyroxine.  Meds ordered this encounter  Medications   levothyroxine (SYNTHROID) 50 MCG tablet    Sig: Take 0.5 tablets (25 mcg total) by mouth daily before breakfast.    Dispense:  45 tablet    Refill:  1     Follow-up: Return in about 6 months (around 05/23/2023).  Scarlette Calico, MD

## 2022-12-19 ENCOUNTER — Ambulatory Visit (HOSPITAL_COMMUNITY)
Admission: RE | Admit: 2022-12-19 | Discharge: 2022-12-19 | Disposition: A | Discharge status: Home / Self Care | Payer: Medicare Other | Source: Ambulatory Visit | Attending: Physician Assistant | Admitting: Physician Assistant

## 2022-12-19 ENCOUNTER — Encounter (HOSPITAL_COMMUNITY): Payer: Self-pay | Admitting: Physician Assistant

## 2022-12-19 VITALS — BP 136/84 | HR 67 | Ht 66.0 in | Wt 199.8 lb

## 2022-12-19 DIAGNOSIS — I4819 Other persistent atrial fibrillation: Secondary | ICD-10-CM | POA: Diagnosis not present

## 2022-12-19 DIAGNOSIS — Z86711 Personal history of pulmonary embolism: Secondary | ICD-10-CM | POA: Diagnosis not present

## 2022-12-19 DIAGNOSIS — I4892 Unspecified atrial flutter: Secondary | ICD-10-CM | POA: Diagnosis not present

## 2022-12-19 DIAGNOSIS — D6869 Other thrombophilia: Secondary | ICD-10-CM | POA: Diagnosis not present

## 2022-12-19 DIAGNOSIS — Z7901 Long term (current) use of anticoagulants: Secondary | ICD-10-CM | POA: Diagnosis not present

## 2022-12-19 DIAGNOSIS — Z79899 Other long term (current) drug therapy: Secondary | ICD-10-CM | POA: Insufficient documentation
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
## 2022-12-20 ENCOUNTER — Telehealth: Payer: Self-pay | Admitting: Internal Medicine

## 2022-12-20 NOTE — Telephone Encounter (Signed)
[name], a nurse practitioner with [facility] states she did a PAD on the pt and found R lower extremity PVD.   [name] will be faxing a full report in 10-14 days.  For any questions please call: [phone]

## 2022-12-20 NOTE — Telephone Encounter (Signed)
FYI, please advise if needed 

## 2022-12-21 ENCOUNTER — Other Ambulatory Visit: Payer: Self-pay | Admitting: Internal Medicine

## 2022-12-21 DIAGNOSIS — I739 Peripheral vascular disease, unspecified: Secondary | ICD-10-CM

## 2022-12-26 ENCOUNTER — Ambulatory Visit (HOSPITAL_COMMUNITY)
Admission: RE | Admit: 2022-12-26 | Discharge: 2022-12-26 | Disposition: A | Discharge status: Home / Self Care | Payer: Medicare Other | Source: Ambulatory Visit | Attending: Internal Medicine | Admitting: Internal Medicine

## 2022-12-26 DIAGNOSIS — I739 Peripheral vascular disease, unspecified: Secondary | ICD-10-CM

## 2023-01-25 ENCOUNTER — Encounter: Payer: Self-pay | Admitting: Family Medicine

## 2023-01-25 ENCOUNTER — Ambulatory Visit (INDEPENDENT_AMBULATORY_CARE_PROVIDER_SITE_OTHER): Payer: Medicare Other | Admitting: Family Medicine

## 2023-01-25 VITALS — BP 136/82 | HR 58 | Temp 97.6°F | Ht 66.0 in | Wt 199.0 lb

## 2023-01-25 DIAGNOSIS — R1084 Generalized abdominal pain: Secondary | ICD-10-CM | POA: Diagnosis not present

## 2023-01-25 NOTE — Patient Instructions (Addendum)
I am glad your pain resolved.  If it returns or you develop any new symptoms such as fever, chills, nausea, vomiting, diarrhea or constipation, let us know.    Abdominal Pain, Adult Many things can cause belly (abdominal) pain. Most times, belly pain is not dangerous. Many cases of belly pain can be watched and treated at home. Sometimes, though, belly pain is serious. Your doctor will try to find the cause of your belly pain. Follow these instructions at home:  Medicines Take over-the-counter and prescription medicines only as told by your doctor. Do not take medicines that help you poop (laxatives) unless told by your doctor. General instructions Watch your belly pain for any changes. Drink enough fluid to keep your pee (urine) pale yellow. Keep all follow-up visits as told by your doctor. This is important. Contact a doctor if: Your belly pain changes or gets worse. You are not hungry, or you lose weight without trying. You are having trouble pooping (constipated) or have watery poop (diarrhea) for more than 2-3 days. You have pain when you pee or poop. Your belly pain wakes you up at night. Your pain gets worse with meals, after eating, or with certain foods. You are vomiting and cannot keep anything down. You have a fever. You have blood in your pee. Get help right away if: Your pain does not go away as soon as your doctor says it should. You cannot stop vomiting. Your pain is only in areas of your belly, such as the right side or the left lower part of the belly. You have bloody or black poop, or poop that looks like tar. You have very bad pain, cramping, or bloating in your belly. You have signs of not having enough fluid or water in your body (dehydration), such as: Dark pee, very little pee, or no pee. Cracked lips. Dry mouth. Sunken eyes. Sleepiness. Weakness. You have trouble breathing or chest pain. Summary Many cases of belly pain can be watched and treated at  home. Watch your belly pain for any changes. Take over-the-counter and prescription medicines only as told by your doctor. Contact a doctor if your belly pain changes or gets worse. Get help right away if you have very bad pain, cramping, or bloating in your belly. This information is not intended to replace advice given to you by your health care provider. Make sure you discuss any questions you have with your health care provider. Document Revised: 04/21/2019 Document Reviewed: 04/21/2019 Elsevier Patient Education  [place].
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
## 2023-03-07 NOTE — Progress Notes (Signed)
Patient ID: [name], female   DOB: 1941-02-08, 82 y.o.   MRN: [id number]    Advanced Heart Failure Clinic Note    PCP: Janith Lima, MD HF MD: Dr Haroldine Laws  EP:  Dr Rayann Heman  HPI: [name] is a 82 y/o woman with h/o of obesity, PAF and AFL s/p AFL ablation.   She was admitted in 11/13 with acute bilateral PE, rapid AF and heart failure after a long plane trip. .EF 40-45%.  On 10/28/12 Unsuccessful DCCV . Loaded amio and underwent repeat DC-CV on 11/13. Maintained on Xarelto. In 11/13 had AFL.  Amio increased to 400 bid. On 12/10/12 successful DC-CV.  Amiodarone was eventually stopped.   04/22/13 ECHO EF 65%, mild LVH, normal RV size and systolic function, PA systolic pressure 35 mmHg .Had recurrent AF in 9/15. Amiodarone was restarted and she was cardioverted to NSR on 09/14/14. Seen again on Oct 16. Asymptomatic. Was back in AFL with RVR at 150. Underwent DC-CV but reverted to AFL. S/p A flutter ablation 12/15/2014. Was seen by Dr Rayann Heman in follow up last 01/08/15 and was stable at that time with no symptoms. She was in NSR at that time. Metoprolol stopped.   Admitted 10/31/16-11/02/16 with SOB, AFib. Started on flecainide.   Echo 12/19 EF 55-60%   In 9/20 we saw her for unexplained dyspnea. REDS 31% Zio patch ok (no AF). Cardiac CT mild plaque prox LAD otherwise normal.   Today she returns for yearly HF follow up.Overall feeling fine. Denies SOB/PND/Orthopnea. Walking 10,000 steps a day and takes water aerobics a few times a week. Appetite ok. No fever or chills. Wants to lose weight. Taking all medications.   ROS: All systems negative except as listed in HPI, PMH and Problem List.  Past Medical History:  Diagnosis Date   1st degree AV block    Atrial flutter ([place])    a. s/p CTI ablation 11/2014   Cataract    Diverticulosis 2002   Dr. Fuller Plan   DVT (deep venous thrombosis) ([place]) 2013   "S/P a long airplane ride"   Endometrial polyp    Fasting hyperglycemia    A1C   within normal limits   Hyperlipidemia    Hypothyroidism    LBBB (left bundle branch block)    LVH (left ventricular hypertrophy) 2014   Mild, Noted on ECHO   Obese    Pancreatitis due to common bile duct stone    Persistent atrial fibrillation (HCC)    RVR   Personal history of colonic polyps 2002   tubular adenoma   PMB (postmenopausal bleeding)    Pulmonary embolism ([place]) 2013   "S/P a long airplane ride"   Sinus bradycardia    Tortuous colon    Vitamin D deficiency    Wears glasses     Current Outpatient Medications  Medication Sig Dispense Refill   Calcium Carbonate-Vit D-Min (CALCIUM 1200 PO) Take by mouth.     flecainide (TAMBOCOR) 50 MG tablet TAKE 1/2 TABLET TWICE DAILY 90 tablet 3   levothyroxine (SYNTHROID) 50 MCG tablet Take 0.5 tablets (25 mcg total) by mouth daily before breakfast. 45 tablet 1   magnesium gluconate (MAGONATE) 500 MG tablet Take 500 mg by mouth daily.     Melatonin 10 MG TABS Take 1 tablet by mouth.     metoprolol tartrate (LOPRESSOR) 25 MG tablet Take 0.5 tablets (12.5 mg total) by mouth 2 (two) times daily. 90 tablet 3   Potassium 99 MG TABS Take  by mouth daily.     rivaroxaban (XARELTO) 20 MG TABS tablet Take 1 tablet (20 mg total) by mouth daily with supper. Absolute last refill without office visit please call [phone] 90 tablet 3   rosuvastatin (CRESTOR) 10 MG tablet Take 1 tablet (10 mg total) by mouth daily. Absolute last refill without office visit please call [phone] 90 tablet 3   zolpidem (AMBIEN) 10 MG tablet Take 1 tablet (10 mg total) by mouth at bedtime as needed. for sleep 30 tablet 2   No current facility-administered medications for this encounter.   Vitals:   03/08/23 1008  BP: (!) 150/86  Pulse: (!) 53  SpO2: 96%  Weight: 91.1 kg (200 lb 12.8 oz)     Wt Readings from Last 3 Encounters:  03/08/23 91.1 kg (200 lb 12.8 oz)  01/25/23 90.3 kg (199 lb)  12/19/22 90.6 kg (199 lb 12.8 oz)    PHYSICAL EXAM: General:   Well appearing. No resp difficulty HEENT: normal Neck: supple. no JVD. Carotids 2+ bilat; no bruits. No lymphadenopathy or thryomegaly appreciated. Cor: PMI nondisplaced. Regular rate & rhythm. No rubs, gallops or murmurs. Lungs: clear Abdomen: soft, nontender, nondistended. No hepatosplenomegaly. No bruits or masses. Good bowel sounds. Extremities: no cyanosis, clubbing, rash, edema Neuro: alert & orientedx3, cranial nerves grossly intact. moves all 4 extremities w/o difficulty. Affect pleasant  EKG: SB with occasional PVCs 58 bpm.    ASSESSMENT & PLAN:  1.  Paroxysmal Afib RVR:  - Has had history of recurrent AFL despite DC-CV (x 2 in 2013 and x 1 in 2015), now, as above s/p AFL ablation by Dr Rayann Heman. Long history of AF/AFL by EKGs.  Afib s/p DCCV in 09/22/16. - Recurrent AF in 11/17 now NSR on flecainide/metoprolol.  - EKG - SB today. No bleeding issues.  - Continue Xarelto for anticogulation.  2) Hx of DVT and PEs - Continue Xarelto '20mg'$  daily No bleeding 3) History of chronic systolic CHF/Grade IDD H/O of acute PE.  - EF recovered 12/19 EF 55-60%  . Repeat ECHO next visit.  - NYHA I. Volume status stable.  -Continue current regimen.  4) LBBB - stable 5) Dyslipidemia  Continue crestor  Refill cardiac meds today. Follow up in 1 year with Dr Haroldine Laws. Dejha King NP-C  10:15 AM 03/08/2023

## 2023-03-08 ENCOUNTER — Ambulatory Visit (HOSPITAL_COMMUNITY)
Admission: RE | Admit: 2023-03-08 | Discharge: 2023-03-08 | Disposition: A | Discharge status: Home / Self Care | Payer: Medicare Other | Source: Ambulatory Visit | Attending: Adult Health | Admitting: Adult Health

## 2023-03-08 ENCOUNTER — Encounter (HOSPITAL_COMMUNITY): Payer: Self-pay

## 2023-03-08 VITALS — BP 150/86 | HR 53 | Wt 200.8 lb

## 2023-03-08 DIAGNOSIS — R001 Bradycardia, unspecified: Secondary | ICD-10-CM | POA: Diagnosis not present

## 2023-03-08 DIAGNOSIS — Z86718 Personal history of other venous thrombosis and embolism: Secondary | ICD-10-CM | POA: Diagnosis not present

## 2023-03-08 DIAGNOSIS — E785 Hyperlipidemia, unspecified: Secondary | ICD-10-CM | POA: Diagnosis not present

## 2023-03-08 DIAGNOSIS — Z86711 Personal history of pulmonary embolism: Secondary | ICD-10-CM | POA: Insufficient documentation

## 2023-03-08 DIAGNOSIS — Z7901 Long term (current) use of anticoagulants: Secondary | ICD-10-CM | POA: Diagnosis not present

## 2023-03-08 DIAGNOSIS — I447 Left bundle-branch block, unspecified: Secondary | ICD-10-CM | POA: Diagnosis not present

## 2023-03-08 DIAGNOSIS — I5022 Chronic systolic (congestive) heart failure: Secondary | ICD-10-CM | POA: Insufficient documentation

## 2023-03-08 DIAGNOSIS — I4819 Other persistent atrial fibrillation: Secondary | ICD-10-CM

## 2023-03-08 DIAGNOSIS — Z79899 Other long term (current) drug therapy: Secondary | ICD-10-CM | POA: Diagnosis not present

## 2023-03-08 DIAGNOSIS — E669 Obesity, unspecified: Secondary | ICD-10-CM | POA: Diagnosis not present

## 2023-03-08 DIAGNOSIS — I5032 Chronic diastolic (congestive) heart failure: Secondary | ICD-10-CM | POA: Diagnosis not present

## 2023-03-08 DIAGNOSIS — I480 Paroxysmal atrial fibrillation: Secondary | ICD-10-CM

## 2023-03-08 MED ORDER — METOPROLOL TARTRATE 25 MG PO TABS: 12.5000 mg | ORAL_TABLET | Freq: Two times a day (BID) | ORAL | 3 refills | Status: DC

## 2023-03-08 MED ORDER — ROSUVASTATIN CALCIUM 10 MG PO TABS: 10.0000 mg | ORAL_TABLET | Freq: Every day | ORAL | 3 refills | Status: DC

## 2023-03-08 MED ORDER — RIVAROXABAN 20 MG PO TABS: 20.0000 mg | ORAL_TABLET | Freq: Every day | ORAL | 3 refills | Status: DC

## 2023-03-08 MED ORDER — FLECAINIDE ACETATE 50 MG PO TABS: 25.0000 mg | ORAL_TABLET | Freq: Two times a day (BID) | ORAL | 3 refills | Status: DC

## 2023-03-08 NOTE — Patient Instructions (Addendum)
It was great to see you today! No medication changes are needed at this time.  Your physician wants you to follow-up in: 12 months with Dr Haroldine Laws You will receive a reminder letter in the mail two months in advance. If you don't receive a letter, please call our office to schedule the follow-up appointment.   Do the following things EVERYDAY: Weigh yourself in the morning before breakfast. Write it down and keep it in a log. Take your medicines as prescribed Eat low salt foods--Limit salt (sodium) to 2000 mg per day.  Stay as active as you can everyday Limit all fluids for the day to less than 2 liters  At the [facility], you and your health needs are our priority. As part of our continuing mission to provide you with exceptional heart care, we have created designated Provider Care Teams. These Care Teams include your primary Cardiologist (physician) and Advanced Practice Providers (APPs- Physician Assistants and Nurse Practitioners) who all work together to provide you with the care you need, when you need it.   You may see any of the following providers on your designated Care Team at your next follow up: Dr Glori Bickers Dr Loralie Champagne Dr. Roxana Hires, NP Lyda Jester, [place] [facility] [place], [place] Forestine Na, NP Audry Riles, PharmD   Please be sure to bring in all your medications bottles to every appointment.    Thank you for choosing [facility]

## 2023-03-15 ENCOUNTER — Other Ambulatory Visit (HOSPITAL_COMMUNITY): Payer: Self-pay | Admitting: Internal Medicine

## 2023-03-15 DIAGNOSIS — I4819 Other persistent atrial fibrillation: Secondary | ICD-10-CM

## 2023-04-07 ENCOUNTER — Other Ambulatory Visit: Payer: Self-pay | Admitting: Internal Medicine

## 2023-04-07 DIAGNOSIS — F5104 Psychophysiologic insomnia: Secondary | ICD-10-CM

## 2023-04-19 DIAGNOSIS — H2511 Age-related nuclear cataract, right eye: Secondary | ICD-10-CM | POA: Diagnosis not present

## 2023-05-06 ENCOUNTER — Other Ambulatory Visit (HOSPITAL_COMMUNITY): Payer: Self-pay | Admitting: Internal Medicine

## 2023-05-06 DIAGNOSIS — I4819 Other persistent atrial fibrillation: Secondary | ICD-10-CM

## 2023-06-05 ENCOUNTER — Other Ambulatory Visit: Payer: Self-pay | Admitting: Internal Medicine

## 2023-06-05 ENCOUNTER — Other Ambulatory Visit (HOSPITAL_COMMUNITY): Payer: Self-pay | Admitting: Internal Medicine

## 2023-06-05 DIAGNOSIS — E039 Hypothyroidism, unspecified: Secondary | ICD-10-CM

## 2023-06-05 DIAGNOSIS — I4819 Other persistent atrial fibrillation: Secondary | ICD-10-CM

## 2023-06-05 DIAGNOSIS — Z1231 Encounter for screening mammogram for malignant neoplasm of breast: Secondary | ICD-10-CM

## 2023-06-11 ENCOUNTER — Other Ambulatory Visit: Payer: Self-pay | Admitting: Internal Medicine

## 2023-06-11 DIAGNOSIS — E039 Hypothyroidism, unspecified: Secondary | ICD-10-CM

## 2023-07-12 ENCOUNTER — Ambulatory Visit
Admission: RE | Admit: 2023-07-12 | Discharge: 2023-07-12 | Disposition: A | Discharge status: Home / Self Care | Payer: Medicare Other | Source: Ambulatory Visit | Attending: Internal Medicine | Admitting: Internal Medicine

## 2023-07-12 DIAGNOSIS — Z1231 Encounter for screening mammogram for malignant neoplasm of breast: Secondary | ICD-10-CM | POA: Diagnosis not present

## 2023-09-03 ENCOUNTER — Other Ambulatory Visit: Payer: Self-pay | Admitting: Internal Medicine

## 2023-09-03 DIAGNOSIS — E039 Hypothyroidism, unspecified: Secondary | ICD-10-CM

## 2023-09-06 ENCOUNTER — Ambulatory Visit (INDEPENDENT_AMBULATORY_CARE_PROVIDER_SITE_OTHER): Payer: Medicare Other

## 2023-09-06 VITALS — BP 116/70 | HR 60 | Ht 65.0 in | Wt 200.0 lb

## 2023-09-06 DIAGNOSIS — F5104 Psychophysiologic insomnia: Secondary | ICD-10-CM

## 2023-09-06 DIAGNOSIS — Z780 Asymptomatic menopausal state: Secondary | ICD-10-CM

## 2023-09-06 DIAGNOSIS — E039 Hypothyroidism, unspecified: Secondary | ICD-10-CM | POA: Diagnosis not present

## 2023-09-06 DIAGNOSIS — Z0000 Encounter for general adult medical examination without abnormal findings: Secondary | ICD-10-CM

## 2023-09-06 MED ORDER — ZOLPIDEM TARTRATE 10 MG PO TABS: 10.0000 mg | ORAL_TABLET | Freq: Every evening | ORAL | 2 refills | Status: DC | PRN

## 2023-09-06 MED ORDER — LEVOTHYROXINE SODIUM 50 MCG PO TABS: 50.0000 ug | ORAL_TABLET | Freq: Every day | ORAL | 0 refills | Status: DC
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
## 2023-09-06 NOTE — Patient Instructions (Addendum)
Ms. Highbaugh , Thank you for taking time to come for your Medicare Wellness Visit. I appreciate your ongoing commitment to your health goals. Please review the following plan we discussed and let me know if I can assist you in the future.   Referrals/Orders/Follow-Ups/Clinician Recommendations: You are due for a Bone Density screening and I have placed the order.  Please call [facility] at [phone] to schedule an appointment.  Each day, aim for 6 glasses of water, plenty of protein in your diet and try to get up and walk/ stretch every hour for 5-10 minutes at a time.  Keep up the good work and it was a pleasure speaking with you today.  This is a list of the screening recommended for you and due dates:  Health Maintenance  Topic Date Due   Flu Shot  07/26/2023   COVID-19 Vaccine (3 - 2023-24 season) 08/26/2023   Medicare Annual Wellness Visit  09/05/2024   DTaP/Tdap/Td vaccine (3 - Td or Tdap) 07/02/2029   Pneumonia Vaccine  Completed   Zoster (Shingles) Vaccine  Completed   HPV Vaccine  Aged Out    Advanced directives: (Copy Requested) Please bring a copy of your health care power of attorney and living will to the office to be added to your chart at your convenience.  Next Medicare Annual Wellness Visit scheduled for next year: Yes

## 2023-09-26 ENCOUNTER — Other Ambulatory Visit: Payer: Self-pay | Admitting: Internal Medicine

## 2023-09-26 DIAGNOSIS — F5104 Psychophysiologic insomnia: Secondary | ICD-10-CM

## 2023-10-18 ENCOUNTER — Other Ambulatory Visit: Payer: Self-pay | Admitting: Internal Medicine

## 2023-10-18 DIAGNOSIS — E039 Hypothyroidism, unspecified: Secondary | ICD-10-CM

## 2023-11-22 ENCOUNTER — Other Ambulatory Visit: Payer: Self-pay | Admitting: Internal Medicine

## 2023-11-22 DIAGNOSIS — E039 Hypothyroidism, unspecified: Secondary | ICD-10-CM

## 2023-11-28 ENCOUNTER — Other Ambulatory Visit: Payer: Self-pay | Admitting: Internal Medicine

## 2023-11-28 DIAGNOSIS — E039 Hypothyroidism, unspecified: Secondary | ICD-10-CM

## 2023-11-29 ENCOUNTER — Ambulatory Visit: Payer: Medicare Other | Admitting: Internal Medicine

## 2023-11-29 ENCOUNTER — Encounter: Payer: Self-pay | Admitting: Internal Medicine

## 2023-11-29 VITALS — BP 142/68 | HR 62 | Temp 97.8°F | Resp 16 | Ht 65.0 in | Wt 197.4 lb

## 2023-11-29 DIAGNOSIS — D508 Other iron deficiency anemias: Secondary | ICD-10-CM | POA: Diagnosis not present

## 2023-11-29 DIAGNOSIS — Z0001 Encounter for general adult medical examination with abnormal findings: Secondary | ICD-10-CM

## 2023-11-29 DIAGNOSIS — N1831 Chronic kidney disease, stage 3a: Secondary | ICD-10-CM | POA: Diagnosis not present

## 2023-11-29 DIAGNOSIS — E785 Hyperlipidemia, unspecified: Secondary | ICD-10-CM | POA: Diagnosis not present

## 2023-11-29 DIAGNOSIS — F5104 Psychophysiologic insomnia: Secondary | ICD-10-CM | POA: Diagnosis not present

## 2023-11-29 DIAGNOSIS — E559 Vitamin D deficiency, unspecified: Secondary | ICD-10-CM

## 2023-11-29 DIAGNOSIS — R3121 Asymptomatic microscopic hematuria: Secondary | ICD-10-CM | POA: Diagnosis not present

## 2023-11-29 DIAGNOSIS — E039 Hypothyroidism, unspecified: Secondary | ICD-10-CM | POA: Diagnosis not present

## 2023-11-29 LAB — BASIC METABOLIC PANEL
BUN: 21 mg/dL (ref 6–23)
CO2: 27 meq/L (ref 19–32)
Calcium: 9.5 mg/dL (ref 8.4–10.5)
Chloride: 101 meq/L (ref 96–112)
Creatinine, Ser: 0.87 mg/dL (ref 0.40–1.20)
GFR: 61.59 mL/min (ref >60.00)
Glucose, Bld: 92 mg/dL (ref 70–99)
Potassium: 4.5 meq/L (ref 3.5–5.1)
Sodium: 136 meq/L (ref 135–145)

## 2023-11-29 LAB — HEPATIC FUNCTION PANEL
ALT: 12 U/L (ref 0–35)
AST: 19 U/L (ref 0–37)
Albumin: 4.2 g/dL (ref 3.5–5.2)
Alkaline Phosphatase: 66 U/L (ref 39–117)
Bilirubin, Direct: 0.1 mg/dL (ref 0.0–0.3)
Total Bilirubin: 0.8 mg/dL (ref 0.2–1.2)
Total Protein: 7.2 g/dL (ref 6.0–8.3)

## 2023-11-29 LAB — LIPID PANEL
Cholesterol: 159 mg/dL (ref 0–200)
HDL: 55 mg/dL (ref >39.00)
LDL Cholesterol: 88 mg/dL (ref 0–99)
NonHDL: 104.4
Total CHOL/HDL Ratio: 3
Triglycerides: 84 mg/dL (ref 0.0–149.0)
VLDL: 16.8 mg/dL (ref 0.0–40.0)

## 2023-11-29 LAB — CBC WITH DIFFERENTIAL/PLATELET
Basophils Absolute: 0 10*3/uL (ref 0.0–0.1)
Basophils Relative: 0.4 % (ref 0.0–3.0)
Eosinophils Absolute: 0.2 10*3/uL (ref 0.0–0.7)
Eosinophils Relative: 1.8 % (ref 0.0–5.0)
HCT: 40.5 % (ref 36.0–46.0)
Hemoglobin: 13.8 g/dL (ref 12.0–15.0)
Lymphocytes Relative: 12.2 % (ref 12.0–46.0)
Lymphs Abs: 1 10*3/uL (ref 0.7–4.0)
MCHC: 34.2 g/dL (ref 30.0–36.0)
MCV: 92 fL (ref 78.0–100.0)
Monocytes Absolute: 0.5 10*3/uL (ref 0.1–1.0)
Monocytes Relative: 5.7 % (ref 3.0–12.0)
Neutro Abs: 6.9 10*3/uL (ref 1.4–7.7)
Neutrophils Relative %: 79.9 % — ABNORMAL HIGH (ref 43.0–77.0)
Platelets: 282 10*3/uL (ref 150.0–400.0)
RBC: 4.4 Mil/uL (ref 3.87–5.11)
RDW: 13.6 % (ref 11.5–15.5)
WBC: 8.6 10*3/uL (ref 4.0–10.5)

## 2023-11-29 LAB — URINALYSIS, ROUTINE W REFLEX MICROSCOPIC
Bilirubin Urine: NEGATIVE
Ketones, ur: 15 — AB
Leukocytes,Ua: NEGATIVE
Nitrite: NEGATIVE
Specific Gravity, Urine: 1.02 (ref 1.000–1.030)
Total Protein, Urine: NEGATIVE
Urine Glucose: NEGATIVE
Urobilinogen, UA: 0.2 (ref 0.0–1.0)
pH: 6 (ref 5.0–8.0)

## 2023-11-29 LAB — TSH: TSH: 3.1 u[IU]/mL (ref 0.35–5.50)

## 2023-11-29 LAB — IBC + FERRITIN
Ferritin: 45.7 ng/mL (ref 10.0–291.0)
Iron: 96 ug/dL (ref 42–145)
Saturation Ratios: 29.1 % (ref 20.0–50.0)
TIBC: 330.4 ug/dL (ref 250.0–450.0)
Transferrin: 236 mg/dL (ref 212.0–360.0)

## 2023-11-29 LAB — VITAMIN D 25 HYDROXY (VIT D DEFICIENCY, FRACTURES): VITD: 33.55 ng/mL (ref 30.00–100.00)

## 2023-11-29 MED ORDER — LEVOTHYROXINE SODIUM 50 MCG PO TABS: 50.0000 ug | ORAL_TABLET | Freq: Every day | ORAL | 0 refills | Status: DC

## 2023-11-29 NOTE — Patient Instructions (Signed)

## 2023-11-29 NOTE — Progress Notes (Signed)
Subjective:  Patient ID: [name], female    DOB: 06/03/1941  Age: 82 y.o. MRN: [id number]  CC: Annual Exam, Atrial Fibrillation, Anemia, Hypertension, Hypothyroidism, and Hyperlipidemia   HPI [name] presents for a CPX and f/up ---  Discussed the use of AI scribe software for clinical note transcription with the patient, who gave verbal consent to proceed.  History of Present Illness   The patient, with a history of atrial fibrillation, reports no current complaints or symptoms related to her cardiac condition. She has been seeing a cardiologist annually and has not experienced any issues.  However, the patient did experience an episode of abdominal pressure last year, which was intermittent and lasted for a day. The pressure was located on the right side of the abdomen and was not associated with any pain. The patient sought medical attention due to concerns about a potential appendicitis, but the symptoms resolved the following day and have not recurred since.  The patient also reports receiving her flu shot and COVID-19 booster shot recently. She denies any current symptoms of dizziness, lightheadedness, or swelling in the legs or feet.  The patient is due for a heart check-up in two weeks, during which an EKG will be performed. She also reports that her last urine test showed some red blood cells, and she has been asked to provide another urine sample for further investigation.        Outpatient Medications Prior to Visit  Medication Sig Dispense Refill   Calcium Carbonate-Vit D-Min (CALCIUM 1200 PO) Take by mouth.     flecainide (TAMBOCOR) 50 MG tablet Take 0.5 tablets (25 mg total) by mouth 2 (two) times daily. 90 tablet 3   magnesium gluconate (MAGONATE) 500 MG tablet Take 500 mg by mouth daily.     Melatonin 10 MG TABS Take 1 tablet by mouth.     metoprolol tartrate (LOPRESSOR) 25 MG tablet Take 0.5 tablets (12.5 mg total) by mouth 2 (two) times  daily. 90 tablet 3   Potassium 99 MG TABS Take by mouth daily.     rivaroxaban (XARELTO) 20 MG TABS tablet TAKE 1 TABLET BY MOUTH DAILY  WITH SUPPER 100 tablet 4   rosuvastatin (CRESTOR) 10 MG tablet Take 1 tablet (10 mg total) by mouth daily. 90 tablet 3   levothyroxine (SYNTHROID) 50 MCG tablet Take 1 tablet (50 mcg total) by mouth daily before breakfast. 45 tablet 0   zolpidem (AMBIEN) 10 MG tablet Take 1 tablet (10 mg total) by mouth at bedtime as needed. for sleep 30 tablet 2   No facility-administered medications prior to visit.    ROS Review of Systems  Constitutional: Negative.  Negative for chills, diaphoresis, fatigue and fever.  HENT: Negative.    Eyes: Negative.   Respiratory: Negative.  Negative for cough, chest tightness, shortness of breath and wheezing.   Cardiovascular:  Negative for chest pain, palpitations and leg swelling.  Gastrointestinal:  Negative for abdominal pain, constipation, diarrhea, nausea and vomiting.  Endocrine: Negative.   Genitourinary: Negative.  Negative for difficulty urinating, dysuria, frequency, hematuria and pelvic pain.  Musculoskeletal: Negative.  Negative for arthralgias and myalgias.  Skin: Negative.  Negative for color change.  Neurological: Negative.  Negative for dizziness, tremors, weakness and light-headedness.  Hematological:  Negative for adenopathy. Does not bruise/bleed easily.  Psychiatric/Behavioral:  Positive for sleep disturbance. Negative for confusion, decreased concentration and dysphoric mood. The patient is not nervous/anxious.     Objective:  BP (!) 142/68 (  BP Location: Left Arm, Patient Position: Sitting, Cuff Size: Normal)   Pulse 62   Temp 97.8 F (36.6 C) (Oral)   Resp 16   Ht 5\' 5"  (1.651 m)   Wt 197 lb 6.4 oz (89.5 kg)   SpO2 97%   BMI 32.85 kg/m   BP Readings from Last 3 Encounters:  11/29/23 (!) 142/68  09/06/23 116/70  03/08/23 (!) 150/86    Wt Readings from Last 3 Encounters:  11/29/23 197 lb  6.4 oz (89.5 kg)  09/06/23 200 lb (90.7 kg)  03/08/23 200 lb 12.8 oz (91.1 kg)    Physical Exam Vitals reviewed.  Constitutional:      Appearance: Normal appearance.  HENT:     Mouth/Throat:     Mouth: Mucous membranes are moist.  Eyes:     General: No scleral icterus.    Conjunctiva/sclera: Conjunctivae normal.  Cardiovascular:     Rate and Rhythm: Normal rate and regular rhythm. Occasional Extrasystoles are present.    Heart sounds: No murmur heard.    No gallop.  Pulmonary:     Effort: Pulmonary effort is normal.     Breath sounds: No stridor. No wheezing, rhonchi or rales.  Abdominal:     General: Abdomen is flat.     Palpations: There is no mass.     Tenderness: There is no abdominal tenderness. There is no guarding.     Hernia: No hernia is present.  Musculoskeletal:        General: Normal range of motion.     Cervical back: Neck supple.     Right lower leg: No edema.     Left lower leg: No edema.  Skin:    General: Skin is warm and dry.  Neurological:     General: No focal deficit present.     Mental Status: She is alert. Mental status is at baseline.  Psychiatric:        Mood and Affect: Mood normal.        Behavior: Behavior normal.        Thought Content: Thought content normal.     Lab Results  Component Value Date   WBC 8.6 11/29/2023   HGB 13.8 11/29/2023   HCT 40.5 11/29/2023   PLT 282.0 11/29/2023   GLUCOSE 92 11/29/2023   CHOL 159 11/29/2023   TRIG 84.0 11/29/2023   HDL 55.00 11/29/2023   LDLDIRECT 127.1 07/08/2013   LDLCALC 88 11/29/2023   ALT 12 11/29/2023   AST 19 11/29/2023   NA 136 11/29/2023   K 4.5 11/29/2023   CL 101 11/29/2023   CREATININE 0.87 11/29/2023   BUN 21 11/29/2023   CO2 27 11/29/2023   TSH 3.10 11/29/2023   INR 1.4 (H) 07/03/2019   HGBA1C 5.5 03/13/2008   MICROALBUR 0.8 12/03/2006    MM 3D SCREENING MAMMOGRAM BILATERAL BREAST  Result Date: 07/13/2023 CLINICAL DATA:  Screening. EXAM: DIGITAL SCREENING BILATERAL  MAMMOGRAM WITH TOMOSYNTHESIS AND CAD TECHNIQUE: Bilateral screening digital craniocaudal and mediolateral oblique mammograms were obtained. Bilateral screening digital breast tomosynthesis was performed. The images were evaluated with computer-aided detection. COMPARISON:  Previous exam(s). ACR Breast Density Category b: There are scattered areas of fibroglandular density. FINDINGS: There are no findings suspicious for malignancy. IMPRESSION: No mammographic evidence of malignancy. A result letter of this screening mammogram will be mailed directly to the patient. RECOMMENDATION: Screening mammogram in one year. (Code:[id number]-01Y) BI-RADS CATEGORY  1: Negative. Electronically Signed   By: Cordelia Pen.D.  On: 07/13/2023 13:20    Assessment & Plan:   Dyslipidemia, goal LDL below 100 - LDL goal achieved. Doing well on the statin  -     Lipid panel; Future -     TSH; Future -     Hepatic function panel; Future  Acquired hypothyroidism- She is euthyroid. -     TSH; Future -     Levothyroxine Sodium; Take 1 tablet (50 mcg total) by mouth daily before breakfast.  Dispense: 45 tablet; Refill: 0  Iron deficiency anemia secondary to inadequate dietary iron intake -     IBC + Ferritin; Future -     CBC with Differential/Platelet; Future  Stage 3a chronic kidney disease (HCC)- Her renal function has improved slightly. -     Urinalysis, Routine w reflex microscopic; Future -     Basic metabolic panel; Future  Encounter for general adult medical examination with abnormal findings- Exam completed, labs reviewed, vaccines reviewed, no cancer screenings indicated, pt ed material was given.   Vitamin D deficiency -     VITAMIN D 25 Hydroxy (Vit-D Deficiency, Fractures); Future  Asymptomatic microscopic hematuria -     Ambulatory referral to Urology  Psychophysiological insomnia -     Zolpidem Tartrate; Take 1 tablet (10 mg total) by mouth at bedtime as needed. for sleep  Dispense: 30 tablet;  Refill: 2     Follow-up: Return in about 6 months (around 05/29/2024).  Sanda Linger, MD

## 2023-11-30 MED ORDER — ZOLPIDEM TARTRATE 10 MG PO TABS: 10.0000 mg | ORAL_TABLET | Freq: Every evening | ORAL | 2 refills | Status: DC | PRN

## 2023-12-12 ENCOUNTER — Other Ambulatory Visit: Payer: Self-pay | Admitting: Internal Medicine

## 2023-12-12 DIAGNOSIS — E039 Hypothyroidism, unspecified: Secondary | ICD-10-CM

## 2023-12-18 ENCOUNTER — Ambulatory Visit (HOSPITAL_COMMUNITY)
Admission: RE | Admit: 2023-12-18 | Discharge: 2023-12-18 | Disposition: A | Discharge status: Home / Self Care | Payer: Medicare Other | Source: Ambulatory Visit | Attending: Internal Medicine | Admitting: Internal Medicine

## 2023-12-18 VITALS — BP 114/76 | HR 68 | Ht 65.0 in | Wt 201.2 lb

## 2023-12-18 DIAGNOSIS — I4819 Other persistent atrial fibrillation: Secondary | ICD-10-CM

## 2023-12-18 DIAGNOSIS — Z7901 Long term (current) use of anticoagulants: Secondary | ICD-10-CM | POA: Diagnosis not present

## 2023-12-18 DIAGNOSIS — D6869 Other thrombophilia: Secondary | ICD-10-CM | POA: Diagnosis not present

## 2023-12-18 DIAGNOSIS — Z79899 Other long term (current) drug therapy: Secondary | ICD-10-CM | POA: Diagnosis not present

## 2023-12-18 DIAGNOSIS — I4891 Unspecified atrial fibrillation: Secondary | ICD-10-CM | POA: Diagnosis not present

## 2023-12-18 DIAGNOSIS — I441 Atrioventricular block, second degree: Secondary | ICD-10-CM | POA: Insufficient documentation

## 2023-12-18 DIAGNOSIS — Z5181 Encounter for therapeutic drug level monitoring: Secondary | ICD-10-CM | POA: Diagnosis not present

## 2023-12-18 DIAGNOSIS — I4892 Unspecified atrial flutter: Secondary | ICD-10-CM | POA: Diagnosis present

## 2023-12-18 MED ORDER — METOPROLOL TARTRATE 25 MG PO TABS: 12.5000 mg | ORAL_TABLET | Freq: Two times a day (BID) | ORAL | 3 refills | Status: DC

## 2023-12-18 MED ORDER — FLECAINIDE ACETATE 50 MG PO TABS: 25.0000 mg | ORAL_TABLET | Freq: Two times a day (BID) | ORAL | 3 refills | Status: DC

## 2023-12-18 MED ORDER — RIVAROXABAN 20 MG PO TABS: 20.0000 mg | ORAL_TABLET | Freq: Every day | ORAL | 3 refills | Status: DC
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
## 2023-12-25 DIAGNOSIS — L821 Other seborrheic keratosis: Secondary | ICD-10-CM | POA: Diagnosis not present

## 2024-03-01 ENCOUNTER — Other Ambulatory Visit (HOSPITAL_COMMUNITY): Payer: Self-pay | Admitting: Adult Health

## 2024-03-01 DIAGNOSIS — E785 Hyperlipidemia, unspecified: Secondary | ICD-10-CM

## 2024-03-06 ENCOUNTER — Encounter: Payer: Self-pay | Admitting: Cardiovascular Disease

## 2024-03-06 ENCOUNTER — Ambulatory Visit: Payer: Medicare Other | Attending: Cardiovascular Disease | Admitting: Cardiovascular Disease

## 2024-03-06 VITALS — BP 148/82 | HR 66 | Ht 65.0 in | Wt 196.4 lb

## 2024-03-06 DIAGNOSIS — Z79899 Other long term (current) drug therapy: Secondary | ICD-10-CM | POA: Diagnosis not present

## 2024-03-06 DIAGNOSIS — Z5181 Encounter for therapeutic drug level monitoring: Secondary | ICD-10-CM | POA: Diagnosis not present

## 2024-03-06 DIAGNOSIS — I4819 Other persistent atrial fibrillation: Secondary | ICD-10-CM

## 2024-03-06 DIAGNOSIS — I447 Left bundle-branch block, unspecified: Secondary | ICD-10-CM

## 2024-03-06 MED ORDER — METOPROLOL TARTRATE 25 MG PO TABS: 12.5000 mg | ORAL_TABLET | Freq: Two times a day (BID) | ORAL | 3 refills | Status: DC

## 2024-03-06 MED ORDER — FLECAINIDE ACETATE 50 MG PO TABS: 25.0000 mg | ORAL_TABLET | Freq: Two times a day (BID) | ORAL | 3 refills | Status: DC

## 2024-03-06 NOTE — Progress Notes (Signed)
 Electrophysiology Office Note:    Date:  03/06/2024   ID:  [name], [name] 1941/07/27, MRN [id number]  PCP:  Etta Grandchild, MD   [facility] Providers Cardiologist:  None     Referring MD: Etta Grandchild, MD   History of Present Illness:    [name] is a 83 y.o. female with a medical history significant for atrial flutter, persistent atrial fibrillation, frequent PVCs, bilateral pulmonary emboli, referred for rhythm management.      She was admitted in 11/13 with acute bilateral PE, rapid AF and heart failure after a long plane trip. On 10/28/12 she underwent an unsuccessful DCCV . Loaded amio and underwent repeat DC-CV on 11/13. In 11/13 she had atrial flutter and amiodarone was increased to 400 bid. On 12/10/12 successful DC-CV.  Amiodarone was eventually stopped. Had recurrent AF in 9/15. Amiodarone was restarted and she was cardioverted to NSR on 09/14/14. Seen again on Oct 16 and she was back in AFL with RVR at 150. Underwent DC-CV but reverted to AFL. S/p A flutter ablation 12/15/2014. Was seen by Dr Johney Frame in follow up last 01/08/15 and was stable at that time with no symptoms. She was in NSR at that time. Metoprolol stopped. Admitted 10/31/16-11/02/16 with SOB, AFib. Started on flecainide.   She has tolerated the flecainide well and not had any syncope, presyncope, dizziness.  She did have an episode of paroxysmal fatigue while traveling last month that she is certain was recurrence of atrial fibrillation.      Today, she reports that she is doing well and has no complaints  EKGs/Labs/Other Studies Reviewed Today:     Echocardiogram:  -- --   Monitors:  14 day monitor September 2020-- my interpretation Sinus rhythm heart rate 40 to 97 bpm, average 57 Less than 1% ectopy.  No symptom episodes reported   Advanced imaging:  Coronary CT September 2020 Calcium score 15 percentile for age and gender.    EKG:         Physical Exam:     VS:  BP (!) 148/82 (BP Location: Left Arm, Patient Position: Sitting, Cuff Size: Large)   Pulse 66   Ht 5\' 5"  (1.651 m)   Wt 196 lb 6.4 oz (89.1 kg)   SpO2 97%   BMI 32.68 kg/m     Wt Readings from Last 3 Encounters:  03/06/24 196 lb 6.4 oz (89.1 kg)  12/18/23 201 lb 3.2 oz (91.3 kg)  11/29/23 197 lb 6.4 oz (89.5 kg)     GEN: Well nourished, well developed in no acute distress CARDIAC: RRR, no murmurs, rubs, gallops RESPIRATORY:  Normal work of breathing MUSCULOSKELETAL: no edema    ASSESSMENT & PLAN:     Persistent atrial fibrillation Symptomatic with fatigue No dizziness, lightheadedness, presyncope with flecainide She has had incomplete right bundle branch block prior to starting flecainide, low bundle branch block currently I do not think that she should remain on flecainide due to advanced conduction disease and an episode of suspected recurrence We discussed alternatives to flecainide at length including admission for Tikosyn load and the ablation procedure.  I explained that there is some increased risk at her age for both options, and both options are reasonable Will update echocardiogram to assess atrial size  We discussed the indication, rationale, logistics, anticipated benefits, and potential risks of the ablation procedure including but not limited to -- bleed at the groin access site, chest pain, damage to nearby organs such as  the diaphragm, lungs, or esophagus, need for a drainage tube, or prolonged hospitalization. I explained that the risk for stroke, heart attack, need for open chest surgery, or even death is very low but not zero. she  expressed understanding and wishes to proceed.   Secondary hypercoagulable state CHA2DS2-VASc score is 3 Continue Xarelto 20 tarry milligrams  History of Mobitz 1 AV block Baseline first-degree AV  left bundle branch block  incomplete right bundle branch block prior to onset of left bundle branch block  PVCs Seen on  prior EKGs    Signed, Maurice Small, MD  03/06/2024 10:01 AM    [facility]

## 2024-03-06 NOTE — Patient Instructions (Signed)
 Medication Instructions:  Your physician recommends that you continue on your current medications as directed. Please refer to the Current Medication list given to you today. *If you need a refill on your cardiac medications before your next appointment, please call your pharmacy*   Testing/Procedures: Atrial Fibrillation Ablation - please contact our office if you decide to move forward with ablation  Your physician has recommended that you have an ablation. Catheter ablation is a medical procedure used to treat some cardiac arrhythmias (irregular heartbeats). During catheter ablation, a long, thin, flexible tube is put into a blood vessel in your groin (upper thigh), or neck. This tube is called an ablation catheter. It is then guided to your heart through the blood vessel. Radio frequency waves destroy small areas of heart tissue where abnormal heartbeats may cause an arrhythmia to start. Please see the instruction sheet given to you today.   Follow-Up: At [facility], you and your health needs are our priority.  As part of our continuing mission to provide you with exceptional heart care, we have created designated Provider Care Teams.  These Care Teams include your primary Cardiologist (physician) and Advanced Practice Providers (APPs -  Physician Assistants and Nurse Practitioners) who all work together to provide you with the care you need, when you need it.  We recommend signing up for the patient portal called "MyChart".  Sign up information is provided on this After Visit Summary.  MyChart is used to connect with patients for Virtual Visits (Telemedicine).  Patients are able to view lab/test results, encounter notes, upcoming appointments, etc.  Non-urgent messages can be sent to your provider as well.   To learn more about what you can do with MyChart, go to [email or web address].    Your next appointment:   6 months   Provider:   York Pellant, MD

## 2024-03-07 ENCOUNTER — Telehealth: Payer: Self-pay

## 2024-03-07 DIAGNOSIS — I4819 Other persistent atrial fibrillation: Secondary | ICD-10-CM

## 2024-03-07 NOTE — Telephone Encounter (Signed)
 Spoke with patient, she agrees to proceed to have a repeat echocardiogram. Will send staff message to have this scheduled.

## 2024-03-07 NOTE — Telephone Encounter (Signed)
 Attempted to contact patient, Dr Nelly Laurence would like for her to have a repeat echocardiogram. Left message to call our office back

## 2024-03-14 ENCOUNTER — Ambulatory Visit (HOSPITAL_COMMUNITY): Attending: Cardiology

## 2024-03-14 ENCOUNTER — Telehealth (HOSPITAL_COMMUNITY): Payer: Self-pay | Admitting: Radiology

## 2024-03-14 DIAGNOSIS — I4819 Other persistent atrial fibrillation: Secondary | ICD-10-CM | POA: Diagnosis not present

## 2024-03-14 LAB — ECHOCARDIOGRAM COMPLETE
AR max vel: 1.5 cm2
AV Area VTI: 1.5 cm2
AV Area mean vel: 1.5 cm2
AV Mean grad: 12.2 mmHg
AV Peak grad: 21.9 mmHg
Ao pk vel: 2.34 m/s
Area-P 1/2: 2.64 cm2
S' Lateral: 2.6 cm

## 2024-03-14 NOTE — Telephone Encounter (Signed)
 Patient is here for her echocardiogram. She noted on her medication list that Levothyroxine is listed as 1 tablet a day. She actually takes 1/2 tablet a day. She wanted to make sure this was known.

## 2024-03-20 DIAGNOSIS — R3121 Asymptomatic microscopic hematuria: Secondary | ICD-10-CM | POA: Diagnosis not present

## 2024-03-20 DIAGNOSIS — R350 Frequency of micturition: Secondary | ICD-10-CM | POA: Diagnosis not present

## 2024-04-15 DIAGNOSIS — K429 Umbilical hernia without obstruction or gangrene: Secondary | ICD-10-CM | POA: Diagnosis not present

## 2024-04-15 DIAGNOSIS — R3129 Other microscopic hematuria: Secondary | ICD-10-CM | POA: Diagnosis not present

## 2024-04-15 DIAGNOSIS — D3502 Benign neoplasm of left adrenal gland: Secondary | ICD-10-CM | POA: Diagnosis not present

## 2024-04-15 DIAGNOSIS — R3121 Asymptomatic microscopic hematuria: Secondary | ICD-10-CM | POA: Diagnosis not present

## 2024-04-15 DIAGNOSIS — K5730 Diverticulosis of large intestine without perforation or abscess without bleeding: Secondary | ICD-10-CM | POA: Diagnosis not present

## 2024-04-22 DIAGNOSIS — H524 Presbyopia: Secondary | ICD-10-CM | POA: Diagnosis not present

## 2024-04-22 DIAGNOSIS — H2511 Age-related nuclear cataract, right eye: Secondary | ICD-10-CM | POA: Diagnosis not present

## 2024-05-02 ENCOUNTER — Ambulatory Visit (INDEPENDENT_AMBULATORY_CARE_PROVIDER_SITE_OTHER): Admitting: Family Medicine

## 2024-05-02 ENCOUNTER — Ambulatory Visit: Payer: Self-pay

## 2024-05-02 ENCOUNTER — Encounter: Payer: Self-pay | Admitting: Family Medicine

## 2024-05-02 VITALS — BP 130/68 | HR 73 | Temp 98.0°F | Ht 65.0 in | Wt 195.0 lb

## 2024-05-02 DIAGNOSIS — R141 Gas pain: Secondary | ICD-10-CM

## 2024-05-02 NOTE — Telephone Encounter (Addendum)
  Chief Complaint: abdominal pain Symptoms: moderate mid abdomen pressure/"gas" radiates around abdomen Frequency: intermittent last 30-40 minutes since yesterday Pertinent Negatives: Patient denies chest pain, SOB, nausea, vomiting, diarrhea, fever, bloody or black tarry stool Disposition: [] ED /[] Urgent Care (no appt availability in office) / [x] Appointment(In office/virtual)/ []  [facility]/ [] Home Care/ [] Refused Recommended Disposition /[] [facility]/ []  Follow-up with PCP Additional Notes: Patient states she was seen for this issue last year and told it was gas. She would like to be seen again today as she is not sure if it is just gas again.  Copied from CRM [id number]. Topic: Clinical - Red Word Triage >> May 02, 2024  8:06 AM Ivette P wrote: Red Word that prompted transfer to Nurse Triage: Pain in the stomach area, unsure if its gas. Reason for Disposition  [1] MODERATE pain (e.g., interferes with normal activities) AND [2] pain comes and goes (cramps) AND [3] present > 24 hours  (Exception: Pain with Vomiting or Diarrhea - see that Guideline.)  Answer Assessment - Initial Assessment Questions 1. LOCATION: "Where does it hurt?"      Mid abdomen.  2. RADIATION: "Does the pain shoot anywhere else?" (e.g., chest, back)     Moves around from waist to right to left to middle of abdomen.  3. ONSET: "When did the pain begin?" (e.g., minutes, hours or days ago)      Teacher, English as a foreign language.  4. SUDDEN: "Gradual or sudden onset?"     Sudden.  5. PATTERN "Does the pain come and go, or is it constant?"    - If it comes and goes: "How long does it last?" "Do you have pain now?"     (Note: Comes and goes means the pain is intermittent. It goes away completely between bouts.)    - If constant: "Is it getting better, staying the same, or getting worse?"      (Note: Constant means the pain never goes away completely; most serious pain is constant and gets worse.)      Comes and goes,  can last 30-40 minutes.  6. SEVERITY: "How bad is the pain?"  (e.g., Scale 1-10; mild, moderate, or severe)    - MILD (1-3): Doesn't interfere with normal activities, abdomen soft and not tender to touch.     - MODERATE (4-7): Interferes with normal activities or awakens from sleep, abdomen tender to touch.     - SEVERE (8-10): Excruciating pain, doubled over, unable to do any normal activities.       She states it feels like moderate pressure and gas. Present now.  7. RECURRENT SYMPTOM: "Have you ever had this type of stomach pain before?" If Yes, ask: "When was the last time?" and "What happened that time?"      Yes, she states this happened about a year ago and was told it was gas.  8. CAUSE: "What do you think is causing the stomach pain?"     "I just have no idea".  9. RELIEVING/AGGRAVATING FACTORS: "What makes it better or worse?" (e.g., antacids, bending or twisting motion, bowel movement)     Lying flat seems to help.  10. OTHER SYMPTOMS: "Do you have any other symptoms?" (e.g., back pain, diarrhea, fever, urination pain, vomiting)       Denies.  11. PREGNANCY: "Is there any chance you are pregnant?" "When was your last menstrual period?"       N/A.  Protocols used: Abdominal Pain - Female-A-AH

## 2024-05-02 NOTE — Patient Instructions (Signed)
 Gas x for symptoms.  Follow-up with me for new or worsening symptoms.

## 2024-05-02 NOTE — Progress Notes (Signed)
   Acute Office Visit  Subjective:     Patient ID: [name], female    DOB: 10-11-1941, 83 y.o.   MRN: [id number]  Chief Complaint  Patient presents with   Acute Visit    Pt states abdominal pain, gas moves around since yesterday.    HPI Patient is in today for 1 day history of intermittent abdominal pain.  Reports pain begins in the right upper quadrant, then will move to mid abdomen then left side and then lower abdomen. States that pain is relieved with burping, flatulence, bowel movements. Denies any melena, hematochezia, nausea, vomiting, hematemesis, chills, fever, other symptoms. Denies any new changes in foods or recent exposures. Medical history as outlined below.  ROS Per HPI      Objective:    BP 130/68 (BP Location: Left Arm, Patient Position: Sitting)   Pulse 73   Temp 98 F (36.7 C) (Oral)   Ht 5\' 5"  (1.651 m)   Wt 195 lb (88.5 kg)   SpO2 97%   BMI 32.45 kg/m    Physical Exam Vitals and nursing note reviewed.  Constitutional:      General: She is not in acute distress.    Appearance: Normal appearance.  HENT:     Head: Normocephalic and atraumatic.     Right Ear: External ear normal.     Left Ear: External ear normal.     Nose: Nose normal.  Eyes:     Extraocular Movements: Extraocular movements intact.  Cardiovascular:     Rate and Rhythm: Normal rate.  Pulmonary:     Effort: Pulmonary effort is normal.  Abdominal:     General: Abdomen is flat. Bowel sounds are normal. There is no distension.     Palpations: Abdomen is soft. There is no mass.     Tenderness: There is no abdominal tenderness. There is no right CVA tenderness, left CVA tenderness, guarding or rebound.     Hernia: No hernia is present.  Musculoskeletal:        General: Normal range of motion.     Cervical back: Normal range of motion.     Right lower leg: No edema.     Left lower leg: No edema.  Lymphadenopathy:     Cervical: No cervical adenopathy.   Neurological:     General: No focal deficit present.     Mental Status: She is alert and oriented to person, place, and time.  Psychiatric:        Mood and Affect: Mood normal.        Thought Content: Thought content normal.     No results found for any visits on 05/02/24.      Assessment & Plan:   Abdominal gas pain  Benign exam, reassuring.   Discussed how to treat gas OTC Follow-up Monday with no improvement  No orders of the defined types were placed in this encounter.   Return if symptoms worsen or fail to improve.  Wellington Half, FNP

## 2024-05-13 ENCOUNTER — Other Ambulatory Visit: Payer: Self-pay

## 2024-05-13 ENCOUNTER — Encounter (HOSPITAL_COMMUNITY): Payer: Self-pay

## 2024-05-13 ENCOUNTER — Encounter (HOSPITAL_COMMUNITY): Payer: Self-pay | Admitting: *Deleted

## 2024-05-13 ENCOUNTER — Ambulatory Visit (HOSPITAL_BASED_OUTPATIENT_CLINIC_OR_DEPARTMENT_OTHER)
Admission: RE | Admit: 2024-05-13 | Discharge: 2024-05-13 | Disposition: A | Discharge status: Home / Self Care | Source: Ambulatory Visit | Attending: Family Medicine | Admitting: Family Medicine

## 2024-05-13 ENCOUNTER — Emergency Department (HOSPITAL_COMMUNITY)

## 2024-05-13 ENCOUNTER — Ambulatory Visit (HOSPITAL_COMMUNITY): Payer: Self-pay | Admitting: Family Medicine

## 2024-05-13 ENCOUNTER — Inpatient Hospital Stay (HOSPITAL_COMMUNITY)
Admission: EM | Admit: 2024-05-13 | Discharge: 2024-05-16 | DRG: 243 | Disposition: A | Discharge status: Home / Self Care | Attending: Cardiovascular Disease | Admitting: Cardiovascular Disease

## 2024-05-13 VITALS — BP 166/80 | HR 41 | Wt 194.4 lb

## 2024-05-13 DIAGNOSIS — Z6832 Body mass index (BMI) 32.0-32.9, adult: Secondary | ICD-10-CM | POA: Diagnosis not present

## 2024-05-13 DIAGNOSIS — E785 Hyperlipidemia, unspecified: Secondary | ICD-10-CM | POA: Diagnosis not present

## 2024-05-13 DIAGNOSIS — I495 Sick sinus syndrome: Secondary | ICD-10-CM | POA: Diagnosis not present

## 2024-05-13 DIAGNOSIS — I509 Heart failure, unspecified: Secondary | ICD-10-CM | POA: Insufficient documentation

## 2024-05-13 DIAGNOSIS — R001 Bradycardia, unspecified: Secondary | ICD-10-CM

## 2024-05-13 DIAGNOSIS — Z96653 Presence of artificial knee joint, bilateral: Secondary | ICD-10-CM | POA: Diagnosis not present

## 2024-05-13 DIAGNOSIS — I447 Left bundle-branch block, unspecified: Secondary | ICD-10-CM | POA: Insufficient documentation

## 2024-05-13 DIAGNOSIS — Z86711 Personal history of pulmonary embolism: Secondary | ICD-10-CM

## 2024-05-13 DIAGNOSIS — Z9842 Cataract extraction status, left eye: Secondary | ICD-10-CM

## 2024-05-13 DIAGNOSIS — E039 Hypothyroidism, unspecified: Secondary | ICD-10-CM | POA: Diagnosis not present

## 2024-05-13 DIAGNOSIS — Z8249 Family history of ischemic heart disease and other diseases of the circulatory system: Secondary | ICD-10-CM

## 2024-05-13 DIAGNOSIS — I441 Atrioventricular block, second degree: Secondary | ICD-10-CM | POA: Insufficient documentation

## 2024-05-13 DIAGNOSIS — Z79899 Other long term (current) drug therapy: Secondary | ICD-10-CM | POA: Diagnosis not present

## 2024-05-13 DIAGNOSIS — E669 Obesity, unspecified: Secondary | ICD-10-CM | POA: Diagnosis present

## 2024-05-13 DIAGNOSIS — I456 Pre-excitation syndrome: Secondary | ICD-10-CM | POA: Insufficient documentation

## 2024-05-13 DIAGNOSIS — I110 Hypertensive heart disease with heart failure: Secondary | ICD-10-CM | POA: Diagnosis present

## 2024-05-13 DIAGNOSIS — Z7901 Long term (current) use of anticoagulants: Secondary | ICD-10-CM | POA: Diagnosis not present

## 2024-05-13 DIAGNOSIS — Z885 Allergy status to narcotic agent status: Secondary | ICD-10-CM

## 2024-05-13 DIAGNOSIS — I4819 Other persistent atrial fibrillation: Secondary | ICD-10-CM | POA: Diagnosis not present

## 2024-05-13 DIAGNOSIS — I493 Ventricular premature depolarization: Secondary | ICD-10-CM | POA: Insufficient documentation

## 2024-05-13 DIAGNOSIS — Z880 Allergy status to penicillin: Secondary | ICD-10-CM | POA: Diagnosis not present

## 2024-05-13 DIAGNOSIS — I5032 Chronic diastolic (congestive) heart failure: Secondary | ICD-10-CM | POA: Diagnosis present

## 2024-05-13 DIAGNOSIS — Z79890 Hormone replacement therapy (postmenopausal): Secondary | ICD-10-CM

## 2024-05-13 DIAGNOSIS — Z9049 Acquired absence of other specified parts of digestive tract: Secondary | ICD-10-CM | POA: Diagnosis not present

## 2024-05-13 DIAGNOSIS — Z860100 Personal history of colon polyps, unspecified: Secondary | ICD-10-CM | POA: Diagnosis not present

## 2024-05-13 DIAGNOSIS — Z950 Presence of cardiac pacemaker: Secondary | ICD-10-CM | POA: Diagnosis not present

## 2024-05-13 DIAGNOSIS — I442 Atrioventricular block, complete: Secondary | ICD-10-CM | POA: Diagnosis not present

## 2024-05-13 DIAGNOSIS — I480 Paroxysmal atrial fibrillation: Secondary | ICD-10-CM | POA: Insufficient documentation

## 2024-05-13 DIAGNOSIS — Z86718 Personal history of other venous thrombosis and embolism: Secondary | ICD-10-CM | POA: Diagnosis not present

## 2024-05-13 DIAGNOSIS — R531 Weakness: Secondary | ICD-10-CM | POA: Diagnosis not present

## 2024-05-13 LAB — BASIC METABOLIC PANEL WITH GFR
Anion gap: 14 (ref 5–15)
BUN: 19 mg/dL (ref 8–23)
CO2: 21 mmol/L — ABNORMAL LOW (ref 22–32)
Calcium: 9.4 mg/dL (ref 8.9–10.3)
Chloride: 104 mmol/L (ref 98–111)
Creatinine, Ser: 0.97 mg/dL (ref 0.44–1.00)
GFR, Estimated: 58 mL/min — ABNORMAL LOW (ref >60)
Glucose, Bld: 105 mg/dL — ABNORMAL HIGH (ref 70–99)
Potassium: 4.1 mmol/L (ref 3.5–5.1)
Sodium: 139 mmol/L (ref 135–145)

## 2024-05-13 LAB — CBC WITH DIFFERENTIAL/PLATELET
Abs Immature Granulocytes: 0.02 10*3/uL (ref 0.00–0.07)
Basophils Absolute: 0.1 10*3/uL (ref 0.0–0.1)
Basophils Relative: 1 %
Eosinophils Absolute: 0.1 10*3/uL (ref 0.0–0.5)
Eosinophils Relative: 1 %
HCT: 38.9 % (ref 36.0–46.0)
Hemoglobin: 12.5 g/dL (ref 12.0–15.0)
Immature Granulocytes: 0 %
Lymphocytes Relative: 10 %
Lymphs Abs: 0.9 10*3/uL (ref 0.7–4.0)
MCH: 29.8 pg (ref 26.0–34.0)
MCHC: 32.1 g/dL (ref 30.0–36.0)
MCV: 92.6 fL (ref 80.0–100.0)
Monocytes Absolute: 0.5 10*3/uL (ref 0.1–1.0)
Monocytes Relative: 6 %
Neutro Abs: 6.7 10*3/uL (ref 1.7–7.7)
Neutrophils Relative %: 82 %
Platelets: 254 10*3/uL (ref 150–400)
RBC: 4.2 MIL/uL (ref 3.87–5.11)
RDW: 13 % (ref 11.5–15.5)
WBC: 8.3 10*3/uL (ref 4.0–10.5)
nRBC: 0 % (ref 0.0–0.2)

## 2024-05-13 LAB — TSH: TSH: 2.818 u[IU]/mL (ref 0.350–4.500)

## 2024-05-13 LAB — TROPONIN I (HIGH SENSITIVITY): Troponin I (High Sensitivity): 9 ng/L (ref <18)

## 2024-05-13 LAB — LACTIC ACID, PLASMA: Lactic Acid, Venous: 1 mmol/L (ref 0.5–1.9)

## 2024-05-13 LAB — MAGNESIUM: Magnesium: 2.1 mg/dL (ref 1.7–2.4)

## 2024-05-13 MED ORDER — MELATONIN 5 MG PO TABS
10.0000 mg | ORAL_TABLET | Freq: Every evening | ORAL | Status: DC | PRN
  Filled 2024-05-13: qty 2

## 2024-05-13 MED ORDER — ROSUVASTATIN CALCIUM 5 MG PO TABS
10.0000 mg | ORAL_TABLET | Freq: Every day | ORAL | Status: DC
  Administered 2024-05-14 – 2024-05-16 (×3): 10 mg via ORAL
  Filled 2024-05-13 (×3): qty 2

## 2024-05-13 MED ORDER — LEVOTHYROXINE SODIUM 50 MCG PO TABS
50.0000 ug | ORAL_TABLET | Freq: Every day | ORAL | Status: DC
  Administered 2024-05-14 – 2024-05-16 (×3): 50 ug via ORAL
  Filled 2024-05-13 (×3): qty 1

## 2024-05-13 MED ORDER — HEPARIN (PORCINE) 25000 UT/250ML-% IV SOLN
1000.0000 [IU]/h | INTRAVENOUS | Status: AC
  Administered 2024-05-13 – 2024-05-14 (×2): 1100 [IU]/h via INTRAVENOUS
  Filled 2024-05-13 (×2): qty 250

## 2024-05-13 MED ORDER — ZOLPIDEM TARTRATE 5 MG PO TABS
5.0000 mg | ORAL_TABLET | Freq: Every evening | ORAL | Status: DC | PRN
  Filled 2024-05-13: qty 1

## 2024-05-13 NOTE — Addendum Note (Signed)
 Encounter addended by: Elmarie Hacking, FNP on: 05/13/2024 12:06 PM  Actions taken: Clinical Note Signed

## 2024-05-13 NOTE — ED Triage Notes (Addendum)
 BIB POV from home by way of HF clinic. Sent from HF clinic for bradycardia. HR 35-40. Zoll pads in place remain. VSS. C/o fatigue. Denies pain, sob, dizziness, sweating, nausea or other sx. Alert, NAD, calm, interactive, skin W&D.

## 2024-05-13 NOTE — ED Notes (Signed)
Cards MD at BS ?

## 2024-05-13 NOTE — ED Notes (Signed)
 EDP at [facility]

## 2024-05-13 NOTE — ED Notes (Signed)
 Up to b/r with stand by assist, no changes, denies sx, "feel better", steady gait.

## 2024-05-13 NOTE — Progress Notes (Addendum)
 Patient ID: [name], female   DOB: 1941-05-27, 83 y.o.   MRN: [id number]    Advanced Heart Failure Clinic Note    PCP: Arcadio Knuckles, MD EP:  Dr. Arlester Ladd HF Cardiologist: Dr Julane Ny   HPI: [name] is an  83 y.o. woman with h/o of obesity, PAF and AFL, s/p AFL ablation.   She was admitted in 11/13 with acute bilateral PE, rapid AF and heart failure after a long plane trip. EF 40-45%.  On 10/28/12 Unsuccessful DCCV . Loaded amio and underwent repeat DC-CV on 11/13. Maintained on Xarelto . In 11/13 had AFL.  Amio increased to 400 bid. On 12/10/12 successful DC-CV.  Amiodarone  was eventually stopped.   Echo 4/14 EF 65%, mild LVH, normal RV size and systolic function, PA systolic pressure 35 mmHg .Had recurrent AF in 9/15. Amiodarone  was restarted and she was cardioverted to NSR on 09/14/14. Seen again on Oct 16. Asymptomatic. Was back in AFL with RVR at 150. Underwent DC-CV but reverted to AFL. S/p A flutter ablation 12/15/2014. Was seen by Dr Nunzio Belch in follow up last 01/08/15 and was stable at that time with no symptoms. She was in NSR at that time. Metoprolol  stopped.   Admitted 10/31/16-11/02/16 with SOB, AFib. Started on flecainide .   Echo 12/19 EF 55-60%   In 9/20 we saw her for unexplained dyspnea. REDS 31% Zio patch ok (no AF). Cardiac CT mild plaque prox LAD otherwise normal.   Follow up 4/24, doing well enjoying water aerobics. Follow up with EP 3/25, felt not to be a flecainide  candidate due to advanced conduction disease, medication continued. Discussed AF ablation.   Echo 3/25 showed EF 55-60%, normal RV, moderate TR, moderately dilated LA  Today she returns for yearly HF follow up. HR 41, ECG showed high grade AV block. She is symptomatic with profound fatigue. Alert and oriented, BP elevated.  Reviewed ECG with Dr. Mitzie Anda, will take her to ED for monitoring. No charge for today's visit.    ROS: All systems negative except as listed in HPI, PMH and Problem  List.  Past Medical History:  Diagnosis Date   1st degree AV block    Atrial flutter (HCC)    a. s/p CTI ablation 11/2014   Cataract    Diverticulosis 2002   Dr. Sandrea Cruel   DVT (deep venous thrombosis) (HCC) 2013   "S/P a long airplane ride"   Endometrial polyp    Fasting hyperglycemia    A1C  within normal limits   Hyperlipidemia    Hypothyroidism    LBBB (left bundle branch block)    LVH (left ventricular hypertrophy) 2014   Mild, Noted on ECHO   Obese    Pancreatitis due to common bile duct stone    Persistent atrial fibrillation (HCC)    RVR   Personal history of colonic polyps 2002   tubular adenoma   PMB (postmenopausal bleeding)    Pulmonary embolism (HCC) 2013   "S/P a long airplane ride"   Sinus bradycardia    Tortuous colon    Vitamin D  deficiency    Wears glasses     Current Outpatient Medications  Medication Sig Dispense Refill   Calcium  Carbonate-Vit D-Min (CALCIUM  1200 PO) Take 1 tablet by mouth at bedtime.     flecainide  (TAMBOCOR ) 50 MG tablet Take 0.5 tablets (25 mg total) by mouth 2 (two) times daily. 90 tablet 3   levothyroxine  (SYNTHROID ) 50 MCG tablet Take 1 tablet (50 mcg total) by mouth daily  before breakfast. 90 tablet 1   magnesium  gluconate (MAGONATE) 500 MG tablet Take 500 mg by mouth daily.     Melatonin 10 MG TABS Take 1 tablet by mouth.     metoprolol  tartrate (LOPRESSOR ) 25 MG tablet Take 0.5 tablets (12.5 mg total) by mouth 2 (two) times daily. 90 tablet 3   Potassium 99 MG TABS Take 1 tablet by mouth at bedtime.     rivaroxaban  (XARELTO ) 20 MG TABS tablet Take 1 tablet (20 mg total) by mouth daily with supper. 90 tablet 3   rosuvastatin  (CRESTOR ) 10 MG tablet Take 1 tablet (10 mg total) by mouth daily. 90 tablet 3   zolpidem  (AMBIEN ) 10 MG tablet Take 1 tablet (10 mg total) by mouth at bedtime as needed. for sleep 30 tablet 2   No current facility-administered medications for this encounter.   Wt Readings from Last 3 Encounters:   05/13/24 88.2 kg (194 lb 6.4 oz)  05/02/24 88.5 kg (195 lb)  03/06/24 89.1 kg (196 lb 6.4 oz)   BP (!) 166/80   Pulse (!) 41   Wt 88.2 kg (194 lb 6.4 oz)   SpO2 98%   BMI 32.35 kg/m   PHYSICAL EXAM: General:  NAD. No resp difficulty, arrived in [facility], elderly, fatigued appearing HEENT: Normal Extremities: No cyanosis, clubbing, rash, edema Neuro: Alert & oriented x 3, moves all 4 extremities w/o difficulty. Affect pleasant.  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  Paroxysmal Afib RVR:  - Has had history of recurrent AFL despite DC-CV (x 2 in 2013 and x 1 in 2015), - s/p AFL ablation by Dr Nunzio Belch. - Long history of AF/AFL by EKGs.  - s/p DCCV 9/17. - Recurrent AF in 11/17. - Now in High grade AV block.  - stop flecainide  and lopressor , needs wash out and eval in ED - I will notify EP.  Zoll pads placed, myself and Molly, RN personally wheeled her to ED triage on Zoll.  [facility] FNP-BC  11:23 AM 05/13/2024

## 2024-05-13 NOTE — Progress Notes (Signed)
 HR remaining 20-30s. Lowest HR noted at 26, and seems to be in 20s more frequent now. Second Degree AV HB. C/O fatigue, but denies dizziness, shortness of breath or any other symptoms. BP fluctuating. BP at 2100- 86/70 map 77. Rechecked and bp at 2125 - 163/61.  Contacted Dr Jacklyn Mash to notify. See new orders.

## 2024-05-13 NOTE — ED Provider Notes (Signed)
 [place] EMERGENCY DEPARTMENT AT [facility] Provider Note   CSN: [id number] Arrival date & time: 05/13/24  1144     History  Chief Complaint  Patient presents with   Bradycardia    [name] is a 83 y.o. female.  She has a history of atrial fibrillation and is flecainide  and Xarelto .  She is here with a complaint of 1 week of general weakness.  No syncope chest pain headache shortness of breath.  No recent medication changes.  Went to cardiology office today where they found her to be profoundly bradycardic for further evaluation.  Concern for heart block.  The history is provided by the patient.  Weakness Severity:  Severe Onset quality:  Gradual Duration:  1 week Timing:  Constant Progression:  Unchanged Chronicity:  New Context: not change in medication   Relieved by:  Nothing Worsened by:  Activity Ineffective treatments:  Rest Associated symptoms: no abdominal pain, no cough, no diarrhea, no falls, no fever, no shortness of breath and no vomiting        Home Medications Prior to Admission medications   Medication Sig Start Date End Date Taking? Authorizing Provider  Calcium  Carbonate-Vit D-Min (CALCIUM  1200 PO) Take 1 tablet by mouth at bedtime.    [provider]  flecainide  (TAMBOCOR ) 50 MG tablet Take 0.5 tablets (25 mg total) by mouth 2 (two) times daily. 03/06/24   Mealor, Augustus E, MD  levothyroxine  (SYNTHROID ) 50 MCG tablet Take 1 tablet (50 mcg total) by mouth daily before breakfast. 12/12/23   Arcadio Knuckles, MD  magnesium  gluconate (MAGONATE) 500 MG tablet Take 500 mg by mouth daily.    [provider]  Melatonin 10 MG TABS Take 1 tablet by mouth.    [provider]  metoprolol  tartrate (LOPRESSOR ) 25 MG tablet Take 0.5 tablets (12.5 mg total) by mouth 2 (two) times daily. 03/06/24   Mealor, Augustus E, MD  Potassium 99 MG TABS Take 1 tablet by mouth at bedtime.    [provider]  rivaroxaban   (XARELTO ) 20 MG TABS tablet Take 1 tablet (20 mg total) by mouth daily with supper. 12/18/23   Nathanel Bal, PA-C  rosuvastatin  (CRESTOR ) 10 MG tablet Take 1 tablet (10 mg total) by mouth daily. 03/04/24   Bensimhon, Rheta Celestine, MD  zolpidem  (AMBIEN ) 10 MG tablet Take 1 tablet (10 mg total) by mouth at bedtime as needed. for sleep 11/30/23   Arcadio Knuckles, MD      Allergies    Demerol [meperidine hcl], Meperidine hcl, and Penicillins    Review of Systems   Review of Systems  Constitutional:  Positive for fatigue. Negative for fever.  Respiratory:  Negative for cough and shortness of breath.   Gastrointestinal:  Negative for abdominal pain, diarrhea and vomiting.  Musculoskeletal:  Negative for falls.  Neurological:  Positive for weakness.    Physical Exam Updated Vital Signs BP (!) 195/75 (BP Location: Right Arm)   Pulse (!) 36   Temp 98.2 F (36.8 C) (Oral)   Resp 16   Ht 5\' 5"  (1.651 m)   Wt 88 kg   SpO2 100%   BMI 32.28 kg/m  Physical Exam Vitals and nursing note reviewed.  Constitutional:      General: She is not in acute distress.    Appearance: Normal appearance. She is well-developed.  HENT:     Head: Normocephalic and atraumatic.  Eyes:     Conjunctiva/sclera: Conjunctivae normal.  Cardiovascular:  Rate and Rhythm: Regular rhythm. Bradycardia present.     Heart sounds: No murmur heard. Pulmonary:     Effort: Pulmonary effort is normal. No respiratory distress.     Breath sounds: Normal breath sounds. No stridor. No wheezing.  Abdominal:     Palpations: Abdomen is soft.     Tenderness: There is no abdominal tenderness. There is no guarding or rebound.  Musculoskeletal:        General: No deformity.     Cervical back: Neck supple.     Right lower leg: No edema.     Left lower leg: No edema.  Skin:    General: Skin is warm and dry.  Neurological:     General: No focal deficit present.     Mental Status: She is alert.     GCS: GCS eye subscore is 4.  GCS verbal subscore is 5. GCS motor subscore is 6.     Sensory: No sensory deficit.     Motor: No weakness.     ED Results / Procedures / Treatments   Labs (all labs ordered are listed, but only abnormal results are displayed) Labs Reviewed  BASIC METABOLIC PANEL WITH GFR - Abnormal; Notable for the following components:      Result Value   CO2 21 (*)    Glucose, Bld 105 (*)    GFR, Estimated 58 (*)    All other components within normal limits  CBC WITH DIFFERENTIAL/PLATELET  MAGNESIUM   TSH  URINALYSIS, ROUTINE W REFLEX MICROSCOPIC  APTT  TROPONIN I (HIGH SENSITIVITY)    EKG None EKG not crossing in epic. second-degree AV block. Radiology DG Chest Port 1 View Result Date: 05/13/2024 CLINICAL DATA:  Weakness and bradycardia. EXAM: PORTABLE CHEST 1 VIEW COMPARISON:  Chest radiograph dated 12/03/2018 FINDINGS: No focal consolidation, pleural effusion or pneumothorax. Stable cardiac silhouette. No acute osseous pathology. IMPRESSION: No active disease. Electronically Signed   By: Angus Bark M.D.   On: 05/13/2024 15:02    Procedures .Critical Care  Performed by: Tonya Fredrickson, MD Authorized by: Tonya Fredrickson, MD   Critical care provider statement:    Critical care time (minutes):  30   Critical care was necessary to treat or prevent imminent or life-threatening deterioration of the following conditions:  Cardiac failure   Critical care was time spent personally by me on the following activities:  Development of treatment plan with patient or surrogate, discussions with consultants, evaluation of patient's response to treatment, examination of patient, ordering and review of laboratory studies, ordering and review of radiographic studies, ordering and performing treatments and interventions, pulse oximetry, re-evaluation of patient's condition and review of old charts     Medications Ordered in ED Medications  rosuvastatin  (CRESTOR ) tablet 10 mg (has no  administration in time range)  zolpidem  (AMBIEN ) tablet 5 mg (has no administration in time range)  levothyroxine  (SYNTHROID ) tablet 50 mcg (has no administration in time range)  melatonin tablet 10 mg (has no administration in time range)  heparin  ADULT infusion 100 units/mL (25000 units/250mL) (has no administration in time range)    ED Course/ Medical Decision Making/ A&P Clinical Course as of 05/13/24 1700  Tue May 13, 2024  1234 Chest x-ray interpreted by me as no clear infiltrate.  Awaiting radiology reading. [MB]  1306 Discussed with Trish in cardiology who would have somebody from the team come to evaluate patient. [MB]  1306 She is on pacer pads but [MB]  1306  is asymptomatic at  this time despite her heart rate ranging from 30-50. [MB]    Clinical Course User Index [MB] Tonya Fredrickson, MD                                 Medical Decision Making Amount and/or Complexity of Data Reviewed Labs: ordered. Radiology: ordered.  Risk Decision regarding hospitalization.   This patient complains of general fatigue low heart rate; this involves an extensive number of treatment Options and is a complaint that carries with it a high risk of complications and morbidity. The differential includes bradycardia, arrhythmia, dehydration, infection, ACS  I ordered, reviewed and interpreted labs, which included CBC normal, chemistries with mildly low bicarb, troponins flat, TSH normal I ordered imaging studies which included Chest x-ray and I independently    visualized and interpreted imaging which showed no acute findings Previous records obtained and reviewed in epic including cardiology note from today I consulted cardiology and discussed lab and imaging findings and discussed disposition.  Cardiac monitoring reviewed, second-degree AV block Social determinants considered, no significant barriers Critical Interventions: Continuation of pacer pads and availability for intervention  if becomes symptomatic  After the interventions stated above, I reevaluated the patient and found still to be bradycardic although fairly comfortable at rest Admission and further testing considered, cardiology is admitting to their service for further management         Final Clinical Impression(s) / ED Diagnoses Final diagnoses:  AV block, 2nd degree  Symptomatic bradycardia    Rx / DC Orders ED Discharge Orders     None         Tonya Fredrickson, MD 05/13/24 1705

## 2024-05-13 NOTE — Progress Notes (Addendum)
 PHARMACY - ANTICOAGULATION CONSULT NOTE  Pharmacy Consult for heparin   Indication: atrial fibrillation  Allergies  Allergen Reactions   Demerol [Meperidine Hcl] Rash   Meperidine Hcl Rash   Penicillins Rash    Rash as child    Patient Measurements: Height: 5\' 5"  (165.1 cm) Weight: 88 kg (194 lb) IBW/kg (Calculated) : 57 HEPARIN  DW (KG): 76.3  Vital Signs: Temp: 98.2 F (36.8 C) (05/20 1152) Temp Source: Oral (05/20 1152) BP: 130/98 (05/20 1345) Pulse Rate: 30 (05/20 1345)  Labs: Recent Labs    05/13/24 1215  HGB 12.5  HCT 38.9  PLT 254  CREATININE 0.97  TROPONINIHS 9    Estimated Creatinine Clearance: 48.1 mL/min (by C-G formula based on SCr of 0.97 mg/dL).   Medical History: Past Medical History:  Diagnosis Date   1st degree AV block    Atrial flutter (HCC)    a. s/p CTI ablation 11/2014   Cataract    Diverticulosis 2002   Dr. Sandrea Cruel   DVT (deep venous thrombosis) (HCC) 2013   "S/P a long airplane ride"   Endometrial polyp    Fasting hyperglycemia    A1C  within normal limits   Hyperlipidemia    Hypothyroidism    LBBB (left bundle branch block)    LVH (left ventricular hypertrophy) 2014   Mild, Noted on ECHO   Obese    Pancreatitis due to common bile duct stone    Persistent atrial fibrillation (HCC)    RVR   Personal history of colonic polyps 2002   tubular adenoma   PMB (postmenopausal bleeding)    Pulmonary embolism (HCC) 2013   "S/P a long airplane ride"   Sinus bradycardia    Tortuous colon    Vitamin D  deficiency    Wears glasses    Assessment: Patient admitted with CC of symptomatic bradycardia / AV block with hx of Afib on Xarelto  PTA. Last Xarelto  dose 5/19 @ 20:00 per patient. HgB 12.5 and PLTs 254. Pharmacy consulted to dose heparin .  Goal of Therapy:  Heparin  level 0.3-0.7 units/ml aPTT 66-102 Monitor platelets by anticoagulation protocol: Yes   Plan:  No bolus 2/2 recent DOAC intake.  Start heparin  infusion at 1100  units/hr @ 20:00, 24 hours from last Xarelto  dose.  Check aPTT in 8 hours and daily while on heparin  - no anti-Xa for now, anticipate it to be elevated with recent Xarelto .  Continue to monitor H&H and platelets  Mamie Searles, PharmD, BCCCP  05/13/2024,2:37 PM

## 2024-05-13 NOTE — Addendum Note (Signed)
 Encounter addended by: Elmarie Hacking, FNP on: 05/13/2024 12:05 PM  Actions taken: Clinical Note Signed
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
## 2024-05-13 NOTE — Addendum Note (Signed)
 Encounter addended by: Elmarie Hacking, FNP on: 05/13/2024 11:58 AM  Actions taken: Clinical Note Signed

## 2024-05-14 DIAGNOSIS — I441 Atrioventricular block, second degree: Secondary | ICD-10-CM | POA: Diagnosis not present

## 2024-05-14 LAB — APTT
aPTT: 60 s — ABNORMAL HIGH (ref 24–36)
aPTT: 111 s — ABNORMAL HIGH (ref 24–36)

## 2024-05-14 LAB — CBC
HCT: 37.1 % (ref 36.0–46.0)
Hemoglobin: 11.8 g/dL — ABNORMAL LOW (ref 12.0–15.0)
MCH: 30.2 pg (ref 26.0–34.0)
MCHC: 31.8 g/dL (ref 30.0–36.0)
MCV: 94.9 fL (ref 80.0–100.0)
Platelets: 204 10*3/uL (ref 150–400)
RBC: 3.91 MIL/uL (ref 3.87–5.11)
RDW: 13.1 % (ref 11.5–15.5)
WBC: 7.1 10*3/uL (ref 4.0–10.5)
nRBC: 0 % (ref 0.0–0.2)

## 2024-05-14 LAB — BASIC METABOLIC PANEL WITH GFR
Anion gap: 7 (ref 5–15)
BUN: 15 mg/dL (ref 8–23)
CO2: 23 mmol/L (ref 22–32)
Calcium: 8.8 mg/dL — ABNORMAL LOW (ref 8.9–10.3)
Chloride: 108 mmol/L (ref 98–111)
Creatinine, Ser: 0.9 mg/dL (ref 0.44–1.00)
GFR, Estimated: >60 mL/min
Glucose, Bld: 92 mg/dL (ref 70–99)
Potassium: 4 mmol/L (ref 3.5–5.1)
Sodium: 138 mmol/L (ref 135–145)

## 2024-05-14 NOTE — Plan of Care (Signed)
  Problem: Clinical Measurements: Goal: Cardiovascular complication will be avoided Outcome: Progressing   Problem: Education: Goal: Knowledge of General Education information will improve Description: Including pain rating scale, medication(s)/side effects and non-pharmacologic comfort measures Outcome: Progressing   Problem: Activity: Goal: Risk for activity intolerance will decrease Outcome: Progressing   Problem: Elimination: Goal: Will not experience complications related to urinary retention Outcome: Progressing   Problem: Pain Managment: Goal: General experience of comfort will improve and/or be controlled Outcome: Progressing   Problem: Safety: Goal: Ability to remain free from injury will improve Outcome: Progressing

## 2024-05-14 NOTE — H&P (View-Only) (Signed)
 Patient Name: [name] Date of Encounter: 05/15/2024   Primary Cardiologist: None Electrophysiologist: None  Interval Summary   Remains bradycardia. No new symptoms at rest.   Vital Signs    Vitals:   05/14/24 1928 05/14/24 2324 05/15/24 0500 05/15/24 0729  BP: (!) 159/54 (!) 155/51 (!) 169/55 (!) 133/56  Pulse: (!) 35 (!) 36 (!) 31   Resp: 18 19 19 19   Temp: 97.6 F (36.4 C) 98.5 F (36.9 C) 97.6 F (36.4 C) 98.1 F (36.7 C)  TempSrc: Oral Oral Oral   SpO2: 97% 95% 97% 100%  Weight:   86.2 kg   Height:        Intake/Output Summary (Last 24 hours) at 05/15/2024 0845 Last data filed at 05/14/2024 1821 Gross per 24 hour  Intake 207.85 ml  Output --  Net 207.85 ml   Filed Weights   05/13/24 1819 05/14/24 0408 05/15/24 0500  Weight: 87.5 kg 86.7 kg 86.2 kg    Physical Exam    GEN: Well nourished, well developed in no acute distress NECK: No JVD; No carotid bruits CARDIAC: Slow but regular rate and rhythm, no murmurs, rubs, gallops RESPIRATORY:  Clear to auscultation without rales, wheezing or rhonchi  ABDOMEN: Soft, non-tender, non-distended EXTREMITIES:  No edema; No deformity    Telemetry    2:1 AV block 20-40s (personally reviewed)  Hospital Course    [name] is a 83 y.o. female with a history of PAF, AFL s/p AFL ablation, HFpEF, PVCs, and h/o PEs  who is being seen today for the evaluation of symptomatic bradycardia / AV block at the request of Dr. Randal Bury.   Assessment & Plan    Second degree AV block, 2:1 Recent echo with normal EF Remains bradycardia; last dose of flecainide /lopressor  5/20 am.  Explained risks, benefits, and alternatives to PPM implantation, including but not limited to bleeding, infection, pneumothorax, pericardial effusion, lead dislodgement, heart attack, stroke, or death.  Pt verbalized understanding and agrees to proceed.    OAC held  Paroxysmal AF Paroxysmal AFL Feels like she had breakthrough on a  trip earlier this year, but it hindsight fatigue may have represented intermittent brady and not AF.  Holding Xarelto  for procedures   HFpEF EF normal 02/2024, grade 1 DD Volume status OK Follow   HTN Can adjust meds further s/p pacing.   Dr. Daneil Dunker to see and plan pacing this afternoon.   For questions or updates, please contact [facility] Please consult [email or web address] for contact info under Cardiology/STEMI.  Signed, Tylene Galla, PA-C  05/15/2024, 8:45 AM   I have seen, examined the patient, and reviewed the above assessment and plan.    Interval:  No acute overnight events. Remains bradycardic with 2:1 block.  General: Well developed, in no acute distress.  Neck: No JVD.  Cardiac: Bradycardic, regular rhythm.  Resp: Normal work of breathing.  Ext: No edema.  Neuro: No gross focal deficits.  Psych: Normal affect.   Assessment: [name] is a 83 y.o. female with a history of PAF, AFL s/p AFL ablation, HFpEF, PVCs, and h/o PEs who is admitted with symptomatic bradycardia and 2:1 AV block.  #. Symptomatic bradycardia #. Tachycardia-bradycardia syndrome #. 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 Explained risks, benefits, and alternatives to pacemaker implantation, including but not limited to bleeding, infection, damage to heart or lungs, heart  attack, stroke, or death.  Pt verbalized understanding and agrees to proceed with device implant today.    Ardeen Kohler, MD 05/15/2024 10:12 AM

## 2024-05-14 NOTE — Plan of Care (Signed)
  Problem: Nutrition: Goal: Adequate nutrition will be maintained Outcome: Completed/Met   Problem: Elimination: Goal: Will not experience complications related to urinary retention Outcome: Completed/Met   Problem: Pain Managment: Goal: General experience of comfort will improve and/or be controlled Outcome: Completed/Met   Problem: Skin Integrity: Goal: Risk for impaired skin integrity will decrease Outcome: Completed/Met

## 2024-05-14 NOTE — Progress Notes (Signed)
 Bp 177/47 map 80. Notified Dr Jacklyn Mash, and states is okay as long as systolic <180 and asymptomatic.

## 2024-05-14 NOTE — TOC CM/SW Note (Signed)
 Transition of Care [facility]) - Inpatient Brief Assessment   Patient Details  Name: [name] MRN: [id number] Date of Birth: Mar 06, 1941  [facility]) CM/SW Contact:    Jennett Model, RN Phone Number: 05/14/2024, 4:13 PM   Clinical Narrative: From home with spouse, has PCP and insurance on file, states has no HH services in place at this time or DME at home.  States family member will transport them home at [employer] and family is support system, states gets medications from [facility] [place].  Pta self ambulatory .  Patient gives this NCM permission to speak with her spouse.   Transition of Care Asessment: Insurance and Status: Insurance coverage has been reviewed Patient has primary care physician: Yes Home environment has been reviewed: home with spouse Prior level of function:: indep Prior/Current Home Services: No current home services Social Drivers of Health Review: SDOH reviewed no interventions necessary Readmission risk has been reviewed: Yes Transition of care needs: no transition of care needs at this time

## 2024-05-14 NOTE — Progress Notes (Signed)
  Patient Name: [name] Date of Encounter: 05/14/2024  Primary Cardiologist: None Electrophysiologist: None  Interval Summary   NAEO. Feeling OK in bed this am. Remains in heart block  Vital Signs    Vitals:   05/13/24 2344 05/14/24 0408 05/14/24 0524 05/14/24 0600  BP: (!) 178/56 (!) 172/59 (!) 177/47 (!) 137/46  Pulse: (!) 29 (!) 40  (!) 40  Resp: 20 20  18   Temp: 97.8 F (36.6 C) (!) 97.4 F (36.3 C)  (!) 97.4 F (36.3 C)  TempSrc: Oral Oral  Oral  SpO2: 99% 98%  98%  Weight:  86.7 kg    Height:        Intake/Output Summary (Last 24 hours) at 05/14/2024 0733 Last data filed at 05/14/2024 0600 Gross per 24 hour  Intake 340.61 ml  Output --  Net 340.61 ml   Filed Weights   05/13/24 1154 05/13/24 1819 05/14/24 0408  Weight: 88 kg 87.5 kg 86.7 kg    Physical Exam    GEN- The patient is elderly appearing, alert and oriented x 3 today.   Lungs- Clear to ausculation bilaterally, normal work of breathing Cardiac- Slow but regular rate and rhythm, no murmurs, rubs or gallops GI- soft, NT, ND, + BS Extremities- no clubbing or cyanosis. No edema  Telemetry    2:1 AVB 20s-40s, rare PVCs (personally reviewed)  Hospital Course    [name] is a 83 y.o. female with a history of PAF, AFL s/p AFL ablation, HFpEF, PVCs, and h/o PEs  who is being seen today for the evaluation of symptomatic bradycardia / AV block at the request of Dr. Randal Bury.   Assessment & Plan    Second degree AV block, 2:1 Recent echo with normal EF Remains bradycardia; last dose of flecainide /lopressor  5/20 am.  Will likely need pacing. Follow through day today for continued washout.    Paroxysmal AF Paroxysmal AFL Feels like she had breakthrough on a trip earlier this year, but it hindsight fatigue may have represented intermittent brady and not AF.  Holding Xarelto . On Heparin  for now   HFpEF EF normal 02/2024, grade 1 DD Volume status OK Follow   HTN In setting of  HB. Follow for now.   For questions or updates, please contact [facility] Please consult [email or web address] for contact info under Cardiology/STEMI.  Signed, Tylene Galla, PA-C  05/14/2024, 7:33 AM

## 2024-05-14 NOTE — Progress Notes (Signed)
 PHARMACY - ANTICOAGULATION CONSULT NOTE  Pharmacy Consult for heparin   Indication: atrial fibrillation  Allergies  Allergen Reactions   Demerol [Meperidine Hcl] Rash   Penicillins Rash    Rash as child    Patient Measurements: Height: 5\' 6"  (167.6 cm) Weight: 86.7 kg (191 lb 3.2 oz) IBW/kg (Calculated) : 59.3 HEPARIN  DW (KG): 78.1  Vital Signs: Temp: 97.5 F (36.4 C) (05/21 1123) Temp Source: Oral (05/21 1123) BP: 154/51 (05/21 1123) Pulse Rate: 34 (05/21 1123)  Labs: Recent Labs    05/13/24 1215 05/14/24 0320 05/14/24 0747 05/14/24 1236  HGB 12.5 11.8*  --   --   HCT 38.9 37.1  --   --   PLT 254 204  --   --   APTT  --  60*  --  111*  CREATININE 0.97  --  0.90  --   TROPONINIHS 9  --   --   --     Estimated Creatinine Clearance: 52.6 mL/min (by C-G formula based on SCr of 0.9 mg/dL).   Medical History: Past Medical History:  Diagnosis Date   1st degree AV block    Atrial flutter (HCC)    a. s/p CTI ablation 11/2014   Cataract    Diverticulosis 2002   Dr. Sandrea Cruel   DVT (deep venous thrombosis) (HCC) 2013   "S/P a long airplane ride"   Endometrial polyp    Fasting hyperglycemia    A1C  within normal limits   Hyperlipidemia    Hypothyroidism    LBBB (left bundle branch block)    LVH (left ventricular hypertrophy) 2014   Mild, Noted on ECHO   Obese    Pancreatitis due to common bile duct stone    Persistent atrial fibrillation (HCC)    RVR   Personal history of colonic polyps 2002   tubular adenoma   PMB (postmenopausal bleeding)    Pulmonary embolism (HCC) 2013   "S/P a long airplane ride"   Sinus bradycardia    Tortuous colon    Vitamin D  deficiency    Wears glasses    Assessment: Patient admitted with CC of symptomatic bradycardia / AV block with hx of Afib on Xarelto  PTA. Last Xarelto  dose 5/19 @ 20:00 per patient. HgB 12.5 and PLTs 254. Pharmacy consulted to dose heparin .  APTT this afternoon is slightly above goal at 111 seconds.  Goal  of Therapy:  Heparin  level 0.3-0.7 units/ml aPTT 66-102 Monitor platelets by anticoagulation protocol: Yes   Plan:  Decrease IV heparin  to 1100 units/hr Repeat aPTT in 8 hrs Daily aPTT, heparin  level and CBC.   Joanell Mowers, Davey Erp, BCCP Clinical Pharmacist  05/14/2024 2:00 PM   [facility] pharmacy phone numbers are listed on [email or web address]

## 2024-05-14 NOTE — Progress Notes (Addendum)
 Patient Name: [name] Date of Encounter: 05/15/2024   Primary Cardiologist: None Electrophysiologist: None  Interval Summary   Remains bradycardia. No new symptoms at rest.   Vital Signs    Vitals:   05/14/24 1928 05/14/24 2324 05/15/24 0500 05/15/24 0729  BP: (!) 159/54 (!) 155/51 (!) 169/55 (!) 133/56  Pulse: (!) 35 (!) 36 (!) 31   Resp: 18 19 19 19   Temp: 97.6 F (36.4 C) 98.5 F (36.9 C) 97.6 F (36.4 C) 98.1 F (36.7 C)  TempSrc: Oral Oral Oral   SpO2: 97% 95% 97% 100%  Weight:   86.2 kg   Height:        Intake/Output Summary (Last 24 hours) at 05/15/2024 0845 Last data filed at 05/14/2024 1821 Gross per 24 hour  Intake 207.85 ml  Output --  Net 207.85 ml   Filed Weights   05/13/24 1819 05/14/24 0408 05/15/24 0500  Weight: 87.5 kg 86.7 kg 86.2 kg    Physical Exam    GEN: Well nourished, well developed in no acute distress NECK: No JVD; No carotid bruits CARDIAC: Slow but regular rate and rhythm, no murmurs, rubs, gallops RESPIRATORY:  Clear to auscultation without rales, wheezing or rhonchi  ABDOMEN: Soft, non-tender, non-distended EXTREMITIES:  No edema; No deformity    Telemetry    2:1 AV block 20-40s (personally reviewed)  Hospital Course    [name] is a 83 y.o. female with a history of PAF, AFL s/p AFL ablation, HFpEF, PVCs, and h/o PEs  who is being seen today for the evaluation of symptomatic bradycardia / AV block at the request of Dr. Randal Bury.   Assessment & Plan    Second degree AV block, 2:1 Recent echo with normal EF Remains bradycardia; last dose of flecainide /lopressor  5/20 am.  Explained risks, benefits, and alternatives to PPM implantation, including but not limited to bleeding, infection, pneumothorax, pericardial effusion, lead dislodgement, heart attack, stroke, or death.  Pt verbalized understanding and agrees to proceed.    OAC held  Paroxysmal AF Paroxysmal AFL Feels like she had breakthrough on a  trip earlier this year, but it hindsight fatigue may have represented intermittent brady and not AF.  Holding Xarelto  for procedures   HFpEF EF normal 02/2024, grade 1 DD Volume status OK Follow   HTN Can adjust meds further s/p pacing.   Dr. Daneil Dunker to see and plan pacing this afternoon.   For questions or updates, please contact [facility] Please consult [email or web address] for contact info under Cardiology/STEMI.  Signed, Tylene Galla, PA-C  05/15/2024, 8:45 AM   I have seen, examined the patient, and reviewed the above assessment and plan.    Interval:  No acute overnight events. Remains bradycardic with 2:1 block.  General: Well developed, in no acute distress.  Neck: No JVD.  Cardiac: Bradycardic, regular rhythm.  Resp: Normal work of breathing.  Ext: No edema.  Neuro: No gross focal deficits.  Psych: Normal affect.   Assessment: [name] is a 83 y.o. female with a history of PAF, AFL s/p AFL ablation, HFpEF, PVCs, and h/o PEs who is admitted with symptomatic bradycardia and 2:1 AV block.  #. Symptomatic bradycardia #. Tachycardia-bradycardia syndrome #. 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 Explained risks, benefits, and alternatives to pacemaker implantation, including but not limited to bleeding, infection, damage to heart or lungs, heart  attack, stroke, or death.  Pt verbalized understanding and agrees to proceed with device implant today.    Ardeen Kohler, MD 05/15/2024 10:12 AM

## 2024-05-15 ENCOUNTER — Other Ambulatory Visit: Payer: Self-pay

## 2024-05-15 ENCOUNTER — Inpatient Hospital Stay (HOSPITAL_COMMUNITY)
Admission: EM | Disposition: A | Discharge status: Home / Self Care | Payer: Self-pay | Source: Home / Self Care | Attending: Cardiovascular Disease

## 2024-05-15 DIAGNOSIS — I441 Atrioventricular block, second degree: Secondary | ICD-10-CM | POA: Diagnosis not present

## 2024-05-15 HISTORY — PX: PACEMAKER IMPLANT: EP1218

## 2024-05-15 LAB — BASIC METABOLIC PANEL WITH GFR
Anion gap: 11 (ref 5–15)
BUN: 16 mg/dL (ref 8–23)
CO2: 23 mmol/L (ref 22–32)
Calcium: 8.9 mg/dL (ref 8.9–10.3)
Chloride: 106 mmol/L (ref 98–111)
Creatinine, Ser: 1.09 mg/dL — ABNORMAL HIGH (ref 0.44–1.00)
GFR, Estimated: 50 mL/min — ABNORMAL LOW (ref >60)
Glucose, Bld: 88 mg/dL (ref 70–99)
Potassium: 4 mmol/L (ref 3.5–5.1)
Sodium: 140 mmol/L (ref 135–145)

## 2024-05-15 LAB — CBC
HCT: 37.5 % (ref 36.0–46.0)
Hemoglobin: 12 g/dL (ref 12.0–15.0)
MCH: 29.8 pg (ref 26.0–34.0)
MCHC: 32 g/dL (ref 30.0–36.0)
MCV: 93.1 fL (ref 80.0–100.0)
Platelets: 218 10*3/uL (ref 150–400)
RBC: 4.03 MIL/uL (ref 3.87–5.11)
RDW: 13.1 % (ref 11.5–15.5)
WBC: 7.4 10*3/uL (ref 4.0–10.5)
nRBC: 0 % (ref 0.0–0.2)

## 2024-05-15 LAB — APTT: aPTT: 105 s — ABNORMAL HIGH (ref 24–36)

## 2024-05-15 LAB — SURGICAL PCR SCREEN
MRSA, PCR: NEGATIVE
Staphylococcus aureus: POSITIVE — AB

## 2024-05-15 SURGERY — PACEMAKER IMPLANT

## 2024-05-15 MED ORDER — CEFAZOLIN SODIUM-DEXTROSE 2-4 GM/100ML-% IV SOLN: 2.0000 g | INTRAVENOUS | Status: DC

## 2024-05-15 MED ORDER — CHLORHEXIDINE GLUCONATE 4 % EX SOLN
60.0000 mL | Freq: Once | CUTANEOUS | Status: AC
  Administered 2024-05-15: 4 via TOPICAL
  Filled 2024-05-15: qty 15

## 2024-05-15 MED ORDER — MIDAZOLAM HCL 2 MG/2ML IJ SOLN
INTRAMUSCULAR | Status: AC
  Filled 2024-05-15: qty 2

## 2024-05-15 MED ORDER — HYDRALAZINE HCL 20 MG/ML IJ SOLN
INTRAMUSCULAR | Status: DC | PRN
  Administered 2024-05-15: 5 mg via INTRAVENOUS

## 2024-05-15 MED ORDER — MUPIROCIN 2 % EX OINT
1.0000 | TOPICAL_OINTMENT | Freq: Two times a day (BID) | CUTANEOUS | Status: DC
  Administered 2024-05-15 – 2024-05-16 (×3): 1 via NASAL
  Filled 2024-05-15: qty 22

## 2024-05-15 MED ORDER — FENTANYL CITRATE (PF) 100 MCG/2ML IJ SOLN
INTRAMUSCULAR | Status: DC | PRN
  Administered 2024-05-15 (×3): 25 ug via INTRAVENOUS
  Administered 2024-05-15 (×2): 50 ug via INTRAVENOUS

## 2024-05-15 MED ORDER — LIDOCAINE HCL (PF) 1 % IJ SOLN
INTRAMUSCULAR | Status: DC | PRN
  Administered 2024-05-15: 60 mL

## 2024-05-15 MED ORDER — SODIUM CHLORIDE 0.9% FLUSH: 3.0000 mL | INTRAVENOUS | Status: DC | PRN

## 2024-05-15 MED ORDER — SODIUM CHLORIDE 0.9 % IV SOLN
INTRAVENOUS | Status: AC
  Administered 2024-05-15: 80 mg
  Filled 2024-05-15: qty 2

## 2024-05-15 MED ORDER — CHLORHEXIDINE GLUCONATE CLOTH 2 % EX PADS
6.0000 | MEDICATED_PAD | Freq: Every day | CUTANEOUS | Status: DC
  Administered 2024-05-15: 6 via TOPICAL

## 2024-05-15 MED ORDER — VANCOMYCIN HCL IN DEXTROSE 1-5 GM/200ML-% IV SOLN
1000.0000 mg | Freq: Two times a day (BID) | INTRAVENOUS | Status: AC
  Administered 2024-05-16: 1000 mg via INTRAVENOUS
  Filled 2024-05-15: qty 200

## 2024-05-15 MED ORDER — ACETAMINOPHEN 325 MG PO TABS
325.0000 mg | ORAL_TABLET | ORAL | Status: DC | PRN
  Administered 2024-05-15: 650 mg via ORAL
  Filled 2024-05-15: qty 2

## 2024-05-15 MED ORDER — LIDOCAINE HCL 1 % IJ SOLN
INTRAMUSCULAR | Status: AC
  Filled 2024-05-15: qty 60

## 2024-05-15 MED ORDER — FENTANYL CITRATE (PF) 100 MCG/2ML IJ SOLN
INTRAMUSCULAR | Status: AC
  Filled 2024-05-15: qty 2

## 2024-05-15 MED ORDER — ONDANSETRON HCL 4 MG/2ML IJ SOLN: 4.0000 mg | Freq: Four times a day (QID) | INTRAMUSCULAR | Status: DC | PRN

## 2024-05-15 MED ORDER — SODIUM CHLORIDE 0.9% FLUSH
3.0000 mL | Freq: Two times a day (BID) | INTRAVENOUS | Status: DC
  Administered 2024-05-15: 3 mL via INTRAVENOUS

## 2024-05-15 MED ORDER — SODIUM CHLORIDE 0.9 % IV SOLN: INTRAVENOUS | Status: DC

## 2024-05-15 MED ORDER — HYDRALAZINE HCL 20 MG/ML IJ SOLN
INTRAMUSCULAR | Status: AC
  Filled 2024-05-15: qty 1

## 2024-05-15 MED ORDER — CEFAZOLIN SODIUM-DEXTROSE 2-4 GM/100ML-% IV SOLN
INTRAVENOUS | Status: AC
  Filled 2024-05-15: qty 100

## 2024-05-15 MED ORDER — MIDAZOLAM HCL 5 MG/5ML IJ SOLN
INTRAMUSCULAR | Status: DC | PRN
  Administered 2024-05-15: 1 mg via INTRAVENOUS
  Administered 2024-05-15 (×2): .5 mg via INTRAVENOUS
  Administered 2024-05-15: 1 mg via INTRAVENOUS
  Administered 2024-05-15: .5 mg via INTRAVENOUS

## 2024-05-15 MED ORDER — SODIUM CHLORIDE 0.9 % IV SOLN
80.0000 mg | INTRAVENOUS | Status: AC
  Filled 2024-05-15: qty 2

## 2024-05-15 MED ORDER — VANCOMYCIN HCL IN DEXTROSE 1-5 GM/200ML-% IV SOLN
INTRAVENOUS | Status: AC
  Administered 2024-05-15: 1000 mg
  Filled 2024-05-15: qty 200

## 2024-05-15 MED ORDER — HEPARIN (PORCINE) IN NACL 1000-0.9 UT/500ML-% IV SOLN
INTRAVENOUS | Status: DC | PRN
  Administered 2024-05-15: 500 mL

## 2024-05-15 MED ORDER — CARVEDILOL 6.25 MG PO TABS
6.2500 mg | ORAL_TABLET | Freq: Two times a day (BID) | ORAL | Status: DC
  Administered 2024-05-15 – 2024-05-16 (×2): 6.25 mg via ORAL
  Filled 2024-05-15 (×2): qty 1

## 2024-05-15 MED ORDER — MIDAZOLAM HCL 2 MG/2ML IJ SOLN
INTRAMUSCULAR | Status: AC
Start: 2024-05-15 — End: ?
  Filled 2024-05-15: qty 2

## 2024-05-15 SURGICAL SUPPLY — 11 items
CABLE SURGICAL S-101-97-12 (CABLE) ×2 IMPLANT
CATH RIGHTSITE C315HIS02 (CATHETERS) IMPLANT
IPG PACE AZUR XT DR MRI W1DR01 (Pacemaker) IMPLANT
LEAD CAPSURE NOVUS 5076-52CM (Lead) IMPLANT
LEAD SELECT SECURE 3830 383069 (Lead) IMPLANT
PAD DEFIB RADIO PHYSIO CONN (PAD) ×2 IMPLANT
SHEATH 7FR PRELUDE SNAP 13 (SHEATH) IMPLANT
SHEATH PROBE COVER 6X72 (BAG) IMPLANT
SLITTER 6232ADJ (MISCELLANEOUS) IMPLANT
TRAY PACEMAKER INSERTION (PACKS) ×2 IMPLANT
WIRE HI TORQ VERSACORE-J 145CM (WIRE) IMPLANT

## 2024-05-15 NOTE — Plan of Care (Signed)
 Added care plan Problem: Cardiac: Goal: Ability to achieve and maintain adequate cardiopulmonary perfusion will improve Outcome: Progressing

## 2024-05-15 NOTE — Interval H&P Note (Signed)
 History and Physical Interval Note:  05/15/2024 3:24 PM  [name]  has presented today for surgery, with the diagnosis of symptomatic bradycardia, 2nd degree heart block.  The various methods of treatment have been discussed with the patient and family. After consideration of risks, benefits and other options for treatment, the patient has consented to  Procedure(s): PACEMAKER IMPLANT (N/A) as a surgical intervention.  The patient's history has been reviewed, patient examined, no change in status, stable for surgery.  I have reviewed the patient's chart and labs.  Questions were answered to the patient's satisfaction.     Ardeen Kohler

## 2024-05-15 NOTE — Progress Notes (Signed)
 PHARMACY - ANTICOAGULATION CONSULT NOTE  Pharmacy Consult for heparin   Indication: atrial fibrillation  Allergies  Allergen Reactions   Demerol [Meperidine Hcl] Rash   Penicillins Rash    Rash as child    Patient Measurements: Height: 5\' 6"  (167.6 cm) Weight: 86.7 kg (191 lb 3.2 oz) IBW/kg (Calculated) : 59.3 HEPARIN  DW (KG): 78.1  Vital Signs: Temp: 98.5 F (36.9 C) (05/21 2324) Temp Source: Oral (05/21 2324) BP: 155/51 (05/21 2324) Pulse Rate: 36 (05/21 2324)  Labs: Recent Labs    05/13/24 1215 05/14/24 0320 05/14/24 0747 05/14/24 1236 05/14/24 2229  HGB 12.5 11.8*  --   --   --   HCT 38.9 37.1  --   --   --   PLT 254 204  --   --   --   APTT  --  60*  --  111* 105*  CREATININE 0.97  --  0.90  --   --   TROPONINIHS 9  --   --   --   --     Estimated Creatinine Clearance: 52.6 mL/min (by C-G formula based on SCr of 0.9 mg/dL).  Assessment: Patient admitted with CC of symptomatic bradycardia / AV block with hx of Afib on Xarelto  PTA. Last Xarelto  dose 5/19 @ 20:00 per patient. HgB 12.5 and PLTs 254. Pharmacy consulted to dose heparin .  APTT is slightly above goal at 105 seconds after rate decrease to 1100 units/hr. No issues per RN.  Goal of Therapy:  Heparin  level 0.3-0.7 units/ml aPTT 66-102 Monitor platelets by anticoagulation protocol: Yes   Plan:  Decrease IV heparin  to 1000 units/hr Repeat aPTT in 8 hrs Daily aPTT, heparin  level and CBC.   Young Hensen, PharmD, BCPS Clinical Pharmacist 05/15/2024 12:18 AM

## 2024-05-16 ENCOUNTER — Encounter (HOSPITAL_COMMUNITY): Payer: Self-pay | Admitting: Cardiology

## 2024-05-16 ENCOUNTER — Other Ambulatory Visit (HOSPITAL_COMMUNITY): Payer: Self-pay

## 2024-05-16 ENCOUNTER — Inpatient Hospital Stay (HOSPITAL_COMMUNITY)

## 2024-05-16 LAB — CBC
HCT: 37.8 % (ref 36.0–46.0)
Hemoglobin: 12.2 g/dL (ref 12.0–15.0)
MCH: 29.8 pg (ref 26.0–34.0)
MCHC: 32.3 g/dL (ref 30.0–36.0)
MCV: 92.2 fL (ref 80.0–100.0)
Platelets: 216 10*3/uL (ref 150–400)
RBC: 4.1 MIL/uL (ref 3.87–5.11)
RDW: 12.9 % (ref 11.5–15.5)
WBC: 8.5 10*3/uL (ref 4.0–10.5)
nRBC: 0 % (ref 0.0–0.2)

## 2024-05-16 LAB — BASIC METABOLIC PANEL WITH GFR
Anion gap: 8 (ref 5–15)
BUN: 15 mg/dL (ref 8–23)
CO2: 23 mmol/L (ref 22–32)
Calcium: 8.9 mg/dL (ref 8.9–10.3)
Chloride: 106 mmol/L (ref 98–111)
Creatinine, Ser: 0.91 mg/dL (ref 0.44–1.00)
GFR, Estimated: >60 mL/min (ref >60)
Glucose, Bld: 124 mg/dL — ABNORMAL HIGH (ref 70–99)
Potassium: 3.8 mmol/L (ref 3.5–5.1)
Sodium: 137 mmol/L (ref 135–145)

## 2024-05-16 MED ORDER — CARVEDILOL 6.25 MG PO TABS
6.2500 mg | ORAL_TABLET | Freq: Two times a day (BID) | ORAL | 6 refills | Status: DC
  Filled 2024-05-16: qty 60, 30d supply, fill #0

## 2024-05-16 MED ORDER — ACETAMINOPHEN 325 MG PO TABS
325.0000 mg | ORAL_TABLET | ORAL | Status: AC | PRN
Start: 2024-05-16 — End: ?

## 2024-05-16 MED ORDER — CARVEDILOL 6.25 MG PO TABS: 6.2500 mg | ORAL_TABLET | Freq: Two times a day (BID) | ORAL | 6 refills | Status: DC

## 2024-05-16 MED FILL — Midazolam HCl Inj 2 MG/2ML (Base Equivalent): INTRAMUSCULAR | Qty: 3.5 | Status: AC

## 2024-05-16 NOTE — Discharge Summary (Addendum)
 ELECTROPHYSIOLOGY PROCEDURE DISCHARGE SUMMARY    Patient ID: [name],  MRN: [id number], DOB/AGE: 1941-01-22 83 y.o.  Admit date: 05/13/2024 Discharge date: 05/16/2024  Primary Care Physician: Arcadio Knuckles, MD  Primary Cardiologist: None  Electrophysiologist: Dr. 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  Implantation of a Medtronic Dual Chamber PPM on 05/15/2024  by Dr. Daneil Dunker. The patient received a Medtronic Azure XT DR [id number]  with a Medtronic CapSureFix Novus 5076 right atrial lead and a Medtronic SelectSure 3830 right ventricular lead.  There were no immediate post procedure complications.   2.  CXR on 05/16/2024 demonstrated no pneumothorax status post device implantation.       Brief HPI: [name] is a 83 y.o. female was admitted for symptomatic bradycardia in the setting of second degree AV block and electrophysiology team asked to see for consideration of PPM implantation.  Past medical history includes above.  The patient has had AV block without reversible causes identified. Failed to improve despite holding flecainide  and beta blocker. Risks, benefits, and alternatives to PPM implantation were reviewed with the patient who wished to proceed.   Hospital Course:  The patient was admitted and underwent implantation of a Medtronic dual chamber PPM with details as outlined above.  She was monitored on telemetry overnight which demonstrated appropriate pacing.  Left chest was without hematoma or ecchymosis.  The device was interrogated and found to be functioning normally.  CXR was obtained and demonstrated no pneumothorax status post  device implantation.  Wound care, arm mobility, and restrictions were reviewed with the patient.  The patient was examined and considered stable for discharge to home.    Anticoagulation resumption This patient should resume their Xarelto  on Tuesday May 20, 2024    Physical Exam: Vitals:   05/16/24 0000 05/16/24 0330 05/16/24 0521 05/16/24 0725  BP: 128/68 131/64  139/69  Pulse:  70    Resp: 18 20  18   Temp: 98.2 F (36.8 C) 97.6 F (36.4 C)  97.6 F (36.4 C)  TempSrc: Oral Oral  Oral  SpO2: 96% 95%  96%  Weight:   86.2 kg   Height:        GEN- NAD. A&O x 3.  HEENT: Normocephalic, atraumatic Lungs- CTAB, Normal effort.  Heart- RRR, No M/G/R.  GI- Soft, NT, ND.  Extremities- No clubbing, cyanosis, or edema;  Skin- warm and dry, no rash or lesion, left chest without hematoma/ecchymosis  Discharge Medications:  Allergies as of 05/16/2024       Reactions   Demerol [meperidine Hcl] Rash   Penicillins Rash   Rash as child        Medication List     PAUSE taking these medications    rivaroxaban  20 MG Tabs tablet Wait to take this until: May 20, 2024 Evening Commonly known as: Xarelto  Take 1 tablet (20 mg total) by mouth daily with supper.       STOP taking these medications    metoprolol  tartrate 25 MG tablet Commonly known as: LOPRESSOR        TAKE these medications    acetaminophen  325 MG tablet Commonly known as: TYLENOL  Take 1-2 tablets (  325-650 mg total) by mouth every 4 (four) hours as needed for mild pain (pain score 1-3).   CALCIUM  1200 PO Take 1 tablet by mouth at bedtime.   carvedilol 6.25 MG tablet Commonly known as: COREG Take 1 tablet (6.25 mg total) by mouth 2 (two) times daily with a meal.   flecainide  50 MG tablet Commonly known as: TAMBOCOR  Take 0.5 tablets (25 mg total) by mouth 2 (two) times daily.   levothyroxine  50 MCG tablet Commonly known as: SYNTHROID  Take 1 tablet (50 mcg total) by mouth daily before breakfast.    magnesium  gluconate 500 MG tablet Commonly known as: MAGONATE Take 500 mg by mouth daily.   Melatonin 10 MG Tabs Take 1 tablet by mouth.   Potassium 99 MG Tabs Take 1 tablet by mouth at bedtime.   rosuvastatin  10 MG tablet Commonly known as: CRESTOR  Take 1 tablet (10 mg total) by mouth daily.   zolpidem  10 MG tablet Commonly known as: AMBIEN  Take 1 tablet (10 mg total) by mouth at bedtime as needed. for sleep        Disposition:  Home with usual follow up as in AVS  Signed, Gloriann Larger  05/16/2024 7:39 AM  I have seen, examined the patient, and reviewed the above assessment and plan.    Hospital Course: Patient was admitted for symptomatic bradycardia in the setting of 2-1 AV block.  She underwent dual-chamber pacemaker implant on 05/15/2024.  She was monitored overnight with no acute complications.  This morning, she reported feeling relatively well.  She had some left chest soreness which improved with removal of her pressure dressing.  General: Well developed, in no acute distress.  Neck: No JVD.  Cardiac: Normal rate, regular rhythm.  Left chest pacer pocket without hematoma or bleeding. Resp: Normal work of breathing.  Ext: No edema.  Neuro: No gross focal deficits.  Psych: Normal affect.   Plan:  - Chest x-ray with appropriate lead position and no pneumothorax. -Bedside device interrogation was performed with appropriate device function and stable lead parameters. -Usual post implant discharge teaching was provided regarding activity restrictions and wound care. - Patient will resume Eliquis in 5 days.  She is in normal rhythm. - She will resume her home flecainide  and we have changed metoprolol  to carvedilol given her hypertension.  Duration of Discharge Encounter: 45 minutes  Ardeen Kohler, MD 05/16/2024 11:07 PM

## 2024-05-16 NOTE — Progress Notes (Signed)
Discharge instructions provided to patient. All medications, discharge instructions, and follow up appointments discussed. IV out. Monitor off CCMD notified. Discharging home.

## 2024-05-16 NOTE — Care Management Important Message (Signed)
 Important Message  Patient Details  Name: [name] MRN: [id number] Date of Birth: 03-20-1941   Important Message Given:  Yes - Medicare IM     Wynonia Hedges 05/16/2024, 3:52 PM

## 2024-05-16 NOTE — Care Management Important Message (Signed)
 Important Message  Patient Details  Name: [name] MRN: [id number] Date of Birth: 1941/07/03   Important Message Given:  Yes - Medicare IM     Wynonia Hedges 05/16/2024, 10:50 AM

## 2024-05-16 NOTE — Care Management Important Message (Signed)
 Important Message  Patient Details  Name: [name] MRN: [id number] Date of Birth: 1941/08/16   Important Message Given:  Yes - Medicare IM     Wynonia Hedges 05/16/2024, 10:51 AM

## 2024-05-16 NOTE — Plan of Care (Signed)
  Problem: Health Behavior/Discharge Planning: Goal: Ability to manage health-related needs will improve Outcome: Progressing   Problem: Clinical Measurements: Goal: Respiratory complications will improve Outcome: Progressing Goal: Cardiovascular complication will be avoided Outcome: Progressing   Problem: Activity: Goal: Risk for activity intolerance will decrease Outcome: Progressing   Problem: Coping: Goal: Level of anxiety will decrease Outcome: Progressing   Problem: Elimination: Goal: Will not experience complications related to bowel motility Outcome: Progressing   Problem: Safety: Goal: Ability to remain free from injury will improve Outcome: Progressing   Problem: Education: Goal: Knowledge of cardiac device and self-care will improve Outcome: Progressing Goal: Ability to safely manage health related needs after discharge will improve Outcome: Progressing   Problem: Cardiac: Goal: Ability to achieve and maintain adequate cardiopulmonary perfusion will improve Outcome: Progressing   Problem: Education: Goal: Knowledge of cardiac device and self-care will improve Outcome: Progressing   Problem: Cardiac: Goal: Ability to achieve and maintain adequate cardiopulmonary perfusion will improve Outcome: Progressing

## 2024-05-16 NOTE — Plan of Care (Signed)
  Problem: Education: Goal: Knowledge of General Education information will improve Description: Including pain rating scale, medication(s)/side effects and non-pharmacologic comfort measures Outcome: Adequate for Discharge   Problem: Health Behavior/Discharge Planning: Goal: Ability to manage health-related needs will improve Outcome: Adequate for Discharge   Problem: Clinical Measurements: Goal: Ability to maintain clinical measurements within normal limits will improve Outcome: Adequate for Discharge Goal: Will remain free from infection Outcome: Adequate for Discharge Goal: Diagnostic test results will improve Outcome: Adequate for Discharge Goal: Respiratory complications will improve Outcome: Adequate for Discharge Goal: Cardiovascular complication will be avoided Outcome: Adequate for Discharge   Problem: Activity: Goal: Risk for activity intolerance will decrease Outcome: Adequate for Discharge   Problem: Coping: Goal: Level of anxiety will decrease Outcome: Adequate for Discharge   Problem: Elimination: Goal: Will not experience complications related to bowel motility Outcome: Adequate for Discharge   Problem: Safety: Goal: Ability to remain free from injury will improve Outcome: Adequate for Discharge   Problem: Education: Goal: Knowledge of cardiac device and self-care will improve Outcome: Adequate for Discharge Goal: Ability to safely manage health related needs after discharge will improve Outcome: Adequate for Discharge Goal: Individualized Educational Video(s) Outcome: Adequate for Discharge   Problem: Cardiac: Goal: Ability to achieve and maintain adequate cardiopulmonary perfusion will improve Outcome: Adequate for Discharge   Problem: Education: Goal: Knowledge of cardiac device and self-care will improve Outcome: Adequate for Discharge Goal: Ability to safely manage health related needs after discharge will improve Outcome: Adequate for  Discharge Goal: Individualized Educational Video(s) Outcome: Adequate for Discharge   Problem: Cardiac: Goal: Ability to achieve and maintain adequate cardiopulmonary perfusion will improve Outcome: Adequate for Discharge

## 2024-05-16 NOTE — TOC Transition Note (Signed)
 Transition of Care [facility]) - Discharge Note   Patient Details  Name: [name] MRN: [id number] Date of Birth: [birth year]  Transition of [facility]) CM/SW Contact:  Jennett Model, RN Phone Number: 05/16/2024, 8:16 AM   Clinical Narrative:    For dc today, she has transport,  TOC pharmacy to fill meds.           Patient Goals and CMS Choice            Discharge Placement                       Discharge Plan and Services Additional resources added to the After Visit Summary for                                       Social Drivers of Health (SDOH) Interventions SDOH Screenings   Food Insecurity: No Food Insecurity (05/14/2024)  Housing: Low Risk  (05/14/2024)  Transportation Needs: No Transportation Needs (05/14/2024)  Utilities: Not At Risk (05/14/2024)  Alcohol Screen: Low Risk  (09/06/2023)  Depression (PHQ2-9): Low Risk  (09/06/2023)  Financial Resource Strain: Low Risk  (09/06/2023)  Physical Activity: Sufficiently Active (09/06/2023)  Social Connections: Socially Integrated (05/14/2024)  Stress: No Stress Concern Present (09/06/2023)  Tobacco Use: Low Risk  (05/13/2024)  Health Literacy: Adequate Health Literacy (09/06/2023)     Readmission Risk Interventions    05/14/2024    4:12 PM  Readmission Risk Prevention Plan  Medication Screening Complete  Transportation Screening Complete

## 2024-05-16 NOTE — Discharge Instructions (Signed)
 After Your Pacemaker   You have a Medtronic Pacemaker  ACTIVITY Do not lift your arm above shoulder height for 1 week after your procedure. After 7 days, you may progress as below.  You should remove your sling 24 hours after your procedure, unless otherwise instructed by your provider.     Friday May 23, 2024  Saturday May 24, 2024 Sunday May 25, 2024 Monday May 26, 2024   Do not lift, push, pull, or carry anything over 10 pounds with the affected arm until 6 weeks (Friday June 27, 2024 ) after your procedure.   You may drive AFTER your wound check, unless you have been told otherwise by your provider.   Ask your healthcare provider when you can go back to work   INCISION/Dressing Resume Xarelto  Tuesday, 5/27 with the evening dose.   If large square, outer bandage is left in place, this can be removed after 24 hours from your procedure. Do not remove steri-strips or glue as below.   If a PRESSURE DRESSING (a bulky dressing that usually goes up over your shoulder) was applied or left in place, please follow instructions given by your provider on when to return to have this removed.   Monitor your Pacemaker site for redness, swelling, and drainage. Call the device clinic at [phone] if you experience these symptoms or fever/chills.  If your incision is sealed with Steri-strips or staples, you may shower 7 days after your procedure or when told by your provider. Do not remove the steri-strips or let the shower hit directly on your site. You may wash around your site with soap and water.    If you were discharged in a sling, please do not wear this during the day more than 48 hours after your surgery unless otherwise instructed. This may increase the risk of stiffness and soreness in your shoulder.   Avoid lotions, ointments, or perfumes over your incision until it is well-healed.  You may use a hot tub or a pool AFTER your wound check appointment if the incision is completely  closed.  Pacemaker Alerts:  Some alerts are vibratory and others beep. These are NOT emergencies. Please call our office to let us  know. If this occurs at night or on weekends, it can wait until the next business day. Send a remote transmission.  If your device is capable of reading fluid status (for heart failure), you will be offered monthly monitoring to review this with you.   DEVICE MANAGEMENT Remote monitoring is used to monitor your pacemaker from home. This monitoring is scheduled every 91 days by our office. It allows us  to keep an eye on the functioning of your device to ensure it is working properly. You will routinely see your Electrophysiologist annually (more often if necessary).   You should receive your ID card for your new device in 4-8 weeks. Keep this card with you at all times once received. Consider wearing a medical alert bracelet or necklace.  Your Pacemaker may be MRI compatible. This will be discussed at your next office visit/wound check.  You should avoid contact with strong electric or magnetic fields.   Do not use amateur (ham) radio equipment or electric (arc) welding torches. MP3 player headphones with magnets should not be used. Some devices are safe to use if held at least 12 inches (30 cm) from your Pacemaker. These include power tools, lawn mowers, and speakers. If you are unsure if something is safe to use, ask your health care  provider.  When using your cell phone, hold it to the ear that is on the opposite side from the Pacemaker. Do not leave your cell phone in a pocket over the Pacemaker.  You may safely use electric blankets, heating pads, computers, and microwave ovens.  Call the office right away if: You have chest pain. You feel more short of breath than you have felt before. You feel more light-headed than you have felt before. Your incision starts to open up.  This information is not intended to replace advice given to you by your health care  provider. Make sure you discuss any questions you have with your health care provider.

## 2024-06-03 ENCOUNTER — Other Ambulatory Visit: Payer: Self-pay | Admitting: Internal Medicine

## 2024-06-03 ENCOUNTER — Ambulatory Visit: Attending: Cardiology

## 2024-06-03 DIAGNOSIS — F5104 Psychophysiologic insomnia: Secondary | ICD-10-CM

## 2024-06-03 DIAGNOSIS — R001 Bradycardia, unspecified: Secondary | ICD-10-CM

## 2024-06-03 DIAGNOSIS — E039 Hypothyroidism, unspecified: Secondary | ICD-10-CM

## 2024-06-03 NOTE — Progress Notes (Signed)
 Normal dual chamber pacemaker wound check. Presenting rhythm: AS/VP 97 . Wound well healed. Routine testing performed. Thresholds, sensing, and impedances consistent with implant measurements and at 3.5V safety margin/auto capture until 3 month visit. No episodes. Reviewed arm restrictions to continue for 6 weeks total post op.  Pt enrolled in remote follow-up.

## 2024-06-03 NOTE — Patient Instructions (Signed)

## 2024-06-04 ENCOUNTER — Other Ambulatory Visit: Payer: Self-pay | Admitting: Internal Medicine

## 2024-06-04 DIAGNOSIS — E039 Hypothyroidism, unspecified: Secondary | ICD-10-CM

## 2024-06-11 ENCOUNTER — Encounter: Payer: Self-pay | Admitting: Internal Medicine

## 2024-06-11 ENCOUNTER — Ambulatory Visit (INDEPENDENT_AMBULATORY_CARE_PROVIDER_SITE_OTHER): Admitting: Internal Medicine

## 2024-06-11 VITALS — BP 128/86 | HR 77 | Temp 98.5°F | Resp 16 | Ht 66.0 in | Wt 186.2 lb

## 2024-06-11 DIAGNOSIS — E039 Hypothyroidism, unspecified: Secondary | ICD-10-CM

## 2024-06-11 DIAGNOSIS — I4892 Unspecified atrial flutter: Secondary | ICD-10-CM

## 2024-06-11 MED ORDER — LEVOTHYROXINE SODIUM 50 MCG PO TABS: 50.0000 ug | ORAL_TABLET | Freq: Every day | ORAL | 1 refills | Status: DC

## 2024-06-11 NOTE — Progress Notes (Signed)
 Subjective:  Patient ID: [name], female    DOB: [birth year]  Age: 83 y.o. MRN: [id number]  CC: Hypothyroidism   HPI [name] presents for f/up ----  Discussed the use of AI scribe software for clinical note transcription with the patient, who gave verbal consent to proceed.  History of Present Illness   [name] is an 83 year old female who presents with severe fatigue.  She experienced severe fatigue, described as sudden and severe, without accompanying symptoms such as shortness of breath, chest pain, dizziness, or lightheadedness. She felt fine driving herself to the appointment and did not experience any episodes of syncope. She was found to HB.  During her previous evaluation, an EKG revealed a low heart rate, leading to the placement of a pacemaker. Since the pacemaker placement, she has been feeling much better and reports no further episodes of fatigue.  No swelling in her legs or feet. Her thyroid  levels were checked and found to be normal. She is currently on thyroid  medication, which she has refilled recently and has some left over from a previous prescription.       Outpatient Medications Prior to Visit  Medication Sig Dispense Refill   acetaminophen  (TYLENOL ) 325 MG tablet Take 1-2 tablets (325-650 mg total) by mouth every 4 (four) hours as needed for mild pain (pain score 1-3).     Calcium  Carbonate-Vit D-Min (CALCIUM  1200 PO) Take 1 tablet by mouth at bedtime.     carvedilol  (COREG ) 6.25 MG tablet Take 1 tablet (6.25 mg total) by mouth 2 (two) times daily with a meal. 60 tablet 6   flecainide  (TAMBOCOR ) 50 MG tablet Take 0.5 tablets (25 mg total) by mouth 2 (two) times daily. 90 tablet 3   magnesium  gluconate (MAGONATE) 500 MG tablet Take 500 mg by mouth daily.     Melatonin 10 MG TABS Take 1 tablet by mouth.     Potassium 99 MG TABS Take 1 tablet by mouth at bedtime.     rivaroxaban  (XARELTO ) 20 MG TABS tablet Take 1 tablet (20  mg total) by mouth daily with supper. 90 tablet 3   rosuvastatin  (CRESTOR ) 10 MG tablet Take 1 tablet (10 mg total) by mouth daily. 90 tablet 3   zolpidem  (AMBIEN ) 10 MG tablet Take 1 tablet (10 mg total) by mouth at bedtime as needed. for sleep 30 tablet 0   levothyroxine  (SYNTHROID ) 50 MCG tablet TAKE ONE TABLET BY MOUTH DAILY BEFORE BREAKFAST 90 tablet 0   No facility-administered medications prior to visit.    ROS Review of Systems  Constitutional:  Negative for appetite change, chills, diaphoresis, fatigue and fever.  HENT: Negative.    Eyes: Negative.   Respiratory:  Negative for cough, chest tightness, shortness of breath and wheezing.   Cardiovascular:  Negative for chest pain, palpitations and leg swelling.  Gastrointestinal:  Negative for abdominal pain, constipation, diarrhea, nausea and vomiting.  Genitourinary: Negative.  Negative for difficulty urinating.  Musculoskeletal: Negative.  Negative for arthralgias and myalgias.  Skin: Negative.   Neurological:  Negative for dizziness and weakness.  Hematological:  Negative for adenopathy.  Psychiatric/Behavioral: Negative.      Objective:  BP 128/86 (BP Location: Left Arm, Patient Position: Sitting, Cuff Size: Normal)   Pulse 77   Temp 98.5 F (36.9 C) (Oral)   Resp 16   Ht 5' 6 (1.676 m)   Wt 186 lb 3.2 oz (84.5 kg)   SpO2 98%   BMI 30.05  kg/m   BP Readings from Last 3 Encounters:  06/11/24 128/86  05/16/24 (!) 98/55  05/13/24 (!) 166/80    Wt Readings from Last 3 Encounters:  06/11/24 186 lb 3.2 oz (84.5 kg)  05/16/24 190 lb 0.6 oz (86.2 kg)  05/13/24 194 lb 6.4 oz (88.2 kg)    Physical Exam Vitals reviewed.  Constitutional:      Appearance: Normal appearance.  HENT:     Mouth/Throat:     Mouth: Mucous membranes are moist.   Eyes:     General: No scleral icterus.    Conjunctiva/sclera: Conjunctivae normal.    Cardiovascular:     Rate and Rhythm: Normal rate and regular rhythm.     Heart  sounds: No murmur heard.    No friction rub. No gallop.  Pulmonary:     Effort: Pulmonary effort is normal.     Breath sounds: No stridor. No wheezing, rhonchi or rales.  Abdominal:     General: Abdomen is flat.     Palpations: There is no mass.     Tenderness: There is no abdominal tenderness. There is no guarding.     Hernia: No hernia is present.   Musculoskeletal:        General: Normal range of motion.     Cervical back: Neck supple.     Right lower leg: No edema.     Left lower leg: No edema.  Lymphadenopathy:     Cervical: No cervical adenopathy.   Skin:    General: Skin is warm and dry.   Neurological:     General: No focal deficit present.     Mental Status: She is alert. Mental status is at baseline.   Psychiatric:        Mood and Affect: Mood normal.        Behavior: Behavior normal.     Lab Results  Component Value Date   WBC 8.5 05/16/2024   HGB 12.2 05/16/2024   HCT 37.8 05/16/2024   PLT 216 05/16/2024   GLUCOSE 124 (H) 05/16/2024   CHOL 159 11/29/2023   TRIG 84.0 11/29/2023   HDL 55.00 11/29/2023   LDLDIRECT 127.1 07/08/2013   LDLCALC 88 11/29/2023   ALT 12 11/29/2023   AST 19 11/29/2023   NA 137 05/16/2024   K 3.8 05/16/2024   CL 106 05/16/2024   CREATININE 0.91 05/16/2024   BUN 15 05/16/2024   CO2 23 05/16/2024   TSH 2.818 05/13/2024   INR 1.4 (H) 07/03/2019   HGBA1C 5.5 03/13/2008   MICROALBUR 0.8 12/03/2006    DG Chest Port 1 View Result Date: 05/13/2024 CLINICAL DATA:  Weakness and bradycardia. EXAM: PORTABLE CHEST 1 VIEW COMPARISON:  Chest radiograph dated 12/03/2018 FINDINGS: No focal consolidation, pleural effusion or pneumothorax. Stable cardiac silhouette. No acute osseous pathology. IMPRESSION: No active disease. Electronically Signed   By: Angus Bark M.D.   On: 05/13/2024 15:02    Assessment & Plan:  Atrial flutter, paroxysmal (HCC)- She has good rate and rhythm control.  Acquired hypothyroidism- She is euthyroid -      Levothyroxine  Sodium; Take 1 tablet (50 mcg total) by mouth daily before breakfast.  Dispense: 90 tablet; Refill: 1     Follow-up: No follow-ups on file.  Monica Crouch, MD

## 2024-06-16 ENCOUNTER — Ambulatory Visit (HOSPITAL_BASED_OUTPATIENT_CLINIC_OR_DEPARTMENT_OTHER)
Admission: RE | Admit: 2024-06-16 | Discharge: 2024-06-16 | Disposition: A | Discharge status: Home / Self Care | Source: Ambulatory Visit | Attending: Internal Medicine | Admitting: Internal Medicine

## 2024-06-16 ENCOUNTER — Other Ambulatory Visit: Payer: Medicare Other

## 2024-06-16 ENCOUNTER — Ambulatory Visit: Payer: Self-pay | Admitting: Internal Medicine

## 2024-06-16 DIAGNOSIS — Z0000 Encounter for general adult medical examination without abnormal findings: Secondary | ICD-10-CM | POA: Diagnosis not present

## 2024-06-16 DIAGNOSIS — Z780 Asymptomatic menopausal state: Secondary | ICD-10-CM | POA: Diagnosis not present

## 2024-06-19 ENCOUNTER — Other Ambulatory Visit (HOSPITAL_COMMUNITY): Payer: Self-pay | Admitting: Adult Health

## 2024-06-19 DIAGNOSIS — I4819 Other persistent atrial fibrillation: Secondary | ICD-10-CM

## 2024-06-23 ENCOUNTER — Telehealth (HOSPITAL_COMMUNITY): Payer: Self-pay | Admitting: Internal Medicine

## 2024-06-23 NOTE — Telephone Encounter (Signed)
 Prescription refill request for Xarelto  received.  Indication: Afib  Last office visit: 05/13/24 (CHF)  Weight: 84.5kg Age: 83 Scr: 0.91 (05/16/24)  CrCl: 61.55ml/min  Appropriate dose. Refill sent.

## 2024-06-23 NOTE — Telephone Encounter (Signed)
 front office called pt and left pt a voice message to schedule a f/u appt for refills (medications) - - per Geofm Christmas, RN pt needs an appt for refills. Instructed pt to call back to get scheduled.

## 2024-06-26 ENCOUNTER — Ambulatory Visit

## 2024-06-26 DIAGNOSIS — I441 Atrioventricular block, second degree: Secondary | ICD-10-CM | POA: Diagnosis not present

## 2024-06-26 LAB — CUP PACEART REMOTE DEVICE CHECK
Battery Remaining Longevity: 150 mo
Battery Voltage: 3.21 V
Brady Statistic AP VP Percent: 24.8 %
Brady Statistic AP VS Percent: 0.01 %
Brady Statistic AS VP Percent: 67.97 %
Brady Statistic AS VS Percent: 7.22 %
Brady Statistic RA Percent Paced: 27.65 %
Brady Statistic RV Percent Paced: 92.77 %
Date Time Interrogation Session: 20250703062242
Implantable Lead Connection Status: 753985
Implantable Lead Connection Status: 753985
Implantable Lead Implant Date: 20250522
Implantable Lead Implant Date: 20250522
Implantable Lead Location: 753859
Implantable Lead Location: 753860
Implantable Lead Model: 3830
Implantable Lead Model: 5076
Implantable Pulse Generator Implant Date: 20250522
Lead Channel Impedance Value: 304 Ohm
Lead Channel Impedance Value: 380 Ohm
Lead Channel Impedance Value: 380 Ohm
Lead Channel Impedance Value: 513 Ohm
Lead Channel Pacing Threshold Amplitude: 0.625 V
Lead Channel Pacing Threshold Amplitude: 1 V
Lead Channel Pacing Threshold Pulse Width: 0.4 ms
Lead Channel Pacing Threshold Pulse Width: 0.4 ms
Lead Channel Sensing Intrinsic Amplitude: 3 mV
Lead Channel Sensing Intrinsic Amplitude: 3 mV
Lead Channel Sensing Intrinsic Amplitude: 8.25 mV
Lead Channel Setting Pacing Amplitude: 2 V
Lead Channel Setting Pacing Amplitude: 2 V
Lead Channel Setting Pacing Pulse Width: 0.4 ms
Lead Channel Setting Sensing Sensitivity: 1.2 mV
Zone Setting Status: 755011

## 2024-06-28 ENCOUNTER — Ambulatory Visit: Payer: Self-pay | Admitting: Cardiology

## 2024-07-01 ENCOUNTER — Other Ambulatory Visit: Payer: Self-pay | Admitting: Internal Medicine

## 2024-07-01 DIAGNOSIS — Z1231 Encounter for screening mammogram for malignant neoplasm of breast: Secondary | ICD-10-CM

## 2024-07-01 NOTE — Progress Notes (Incomplete)
 Patient ID: [name], female   DOB: [birth year], 83 y.o.   MRN: [id number]    Advanced Heart Failure Clinic Note    PCP: Joshua Debby CROME, MD EP:  Dr. Nancey HF Cardiologist: Dr Cherrie   Reason for Visit: F/u for Afib   HPI: [name] is an  83 y.o. woman with h/o of obesity, PAF and AFL, s/p AFL ablation.   She was admitted in 11/13 with acute bilateral PE, rapid AF and heart failure after a long plane trip. EF 40-45%.  On 10/28/12 Unsuccessful DCCV.  Loaded amio and underwent repeat DC-CV on 11/13. Maintained on Xarelto . In 11/13 had AFL.  Amio increased to 400 bid. On 12/10/12 successful DC-CV.  Amiodarone  was eventually stopped.   Echo 4/14 EF 65%, mild LVH, normal RV size and systolic function, PA systolic pressure 35 mmHg. Had recurrent AF in 9/15. Amiodarone  was restarted and she was cardioverted to NSR on 09/14/14. Seen again on Oct 16. Asymptomatic. Was back in AFL with RVR at 150. Underwent DC-CV but reverted to AFL. S/p A flutter ablation 12/15/2014. Was seen by Dr Kelsie in follow up 01/08/15 and was stable at that time with no symptoms. She was in NSR at that time. Metoprolol  stopped.   Admitted 10/31/16-11/02/16 with SOB, AFib. Started on flecainide .   Echo 12/19 EF 55-60%   In 9/20 we saw her for unexplained dyspnea. REDS 31% Zio patch ok (no AF). Cardiac CT mild plaque prox LAD otherwise normal.    Echo 3/25 showed EF 55-60%, normal RV, moderate TR, moderately dilated LA  5/25 admitted w/ symptomatic bradycardia, high grade AVB. Bradycardia persisted despite ? blocker washout. She required dual chamber pacemaker implant by Dr. Kennyth.   She presents today for f/u. In need of med refills. Reports feeling much better since getting PPM. Fatigue resolved. Back to being more active around her home. Denies dizziness, no chest pain, dyspnea, LEE. No symptoms of breakthrough Afib. Device interrogation shows 0% afib burden. Denies abnormal bleeding w/ Xarelto . BP well  controlled.    ROS: All systems negative except as listed in HPI, PMH and Problem List.  Past Medical History:  Diagnosis Date   1st degree AV block    Atrial flutter (HCC)    a. s/p CTI ablation 11/2014   Cataract    Diverticulosis 2002   Dr. Aneita   DVT (deep venous thrombosis) (HCC) 2013   S/P a long airplane ride   Endometrial polyp    Fasting hyperglycemia    A1C  within normal limits   Hyperlipidemia    Hypothyroidism    LBBB (left bundle branch block)    LVH (left ventricular hypertrophy) 2014   Mild, Noted on ECHO   Obese    Pancreatitis due to common bile duct stone    Persistent atrial fibrillation (HCC)    RVR   Personal history of colonic polyps 2002   tubular adenoma   PMB (postmenopausal bleeding)    Pulmonary embolism (HCC) 2013   S/P a long airplane ride   Sinus bradycardia    Tortuous colon    Vitamin D  deficiency    Wears glasses     Current Outpatient Medications  Medication Sig Dispense Refill   acetaminophen  (TYLENOL ) 325 MG tablet Take 1-2 tablets (325-650 mg total) by mouth every 4 (four) hours as needed for mild pain (pain score 1-3).     Calcium  Carbonate-Vit D-Min (CALCIUM  1200 PO) Take 1 tablet by mouth at bedtime.  levothyroxine  (SYNTHROID ) 50 MCG tablet Take 1 tablet (50 mcg total) by mouth daily before breakfast. 90 tablet 1   magnesium  gluconate (MAGONATE) 500 MG tablet Take 500 mg by mouth daily.     Melatonin 10 MG TABS Take 1 tablet by mouth.     Potassium 99 MG TABS Take 1 tablet by mouth at bedtime.     zolpidem  (AMBIEN ) 10 MG tablet Take 1 tablet (10 mg total) by mouth at bedtime as needed. for sleep 30 tablet 0   carvedilol  (COREG ) 6.25 MG tablet Take 1 tablet (6.25 mg total) by mouth 2 (two) times daily with a meal. 180 tablet 3   flecainide  (TAMBOCOR ) 50 MG tablet Take 0.5 tablets (25 mg total) by mouth 2 (two) times daily. 90 tablet 3   rivaroxaban  (XARELTO ) 20 MG TABS tablet Take 1 tablet (20 mg total) by mouth daily  with supper. 100 tablet 3   rosuvastatin  (CRESTOR ) 10 MG tablet Take 1 tablet (10 mg total) by mouth daily. 90 tablet 3   No current facility-administered medications for this encounter.   Wt Readings from Last 3 Encounters:  07/03/24 83.5 kg (184 lb)  06/11/24 84.5 kg (186 lb 3.2 oz)  05/16/24 86.2 kg (190 lb 0.6 oz)   BP 136/78   Pulse 71   Ht 5' 6 (1.676 m)   Wt 83.5 kg (184 lb)   SpO2 96%   BMI 29.70 kg/m   PHYSICAL EXAM: General:  Well appearing. No respiratory difficulty HEENT: normal Neck: supple. no JVD. Carotids 2+ bilat; no bruits. No lymphadenopathy or thyromegaly appreciated. Cor: PMI nondisplaced. Regular rate & rhythm. No rubs, gallops or murmurs. Lungs: clear Abdomen: soft, nontender, nondistended. No hepatosplenomegaly. No bruits or masses. Good bowel sounds. Extremities: no cyanosis, clubbing, rash, edema Neuro: alert & oriented x 3, cranial nerves grossly intact. moves all 4 extremities w/o difficulty. Affect pleasant.  ECG (personally reviewed): not performed PPM Interrogation (personally reviewed), 95% V pacing, No AT/AF, longevity 12.6 yrs   ASSESSMENT & PLAN:  1.  Paroxysmal Afib RVR:  - Has had history of recurrent AFL despite DC-CV (x 2 in 2013 and x 1 in 2015), - s/p AFL ablation by Dr Kelsie. - Recurrent AF in 11/17 - on Flecainide  + coreg  for Afib rhythm control   - 0% AF/AF burden on PPM interrogation  - on Xarelto  for a/c. Denies abnormal bleeding    2. Symptomatic Second Degree, Type II AVB  - s/p PPM 05/15/24 - followed by Dr. Kennyth    3. HLD - on Crestor  10 mg daily. Lipids good 12/24 - check HFTs for medication monitoring   F/u in EP clinic as directed. F/u here in 1 year w/ Dr. Cherrie. Sooner if needed     Caffie Shed PA-C  9:28 AM 07/03/2024

## 2024-07-03 ENCOUNTER — Encounter (HOSPITAL_COMMUNITY): Payer: Self-pay

## 2024-07-03 ENCOUNTER — Ambulatory Visit (HOSPITAL_COMMUNITY)
Admission: RE | Admit: 2024-07-03 | Discharge: 2024-07-03 | Disposition: A | Discharge status: Home / Self Care | Source: Ambulatory Visit | Attending: Cardiology | Admitting: Cardiology

## 2024-07-03 DIAGNOSIS — E669 Obesity, unspecified: Secondary | ICD-10-CM | POA: Insufficient documentation

## 2024-07-03 DIAGNOSIS — Z79899 Other long term (current) drug therapy: Secondary | ICD-10-CM | POA: Diagnosis not present

## 2024-07-03 DIAGNOSIS — Z86711 Personal history of pulmonary embolism: Secondary | ICD-10-CM | POA: Insufficient documentation

## 2024-07-03 DIAGNOSIS — Z7901 Long term (current) use of anticoagulants: Secondary | ICD-10-CM | POA: Diagnosis not present

## 2024-07-03 DIAGNOSIS — I480 Paroxysmal atrial fibrillation: Secondary | ICD-10-CM | POA: Diagnosis not present

## 2024-07-03 DIAGNOSIS — Z6829 Body mass index (BMI) 29.0-29.9, adult: Secondary | ICD-10-CM | POA: Insufficient documentation

## 2024-07-03 DIAGNOSIS — I2510 Atherosclerotic heart disease of native coronary artery without angina pectoris: Secondary | ICD-10-CM | POA: Diagnosis not present

## 2024-07-03 DIAGNOSIS — E785 Hyperlipidemia, unspecified: Secondary | ICD-10-CM | POA: Insufficient documentation

## 2024-07-03 MED ORDER — ROSUVASTATIN CALCIUM 10 MG PO TABS: 10.0000 mg | ORAL_TABLET | Freq: Every day | ORAL | 3 refills | Status: AC

## 2024-07-03 MED ORDER — RIVAROXABAN 20 MG PO TABS: 20.0000 mg | ORAL_TABLET | Freq: Every day | ORAL | 3 refills | Status: AC

## 2024-07-03 MED ORDER — CARVEDILOL 6.25 MG PO TABS: 6.2500 mg | ORAL_TABLET | Freq: Two times a day (BID) | ORAL | 3 refills | Status: AC

## 2024-07-03 MED ORDER — FLECAINIDE ACETATE 50 MG PO TABS
25.0000 mg | ORAL_TABLET | Freq: Two times a day (BID) | ORAL | 3 refills | Status: AC
Start: 2024-07-03 — End: ?

## 2024-07-03 NOTE — Addendum Note (Signed)
 Encounter addended by: Marcine Caffie HERO, PA-C on: 07/03/2024 1:40 PM  Actions taken: Clinical Note Signed

## 2024-07-03 NOTE — Patient Instructions (Signed)
 There has been no changes to your medications.  Please ask your PCP to draw a BMET ( Basic Metabolic Panel).  Your physician recommends that you schedule a follow-up appointment in: 1 year ( July 2026) ** PLEASE CALL THE OFFICE IN APRIL 2026 TO ARRANGE YOUR FOLLOW UP APPOINTMENT.**  If you have any questions or concerns before your next appointment please send us  a message through [place] or call our office at [phone].    TO LEAVE A MESSAGE FOR THE NURSE SELECT OPTION 2, PLEASE LEAVE A MESSAGE INCLUDING: YOUR NAME DATE OF BIRTH CALL BACK NUMBER REASON FOR CALL**this is important as we prioritize the call backs  YOU WILL RECEIVE A CALL BACK THE SAME DAY AS LONG AS YOU CALL BEFORE 4:00 PM  At the [facility], you and your health needs are our priority. As part of our continuing mission to provide you with exceptional heart care, we have created designated Provider Care Teams. These Care Teams include your primary Cardiologist (physician) and Advanced Practice Providers (APPs- Physician Assistants and Nurse Practitioners) who all work together to provide you with the care you need, when you need it.   You may see any of the following providers on your designated Care Team at your next follow up: Dr Toribio Fuel Dr Ezra Shuck Dr. Ria Commander Dr. Morene Brownie Amy Lenetta, NP Caffie Shed, [place] [facility], [place] Beckey Coe, NP [place] Lee, NP Ellouise Class, NP Tinnie Redman, PharmD Jaun Bash, PharmD   Please be sure to bring in all your medications bottles to every appointment.    Thank you for choosing [facility]

## 2024-07-10 ENCOUNTER — Encounter: Payer: Self-pay | Admitting: Cardiology

## 2024-07-14 ENCOUNTER — Telehealth: Payer: Self-pay

## 2024-07-14 NOTE — Telephone Encounter (Signed)
 Alert remote transmission:  AT/AF Daily Burden > Threshold AF in progress from 7/20 @ 00:51, not always good rate control, burden 0.3%, Xarelto  per EPIC -  LM on patient's VM to assess symptoms given presenting shows ongoing flutter with irregular, elevated Ventricular rates.

## 2024-07-15 NOTE — Telephone Encounter (Signed)
 The patient called back to the office. Advised the patient that an alert transmission was received as stated below.  Per the patient, she had been outside on Saturday and could tell she was getting too hot otherwise, she feels great and not experiencing any symptoms at this time. She has a watch that records her HR's and ever only feels symptoms when her heart rates are too fast.   Confirmed compliance with Xarelto  20 mg once daily, flecainide  25 mg BID, and coreg  6.25 mg BID.  Advised the patient to maintain good hydration and she voices understanding and is agreeable. Ms. Morace has scheduled follow up on 08/15/24 with Daphne Barrack, NP.   The patient was very appreciative of the follow up call.

## 2024-07-17 ENCOUNTER — Ambulatory Visit
Admission: RE | Admit: 2024-07-17 | Discharge: 2024-07-17 | Disposition: A | Discharge status: Home / Self Care | Source: Ambulatory Visit | Attending: Internal Medicine | Admitting: Internal Medicine

## 2024-07-17 DIAGNOSIS — Z1231 Encounter for screening mammogram for malignant neoplasm of breast: Secondary | ICD-10-CM

## 2024-07-23 ENCOUNTER — Other Ambulatory Visit: Payer: Self-pay | Admitting: Internal Medicine

## 2024-07-23 DIAGNOSIS — F5104 Psychophysiologic insomnia: Secondary | ICD-10-CM

## 2024-08-12 DIAGNOSIS — R3121 Asymptomatic microscopic hematuria: Secondary | ICD-10-CM | POA: Diagnosis not present

## 2024-08-13 NOTE — Progress Notes (Unsigned)
  Electrophysiology Office Note:   Date:  08/15/2024  ID:  Monica, Beck 20-Jul-1941, MRN [id number]  Primary Cardiologist: None Primary Heart Failure: None Electrophysiologist: Fonda Kitty, MD       History of Present Illness:   [name] is a 83 y.o. female, retired [employer], with h/o symptomatic bradycardia / 2nd Degree AVB s/p PPM seen today for routine electrophysiology follow-up s/p Pacemaker implant.  Since last being seen in our clinic the patient reports she has been doing well. She is exercising and actively trying to lose weight.  No concerns with device post implant / no device related concerns.   She denies chest pain, palpitations, dyspnea, PND, orthopnea, nausea, vomiting, dizziness, syncope, edema, weight gain, or early satiety.    Review of systems complete and found to be negative unless listed in HPI.    EP Information / Studies Reviewed:    EKG is ordered today. Personal review as below.      PPM Interrogation-  reviewed in detail today,  See PACEART report.  Device History: Medtronic Dual Chamber PPM implanted 05/15/24 for Symptomatic bradycardia  Risk Assessment/Calculations:    CHA2DS2-VASc Score = 3   This indicates a 3.2% annual risk of stroke. The patient's score is based upon: CHF History: 0 HTN History: 0 Diabetes History: 0 Stroke History: 0 Vascular Disease History: 0 Age Score: 2 Gender Score: 1             Physical Exam:   VS:  BP 138/70 (BP Location: Right Arm, Patient Position: Sitting, Cuff Size: Large)   Pulse 63   Ht 5' 6 (1.676 m)   Wt 182 lb (82.6 kg)   SpO2 98%   BMI 29.38 kg/m    Wt Readings from Last 3 Encounters:  08/15/24 182 lb (82.6 kg)  07/03/24 184 lb (83.5 kg)  06/11/24 186 lb 3.2 oz (84.5 kg)     GEN: Well nourished, well developed in no acute distress NECK: No JVD; No carotid bruits CARDIAC: Regular rate and rhythm, no murmurs, rubs, gallops RESPIRATORY:  Clear to auscultation  without rales, wheezing or rhonchi  ABDOMEN: Soft, non-tender, non-distended EXTREMITIES:  No edema; No deformity   ASSESSMENT AND PLAN:    Symptomatic bradycardia / 2nd Degree AVB s/p Medtronic PPM  -Normal PPM function -See Pace Art report -No changes today  Persistent Atrial Fibrillation  Atrial Flutter High Risk Medication Monitoring: Flecainide   CHA2DS2-VASc 3  -continue OAC for stroke prophylaxis  -continue coreg  6.25mg  BID  -flecainide  25 mg BID  -EKG with NSR, stable intervals     Secondary Hypercoagulable State  -continue Xaretlo, dose reviewed and appropriate by Cr Cl 61 mL/min    Disposition:   Follow up with Dr. Kitty / EP APP in 12 months  Signed, Daphne Barrack, NP-C, AGACNP-BC [facility] - Electrophysiology  08/15/2024, 2:08 PM

## 2024-08-15 ENCOUNTER — Encounter: Payer: Self-pay | Admitting: Pulmonary Disease

## 2024-08-15 ENCOUNTER — Ambulatory Visit: Attending: Pulmonary Disease | Admitting: Pulmonary Disease

## 2024-08-15 VITALS — BP 138/70 | HR 63 | Ht 66.0 in | Wt 182.0 lb

## 2024-08-15 DIAGNOSIS — D6869 Other thrombophilia: Secondary | ICD-10-CM | POA: Diagnosis not present

## 2024-08-15 DIAGNOSIS — I4819 Other persistent atrial fibrillation: Secondary | ICD-10-CM

## 2024-08-15 DIAGNOSIS — R001 Bradycardia, unspecified: Secondary | ICD-10-CM | POA: Diagnosis not present

## 2024-08-15 DIAGNOSIS — I441 Atrioventricular block, second degree: Secondary | ICD-10-CM

## 2024-08-15 DIAGNOSIS — Z950 Presence of cardiac pacemaker: Secondary | ICD-10-CM

## 2024-08-15 LAB — CUP PACEART INCLINIC DEVICE CHECK
Date Time Interrogation Session: 20250822172039
Implantable Lead Connection Status: 753985
Implantable Lead Connection Status: 753985
Implantable Lead Implant Date: 20250522
Implantable Lead Implant Date: 20250522
Implantable Lead Location: 753859
Implantable Lead Location: 753860
Implantable Lead Model: 3830
Implantable Lead Model: 5076
Implantable Pulse Generator Implant Date: 20250522

## 2024-08-15 NOTE — Patient Instructions (Signed)
 Medication Instructions:  Your physician recommends that you continue on your current medications as directed. Please refer to the Current Medication list given to you today.  *If you need a refill on your cardiac medications before your next appointment, please call your pharmacy*  Lab Work: None ordered If you have labs (blood work) drawn today and your tests are completely normal, you will receive your results only by: MyChart Message (if you have MyChart) OR A paper copy in the mail If you have any lab test that is abnormal or we need to change your treatment, we will call you to review the results.  Follow-Up: At [facility], you and your health needs are our priority.  As part of our continuing mission to provide you with exceptional heart care, our providers are all part of one team.  This team includes your primary Cardiologist (physician) and Advanced Practice Providers or APPs (Physician Assistants and Nurse Practitioners) who all work together to provide you with the care you need, when you need it.  Your next appointment:   6 month(s)  Provider:   Fonda Kitty, MD or Daphne Barrack, NP

## 2024-08-17 ENCOUNTER — Ambulatory Visit: Payer: Self-pay | Admitting: Cardiology

## 2024-08-19 ENCOUNTER — Other Ambulatory Visit: Payer: Self-pay | Admitting: Urology

## 2024-08-19 DIAGNOSIS — R3129 Other microscopic hematuria: Secondary | ICD-10-CM

## 2024-09-11 ENCOUNTER — Ambulatory Visit (INDEPENDENT_AMBULATORY_CARE_PROVIDER_SITE_OTHER): Payer: Medicare Other

## 2024-09-11 VITALS — BP 110/78 | HR 66 | Ht 65.0 in | Wt 179.8 lb

## 2024-09-11 DIAGNOSIS — Z0000 Encounter for general adult medical examination without abnormal findings: Secondary | ICD-10-CM
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
## 2024-09-11 NOTE — Patient Instructions (Addendum)
 [name],  Thank you for taking the time for your Medicare Wellness Visit. I appreciate your continued commitment to your health goals. Please review the care plan we discussed, and feel free to reach out if I can assist you further.  Medicare recommends these wellness visits once per year to help you and your care team stay ahead of potential health issues. These visits are designed to focus on prevention, allowing your provider to concentrate on managing your acute and chronic conditions during your regular appointments.  Please note that Annual Wellness Visits do not include a physical exam. Some assessments may be limited, especially if the visit was conducted virtually. If needed, we may recommend a separate in-person follow-up with your provider.  Ongoing Care Seeing your primary care provider every 3 to 6 months helps us  monitor your health and provide consistent, personalized care. Next office visit on 12/02/2024.  You are due for a Flu vaccine.  Keep up the good work.  Referrals If a referral was made during today's visit and you haven't received any updates within two weeks, please contact the referred provider directly to check on the status.  Recommended Screenings:  Health Maintenance  Topic Date Due   Flu Shot  07/25/2024   COVID-19 Vaccine (4 - Mixed Product risk 2024-25 season) 08/25/2024   Medicare Annual Wellness Visit  09/11/2025   DTaP/Tdap/Td vaccine (3 - Td or Tdap) 07/02/2029   Pneumococcal Vaccine for age over 28  Completed   Zoster (Shingles) Vaccine  Completed   HPV Vaccine  Aged Out   Meningitis B Vaccine  Aged Out       05/13/2024   11:55 AM  Advanced Directives  Does Patient Have a Medical Advance Directive? No   Advance Care Planning is important because it: Ensures you receive medical care that aligns with your values, goals, and preferences. Provides guidance to your family and loved ones, reducing the emotional burden of decision-making during  critical moments.  Vision: Annual vision screenings are recommended for early detection of glaucoma, cataracts, and diabetic retinopathy. These exams can also reveal signs of chronic conditions such as diabetes and high blood pressure.  Dental: Annual dental screenings help detect early signs of oral cancer, gum disease, and other conditions linked to overall health, including heart disease and diabetes.  Please see the attached documents for additional preventive care recommendations.

## 2024-09-15 ENCOUNTER — Other Ambulatory Visit (HOSPITAL_COMMUNITY): Payer: Self-pay | Admitting: Internal Medicine

## 2024-09-15 DIAGNOSIS — I480 Paroxysmal atrial fibrillation: Secondary | ICD-10-CM

## 2024-09-25 ENCOUNTER — Ambulatory Visit

## 2024-09-25 ENCOUNTER — Telehealth (HOSPITAL_COMMUNITY): Payer: Self-pay | Admitting: Cardiology

## 2024-09-25 DIAGNOSIS — I480 Paroxysmal atrial fibrillation: Secondary | ICD-10-CM

## 2024-09-25 NOTE — Telephone Encounter (Signed)
 Returned patients call to advise we recommend taking the remote monitor if she is gone more than 7 days. Pt voiced understanding and was appreciative of call.

## 2024-09-25 NOTE — Telephone Encounter (Signed)
 Patient left VM reporting she has upcoming travel plans x 2 weeks, would like to know if she has to travel with her transmitter ?for pacemaker?   Will route to device clinic for recommendations

## 2024-09-26 LAB — CUP PACEART REMOTE DEVICE CHECK
Battery Remaining Longevity: 148 mo
Battery Voltage: 3.18 V
Brady Statistic AP VP Percent: 18.06 %
Brady Statistic AP VS Percent: 0.01 %
Brady Statistic AS VP Percent: 81.36 %
Brady Statistic AS VS Percent: 0.57 %
Brady Statistic RA Percent Paced: 18.22 %
Brady Statistic RV Percent Paced: 99.42 %
Date Time Interrogation Session: 20251002015628
Implantable Lead Connection Status: 753985
Implantable Lead Connection Status: 753985
Implantable Lead Implant Date: 20250522
Implantable Lead Implant Date: 20250522
Implantable Lead Location: 753859
Implantable Lead Location: 753860
Implantable Lead Model: 3830
Implantable Lead Model: 5076
Implantable Pulse Generator Implant Date: 20250522
Lead Channel Impedance Value: 323 Ohm
Lead Channel Impedance Value: 361 Ohm
Lead Channel Impedance Value: 380 Ohm
Lead Channel Impedance Value: 475 Ohm
Lead Channel Pacing Threshold Amplitude: 0.625 V
Lead Channel Pacing Threshold Amplitude: 0.875 V
Lead Channel Pacing Threshold Pulse Width: 0.4 ms
Lead Channel Pacing Threshold Pulse Width: 0.4 ms
Lead Channel Sensing Intrinsic Amplitude: 2.5 mV
Lead Channel Sensing Intrinsic Amplitude: 2.5 mV
Lead Channel Sensing Intrinsic Amplitude: 6.375 mV
Lead Channel Sensing Intrinsic Amplitude: 6.375 mV
Lead Channel Setting Pacing Amplitude: 1.5 V
Lead Channel Setting Pacing Amplitude: 2 V
Lead Channel Setting Pacing Pulse Width: 0.4 ms
Lead Channel Setting Sensing Sensitivity: 1.2 mV
Zone Setting Status: 755011

## 2024-09-28 ENCOUNTER — Ambulatory Visit: Payer: Self-pay | Admitting: Cardiology

## 2024-09-29 NOTE — Progress Notes (Signed)
 Remote PPM Transmission

## 2024-10-01 NOTE — Progress Notes (Signed)
 Remote pacemaker transmission.

## 2024-10-02 NOTE — CV Procedure (Signed)
  Device system confirmed to be MRI conditional, with implant date > 6 weeks ago, and no evidence of abandoned or epicardial leads in review of most recent CXR  Device last cleared by EP Provider: Daphne Barrack 10/02/24  Clearance is good through for 1 year as long as parameters remain stable at time of check. If pt undergoes a cardiac device procedure during that time, they should be re-cleared.   Tachy-therapies to be programmed off if applicable with device back to pre-MRI settings after completion of exam.  [employer] - Programming recommendation received through [employer] App/Tablet  Rocky Catalan, RT  10/02/2024 5:07 PM

## 2024-10-07 ENCOUNTER — Ambulatory Visit (HOSPITAL_COMMUNITY)
Admission: RE | Admit: 2024-10-07 | Discharge: 2024-10-07 | Disposition: A | Discharge status: Home / Self Care | Source: Ambulatory Visit | Attending: Urology | Admitting: Urology

## 2024-10-07 ENCOUNTER — Other Ambulatory Visit: Payer: Self-pay | Admitting: Urology

## 2024-10-07 DIAGNOSIS — R3129 Other microscopic hematuria: Secondary | ICD-10-CM | POA: Diagnosis not present

## 2024-10-07 DIAGNOSIS — D3502 Benign neoplasm of left adrenal gland: Secondary | ICD-10-CM | POA: Diagnosis not present

## 2024-10-07 DIAGNOSIS — D3501 Benign neoplasm of right adrenal gland: Secondary | ICD-10-CM | POA: Diagnosis not present

## 2024-10-07 MED ORDER — GADOBUTROL 1 MMOL/ML IV SOLN
8.0000 mL | Freq: Once | INTRAVENOUS | Status: AC | PRN
  Administered 2024-10-07: 8 mL via INTRAVENOUS

## 2024-10-07 NOTE — Progress Notes (Signed)
 Patient was monitored by this RN during MRI scan due to presence of a pacemaker. Cardiac rhythm was continuously monitored throughout the procedure. Prior to the start of the scan, the pacemaker was placed in MRI-safe mode by the MRI technician and/or pacemaker representative. Following the completion of the scan, the device was returned to its pre-MRI settings. Neurological status and orientation post-procedure were unchanged from baseline.   Pre-procedure Heart Rate (Prior to being placed in MRI safe mode):68 MRI Mode: 100 Post-procedure Heart Rate (Once pacemaker is returned to baseline mode):  71

## 2024-10-16 DIAGNOSIS — L821 Other seborrheic keratosis: Secondary | ICD-10-CM | POA: Diagnosis not present

## 2024-12-02 ENCOUNTER — Encounter: Admitting: Internal Medicine

## 2024-12-14 ENCOUNTER — Other Ambulatory Visit (HOSPITAL_COMMUNITY): Payer: Self-pay | Admitting: Internal Medicine

## 2024-12-16 ENCOUNTER — Ambulatory Visit (HOSPITAL_COMMUNITY)
Admission: RE | Admit: 2024-12-16 | Discharge: 2024-12-16 | Disposition: A | Discharge status: Home / Self Care | Source: Ambulatory Visit | Attending: Internal Medicine | Admitting: Internal Medicine

## 2024-12-16 ENCOUNTER — Encounter (HOSPITAL_COMMUNITY): Payer: Self-pay | Admitting: Internal Medicine

## 2024-12-16 VITALS — BP 118/80 | HR 76 | Ht 65.0 in | Wt 177.0 lb

## 2024-12-16 DIAGNOSIS — I4819 Other persistent atrial fibrillation: Secondary | ICD-10-CM

## 2024-12-16 DIAGNOSIS — Z79899 Other long term (current) drug therapy: Secondary | ICD-10-CM | POA: Diagnosis not present

## 2024-12-16 DIAGNOSIS — D6869 Other thrombophilia: Secondary | ICD-10-CM

## 2024-12-16 DIAGNOSIS — Z5181 Encounter for therapeutic drug level monitoring: Secondary | ICD-10-CM
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
## 2024-12-25 ENCOUNTER — Encounter

## 2024-12-26 ENCOUNTER — Ambulatory Visit

## 2024-12-26 DIAGNOSIS — I480 Paroxysmal atrial fibrillation: Secondary | ICD-10-CM

## 2024-12-27 ENCOUNTER — Ambulatory Visit: Payer: Self-pay | Admitting: Cardiology

## 2024-12-27 LAB — CUP PACEART REMOTE DEVICE CHECK
Battery Remaining Longevity: 147 mo
Battery Voltage: 3.15 V
Brady Statistic AP VP Percent: 18.68 %
Brady Statistic AP VS Percent: 0.01 %
Brady Statistic AS VP Percent: 79.13 %
Brady Statistic AS VS Percent: 2.18 %
Brady Statistic RA Percent Paced: 19.88 %
Brady Statistic RV Percent Paced: 97.81 %
Date Time Interrogation Session: 20260102042716
Implantable Lead Connection Status: 753985
Implantable Lead Connection Status: 753985
Implantable Lead Implant Date: 20250522
Implantable Lead Implant Date: 20250522
Implantable Lead Location: 753859
Implantable Lead Location: 753860
Implantable Lead Model: 3830
Implantable Lead Model: 5076
Implantable Pulse Generator Implant Date: 20250522
Lead Channel Impedance Value: 323 Ohm
Lead Channel Impedance Value: 361 Ohm
Lead Channel Impedance Value: 399 Ohm
Lead Channel Impedance Value: 513 Ohm
Lead Channel Pacing Threshold Amplitude: 0.625 V
Lead Channel Pacing Threshold Amplitude: 0.875 V
Lead Channel Pacing Threshold Pulse Width: 0.4 ms
Lead Channel Pacing Threshold Pulse Width: 0.4 ms
Lead Channel Sensing Intrinsic Amplitude: 2.875 mV
Lead Channel Sensing Intrinsic Amplitude: 2.875 mV
Lead Channel Sensing Intrinsic Amplitude: 5.25 mV
Lead Channel Sensing Intrinsic Amplitude: 5.25 mV
Lead Channel Setting Pacing Amplitude: 1.5 V
Lead Channel Setting Pacing Amplitude: 2 V
Lead Channel Setting Pacing Pulse Width: 0.4 ms
Lead Channel Setting Sensing Sensitivity: 1.2 mV
Zone Setting Status: 755011

## 2024-12-30 ENCOUNTER — Other Ambulatory Visit (HOSPITAL_COMMUNITY): Payer: Self-pay

## 2024-12-30 ENCOUNTER — Encounter: Payer: Self-pay | Admitting: Internal Medicine

## 2024-12-30 ENCOUNTER — Ambulatory Visit: Payer: Self-pay | Admitting: Internal Medicine

## 2024-12-30 ENCOUNTER — Ambulatory Visit: Admitting: Internal Medicine

## 2024-12-30 VITALS — BP 140/86 | HR 77 | Temp 98.5°F | Ht 65.0 in | Wt 174.0 lb

## 2024-12-30 DIAGNOSIS — I4819 Other persistent atrial fibrillation: Secondary | ICD-10-CM

## 2024-12-30 DIAGNOSIS — D508 Other iron deficiency anemias: Secondary | ICD-10-CM | POA: Diagnosis not present

## 2024-12-30 DIAGNOSIS — E039 Hypothyroidism, unspecified: Secondary | ICD-10-CM

## 2024-12-30 DIAGNOSIS — N1831 Chronic kidney disease, stage 3a: Secondary | ICD-10-CM | POA: Diagnosis not present

## 2024-12-30 DIAGNOSIS — I4892 Unspecified atrial flutter: Secondary | ICD-10-CM | POA: Diagnosis not present

## 2024-12-30 DIAGNOSIS — E785 Hyperlipidemia, unspecified: Secondary | ICD-10-CM

## 2024-12-30 DIAGNOSIS — Z0000 Encounter for general adult medical examination without abnormal findings: Secondary | ICD-10-CM | POA: Diagnosis not present

## 2024-12-30 DIAGNOSIS — L299 Pruritus, unspecified: Secondary | ICD-10-CM | POA: Insufficient documentation

## 2024-12-30 DIAGNOSIS — D6869 Other thrombophilia: Secondary | ICD-10-CM | POA: Diagnosis not present

## 2024-12-30 DIAGNOSIS — Z0001 Encounter for general adult medical examination with abnormal findings: Secondary | ICD-10-CM

## 2024-12-30 LAB — CBC WITH DIFFERENTIAL/PLATELET
Basophils Absolute: 0 K/uL (ref 0.0–0.1)
Basophils Relative: 0.7 % (ref 0.0–3.0)
Eosinophils Absolute: 0.1 K/uL (ref 0.0–0.7)
Eosinophils Relative: 2 % (ref 0.0–5.0)
HCT: 37.5 % (ref 36.0–46.0)
Hemoglobin: 12.7 g/dL (ref 12.0–15.0)
Lymphocytes Relative: 17.6 % (ref 12.0–46.0)
Lymphs Abs: 0.9 K/uL (ref 0.7–4.0)
MCHC: 33.8 g/dL (ref 30.0–36.0)
MCV: 89.2 fl (ref 78.0–100.0)
Monocytes Absolute: 0.3 K/uL (ref 0.1–1.0)
Monocytes Relative: 6.4 % (ref 3.0–12.0)
Neutro Abs: 3.9 K/uL (ref 1.4–7.7)
Neutrophils Relative %: 73.3 % (ref 43.0–77.0)
Platelets: 261 K/uL (ref 150.0–400.0)
RBC: 4.2 Mil/uL (ref 3.87–5.11)
RDW: 13.6 % (ref 11.5–15.5)
WBC: 5.4 K/uL (ref 4.0–10.5)

## 2024-12-30 LAB — TSH: TSH: 2.31 u[IU]/mL (ref 0.35–5.50)

## 2024-12-30 LAB — LIPID PANEL
Cholesterol: 144 mg/dL (ref 28–200)
HDL: 61.4 mg/dL
LDL Cholesterol: 69 mg/dL (ref 10–99)
NonHDL: 82.72
Total CHOL/HDL Ratio: 2
Triglycerides: 68 mg/dL (ref 10.0–149.0)
VLDL: 13.6 mg/dL (ref 0.0–40.0)

## 2024-12-30 LAB — BASIC METABOLIC PANEL WITH GFR
BUN: 24 mg/dL — ABNORMAL HIGH (ref 6–23)
CO2: 30 meq/L (ref 19–32)
Calcium: 9.7 mg/dL (ref 8.4–10.5)
Chloride: 102 meq/L (ref 96–112)
Creatinine, Ser: 0.98 mg/dL (ref 0.40–1.20)
GFR: 52.98 mL/min — ABNORMAL LOW
Glucose, Bld: 86 mg/dL (ref 70–99)
Potassium: 4.1 meq/L (ref 3.5–5.1)
Sodium: 138 meq/L (ref 135–145)

## 2024-12-30 LAB — HEPATIC FUNCTION PANEL
ALT: 11 U/L (ref 3–35)
AST: 18 U/L (ref 5–37)
Albumin: 4.2 g/dL (ref 3.5–5.2)
Alkaline Phosphatase: 58 U/L (ref 39–117)
Bilirubin, Direct: 0.1 mg/dL (ref 0.1–0.3)
Total Bilirubin: 0.6 mg/dL (ref 0.2–1.2)
Total Protein: 7.3 g/dL (ref 6.0–8.3)

## 2024-12-30 MED ORDER — LEVOTHYROXINE SODIUM 50 MCG PO TABS: 50.0000 ug | ORAL_TABLET | Freq: Every day | ORAL | 1 refills | Status: AC

## 2024-12-30 MED ORDER — COVID-19 MRNA VAC-TRIS(PFIZER) 30 MCG/0.3ML IM SUSY
0.3000 mL | PREFILLED_SYRINGE | Freq: Once | INTRAMUSCULAR | 0 refills | Status: AC
  Filled 2024-12-30: qty 0.3, 1d supply, fill #0

## 2024-12-30 NOTE — Progress Notes (Unsigned)
 "  Subjective:  Patient ID: [name], female    DOB: 1941-06-06  Age: 84 y.o. MRN: [id number]  CC: Annual Exam and Hypothyroidism   HPI [name] presents for a CPX and f/up ---    Discussed the use of AI scribe software for clinical note transcription with the patient, who gave verbal consent to proceed.  History of Present Illness [name] is an 84 year old female who presents for routine follow-up and review of recent diagnostic studies.  She has experienced significant weight loss, decreasing from 203 pounds in May to 174 pounds currently, attributed to dietary changes such as eliminating bread and late-night snacks. She continues to consume regular meals and perceives these changes positively.  She recently saw a cardiologist, who told her her heart and pacemaker were doing okay.  An MRI conducted in October due to previous findings of red blood cells revealed a small pancreatic lesion. A follow-up MRI has been advised for next year.  She received a flu shot in early November but has not received a recent COVID-19 vaccine. There was some confusion regarding her husband's COVID-19 vaccination at [facility].  No current symptoms of illness and reports regular bowel movements.     Outpatient Medications Prior to Visit  Medication Sig Dispense Refill   acetaminophen  (TYLENOL ) 325 MG tablet Take 1-2 tablets (325-650 mg total) by mouth every 4 (four) hours as needed for mild pain (pain score 1-3).     Calcium  Carbonate-Vit D-Min (CALCIUM  1200 PO) Take 1 tablet by mouth at bedtime.     carvedilol  (COREG ) 6.25 MG tablet Take 1 tablet (6.25 mg total) by mouth 2 (two) times daily with a meal. 180 tablet 3   flecainide  (TAMBOCOR ) 50 MG tablet Take 0.5 tablets (25 mg total) by mouth 2 (two) times daily. 90 tablet 3   magnesium  gluconate (MAGONATE) 500 MG tablet Take 500 mg by mouth daily.     Melatonin 10 MG TABS Take 1 tablet by mouth.      Potassium 99 MG TABS Take 1 tablet by mouth at bedtime.     rivaroxaban  (XARELTO ) 20 MG TABS tablet Take 1 tablet (20 mg total) by mouth daily with supper. 100 tablet 3   rosuvastatin  (CRESTOR ) 10 MG tablet Take 1 tablet (10 mg total) by mouth daily. 90 tablet 3   zolpidem  (AMBIEN ) 10 MG tablet Take 1 tablet (10 mg total) by mouth at bedtime as needed. for sleep 90 tablet 1   levothyroxine  (SYNTHROID ) 50 MCG tablet Take 1 tablet (50 mcg total) by mouth daily before breakfast. 90 tablet 1   No facility-administered medications prior to visit.    ROS Review of Systems  Objective:  BP (!) 140/86 (BP Location: Left Arm, Patient Position: Sitting, Cuff Size: Normal)   Pulse 77   Temp 98.5 F (36.9 C) (Temporal)   Ht 5' 5 (1.651 m)   Wt 174 lb (78.9 kg)   SpO2 99%   BMI 28.96 kg/m   BP Readings from Last 3 Encounters:  12/30/24 (!) 140/86  12/16/24 118/80  09/11/24 110/78    Wt Readings from Last 3 Encounters:  12/30/24 174 lb (78.9 kg)  12/16/24 177 lb (80.3 kg)  09/11/24 179 lb 12.8 oz (81.6 kg)    Physical Exam  Lab Results  Component Value Date   WBC 5.4 12/30/2024   HGB 12.7 12/30/2024   HCT 37.5 12/30/2024   PLT 261.0 12/30/2024   GLUCOSE 86 12/30/2024  CHOL 144 12/30/2024   TRIG 68.0 12/30/2024   HDL 61.40 12/30/2024   LDLDIRECT 127.1 07/08/2013   LDLCALC 69 12/30/2024   ALT 11 12/30/2024   AST 18 12/30/2024   NA 138 12/30/2024   K 4.1 12/30/2024   CL 102 12/30/2024   CREATININE 0.98 12/30/2024   BUN 24 (H) 12/30/2024   CO2 30 12/30/2024   TSH 2.31 12/30/2024   INR 1.4 (H) 07/03/2019   HGBA1C 5.5 03/13/2008   MICROALBUR 0.8 12/03/2006    Estimated Creatinine Clearance: 44.4 mL/min (by C-G formula based on SCr of 0.98 mg/dL).   Assessment & Plan:  Dyslipidemia, goal LDL below 100 -     Lipid panel; Future -     Basic metabolic panel with GFR; Future -     TSH; Future -     Hepatic function panel; Future  Acquired hypothyroidism -     Basic  metabolic panel with GFR; Future -     TSH; Future -     Levothyroxine  Sodium; Take 1 tablet (50 mcg total) by mouth daily before breakfast.  Dispense: 90 tablet; Refill: 1  Atrial flutter, paroxysmal (HCC) -     Basic metabolic panel with GFR; Future -     TSH; Future  Iron deficiency anemia secondary to inadequate dietary iron intake -     Basic metabolic panel with GFR; Future -     CBC with Differential/Platelet; Future  Encounter for general adult medical examination with abnormal findings -     COVID-19 mRNA Vac-TriS(Pfizer); Inject 0.3 mLs into the muscle once for 1 dose.  Dispense: 0.3 mL; Refill: 0  Hypercoagulable state due to persistent atrial fibrillation (HCC)  Stage 3a chronic kidney disease (HCC)     Follow-up: Return in about 6 months (around 06/29/2025).  Debby Molt, MD "

## 2024-12-30 NOTE — Patient Instructions (Signed)

## 2024-12-30 NOTE — Progress Notes (Signed)
 Remote PPM Transmission

## 2025-01-01 ENCOUNTER — Other Ambulatory Visit (HOSPITAL_COMMUNITY): Payer: Self-pay

## 2025-03-26 ENCOUNTER — Encounter

## 2025-03-27 ENCOUNTER — Encounter

## 2025-04-30 ENCOUNTER — Ambulatory Visit: Admitting: Cardiology

## 2025-06-25 ENCOUNTER — Encounter

## 2025-09-15 ENCOUNTER — Ambulatory Visit

## 2025-09-24 ENCOUNTER — Encounter

## 2025-12-24 ENCOUNTER — Encounter

## 2026-03-25 ENCOUNTER — Encounter
